# Patient Record
Sex: Female | Born: 1995 | Race: White | Hispanic: No | Marital: Married | Smoking: Never smoker
Health system: Southern US, Community
[De-identification: ages and names within clinical notes are randomized; demographics above are authoritative.]

## PROBLEM LIST (undated history)

## (undated) DIAGNOSIS — R112 Nausea with vomiting, unspecified: Secondary | ICD-10-CM

## (undated) DIAGNOSIS — F419 Anxiety disorder, unspecified: Secondary | ICD-10-CM

## (undated) DIAGNOSIS — R519 Headache, unspecified: Secondary | ICD-10-CM

## (undated) DIAGNOSIS — T7840XA Allergy, unspecified, initial encounter: Secondary | ICD-10-CM

## (undated) DIAGNOSIS — R51 Headache: Secondary | ICD-10-CM

## (undated) HISTORY — DX: Anxiety disorder, unspecified: F41.9

## (undated) HISTORY — DX: Headache, unspecified: R51.9

## (undated) HISTORY — DX: Headache: R51

## (undated) HISTORY — DX: Allergy, unspecified, initial encounter: T78.40XA

## (undated) HISTORY — PX: ANKLE ARTHODESIS W/ ARTHROSCOPY: SUR63

---
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
## 2014-01-15 ENCOUNTER — Emergency Department: Payer: Self-pay | Admitting: Emergency Medicine

## 2014-10-22 ENCOUNTER — Ambulatory Visit: Payer: Self-pay | Admitting: Physician Assistant

## 2015-02-15 ENCOUNTER — Ambulatory Visit: Payer: BC Managed Care – PPO | Admitting: Psychiatry

## 2015-02-22 ENCOUNTER — Ambulatory Visit (INDEPENDENT_AMBULATORY_CARE_PROVIDER_SITE_OTHER): Payer: BC Managed Care – PPO | Admitting: Psychiatry

## 2015-02-22 ENCOUNTER — Encounter: Payer: Self-pay | Admitting: Psychiatry

## 2015-02-22 VITALS — BP 124/84 | HR 91 | Ht 68.25 in | Wt 238.4 lb

## 2015-02-22 DIAGNOSIS — F411 Generalized anxiety disorder: Secondary | ICD-10-CM | POA: Diagnosis not present

## 2015-02-22 DIAGNOSIS — F909 Attention-deficit hyperactivity disorder, unspecified type: Secondary | ICD-10-CM

## 2015-02-22 DIAGNOSIS — F988 Other specified behavioral and emotional disorders with onset usually occurring in childhood and adolescence: Secondary | ICD-10-CM

## 2015-02-22 MED ORDER — FLUOXETINE HCL 10 MG PO CAPS: 10.0000 mg | ORAL_CAPSULE | ORAL | Status: DC

## 2015-02-22 MED ORDER — METHYLPHENIDATE HCL ER (OSM) 27 MG PO TBCR: 27.0000 mg | EXTENDED_RELEASE_TABLET | ORAL | Status: DC

## 2015-02-22 MED ORDER — METHYLPHENIDATE HCL ER 27 MG PO TB24: 27.0000 mg | ORAL_TABLET | Freq: Every day | ORAL | Status: DC

## 2015-02-22 NOTE — Progress Notes (Signed)
BH MD/PA/NP OP Progress Note  02/22/2015 1:32 PM Madison Mack  MRN:  [id number]  Subjective:   Patient is a 19 year old female who presented with her mother for follow-up appointment. She reported that she has been doing well and has been enjoying her summer. She went for be shipped for almost a week. She reported that she ran out of her Concerta 1 week ago as she has to scheduled her appointment. She  reported that she is unable to sleep well as her medications are not adjusted at this time. Her mother reported that she has been responding well to her current psycho topic medications. She did well in the school and got 2 B's and 3 A's. She is currently finishing her education in elementary school. She is very motivated and is planning to go back next months. she currently denied having any suicidal homicidal ideation or plans.     Chief Complaint:  Chief Complaint    Follow-up; Anxiety; ADHD     Visit Diagnosis:     ICD-9-CM ICD-10-CM   1. ADD (attention deficit disorder) 314.00 F90.9   2. Generalized anxiety disorder 300.02 F41.1     Past Medical History:  Past Medical History  Diagnosis Date  . ADHD (attention deficit hyperactivity disorder)     Past Surgical History  Procedure Laterality Date  . Ankle fracture surgery Right 04/08   Family History:  Family History  Problem Relation Age of Onset  . ADD / ADHD Father   . ADD / ADHD Maternal Aunt   . Alcohol abuse Maternal Aunt   . Drug abuse Maternal Aunt   . Anxiety disorder Maternal Aunt   . Alcohol abuse Maternal Uncle   . Drug abuse Maternal Uncle   . Anxiety disorder Paternal Grandfather   . Depression Paternal Grandfather   . Dementia Paternal Grandmother    Social History:  History   Social History  . Marital Status: Single    Spouse Name: N/A  . Number of Children: N/A  . Years of Education: N/A   Social History Main Topics  . Smoking status: Never Smoker   . Smokeless tobacco: Never Used  .  Alcohol Use: No  . Drug Use: No  . Sexual Activity: Not Currently   Other Topics Concern  . None   Social History Narrative  . None   Additional History:     Assessment:   Musculoskeletal: Strength & Muscle Tone: within normal limits Gait & Station: normal Patient leans: N/A  Psychiatric Specialty Exam: HPI  Review of Systems  Constitutional: Negative.   HENT: Negative.   Eyes: Negative.   Respiratory: Negative.   Cardiovascular: Negative.   Gastrointestinal: Negative.   Genitourinary: Negative.   Musculoskeletal: Negative.   Skin: Negative.   Neurological: Negative.   Endo/Heme/Allergies: Negative.   Psychiatric/Behavioral: Negative for suicidal ideas and substance abuse. The patient is nervous/anxious.     Blood pressure 124/84, pulse 91, height 5' 8.25" (1.734 m), weight 238 lb 6.4 oz (108.138 kg), last menstrual period 02/09/2015.Body mass index is 35.97 kg/(m^2).  General Appearance: Casual  Eye Contact:  Fair  Speech:  Clear and Coherent  Volume:  Normal  Mood:  Euthymic  Affect:  Congruent  Thought Process:  Coherent  Orientation:  Full (Time, Place, and Person)  Thought Content:  WDL  Suicidal Thoughts:  No  Homicidal Thoughts:  No  Memory:  Immediate;   Fair  Judgement:  Fair  Insight:  Fair  Psychomotor Activity:  Normal  Concentration:  Fair  Recall:  [employer]  Fund of Knowledge: Fair  Language: Fair  Akathisia:  No  Handed:  Ambidextrous  AIMS (if indicated):    Assets:  Communication Skills Desire for Improvement Physical Health Social Support  ADL's:  Intact  Cognition: WNL  Sleep:     Is the patient at risk to self?  No. Has the patient been a risk to self in the past 6 months?  No. Has the patient been a risk to self within the distant past?  No. Is the patient a risk to others?  No. Has the patient been a risk to others in the past 6 months?  No. Has the patient been a risk to others within the distant past?  No.  Current  Medications: No current outpatient prescriptions on file.   No current facility-administered medications for this visit.    Medical Decision Making:  Established Problem, Stable/Improving (1) and Review of Psycho-Social Stressors (1)  Treatment Plan Summary:Medication management  She was given 3 month supply of her prescriptions including Concerta and she will fill them on a monthly basis. She was given 90 day supply of Prozac. Patient will follow up in October or earlier depending on her symptoms.   More than 50% of the time spent in psychoeducation, counseling and coordination of care.    This note was generated in part or whole with voice recognition software. Voice regonition is usually quite accurate but there are transcription errors that can and very often do occur. I apologize for any typographical errors that were not detected and corrected.     Madison HaleUzma Arlanda Mack 02/22/2015, 1:32 PM

## 2015-02-28 ENCOUNTER — Telehealth: Payer: Self-pay

## 2015-02-28 NOTE — Telephone Encounter (Signed)
faxed request for prior authorization for [facility] on medication methyphenidate 27mg  was approved by insurance from 02-05-15 thur 02-26-16 aut # [id number]

## 2015-05-24 ENCOUNTER — Ambulatory Visit (INDEPENDENT_AMBULATORY_CARE_PROVIDER_SITE_OTHER): Payer: BC Managed Care – PPO | Admitting: Psychiatry

## 2015-05-24 ENCOUNTER — Encounter: Payer: Self-pay | Admitting: Psychiatry

## 2015-05-24 VITALS — BP 118/78 | HR 87 | Temp 97.4°F | Ht 68.5 in | Wt 238.0 lb

## 2015-05-24 DIAGNOSIS — F988 Other specified behavioral and emotional disorders with onset usually occurring in childhood and adolescence: Secondary | ICD-10-CM

## 2015-05-24 DIAGNOSIS — F909 Attention-deficit hyperactivity disorder, unspecified type: Secondary | ICD-10-CM

## 2015-05-24 MED ORDER — FLUOXETINE HCL 10 MG PO CAPS: 10.0000 mg | ORAL_CAPSULE | ORAL | Status: DC

## 2015-05-24 MED ORDER — METHYLPHENIDATE HCL ER (OSM) 27 MG PO TBCR: 27.0000 mg | EXTENDED_RELEASE_TABLET | ORAL | Status: DC
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
## 2015-07-19 ENCOUNTER — Ambulatory Visit (INDEPENDENT_AMBULATORY_CARE_PROVIDER_SITE_OTHER): Payer: BC Managed Care – PPO | Admitting: Psychiatry

## 2015-07-19 ENCOUNTER — Ambulatory Visit: Payer: BC Managed Care – PPO | Admitting: Psychiatry

## 2015-07-19 ENCOUNTER — Encounter: Payer: Self-pay | Admitting: Psychiatry

## 2015-07-19 VITALS — BP 108/80 | HR 103 | Temp 99.1°F | Ht 68.5 in | Wt 230.2 lb

## 2015-07-19 DIAGNOSIS — F419 Anxiety disorder, unspecified: Secondary | ICD-10-CM

## 2015-07-19 DIAGNOSIS — F909 Attention-deficit hyperactivity disorder, unspecified type: Secondary | ICD-10-CM

## 2015-07-19 DIAGNOSIS — F988 Other specified behavioral and emotional disorders with onset usually occurring in childhood and adolescence: Secondary | ICD-10-CM

## 2015-07-19 MED ORDER — FLUOXETINE HCL 10 MG PO CAPS: 10.0000 mg | ORAL_CAPSULE | ORAL | Status: DC

## 2015-07-19 MED ORDER — METHYLPHENIDATE HCL ER (OSM) 27 MG PO TBCR: 27.0000 mg | EXTENDED_RELEASE_TABLET | ORAL | Status: DC

## 2015-07-19 MED ORDER — METHYLPHENIDATE HCL ER (OSM) 27 MG PO TBCR
27.0000 mg | EXTENDED_RELEASE_TABLET | ORAL | Status: DC
Start: 2015-07-19 — End: 2015-09-28
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
## 2015-08-02 ENCOUNTER — Ambulatory Visit
Admission: EM | Admit: 2015-08-02 | Discharge: 2015-08-02 | Disposition: A | Discharge status: Home / Self Care | Payer: BC Managed Care – PPO | Attending: Family Medicine | Admitting: Family Medicine

## 2015-08-02 ENCOUNTER — Encounter: Payer: Self-pay | Admitting: Emergency Medicine

## 2015-08-02 ENCOUNTER — Emergency Department
Admission: EM | Admit: 2015-08-02 | Discharge: 2015-08-02 | Disposition: A | Discharge status: Home / Self Care | Payer: BC Managed Care – PPO | Attending: Emergency Medicine | Admitting: Emergency Medicine

## 2015-08-02 DIAGNOSIS — K529 Noninfective gastroenteritis and colitis, unspecified: Secondary | ICD-10-CM

## 2015-08-02 DIAGNOSIS — E860 Dehydration: Secondary | ICD-10-CM | POA: Diagnosis not present

## 2015-08-02 DIAGNOSIS — Z79899 Other long term (current) drug therapy: Secondary | ICD-10-CM | POA: Diagnosis not present

## 2015-08-02 DIAGNOSIS — Z880 Allergy status to penicillin: Secondary | ICD-10-CM | POA: Diagnosis not present

## 2015-08-02 DIAGNOSIS — Z3202 Encounter for pregnancy test, result negative: Secondary | ICD-10-CM | POA: Diagnosis not present

## 2015-08-02 DIAGNOSIS — R112 Nausea with vomiting, unspecified: Secondary | ICD-10-CM | POA: Diagnosis present

## 2015-08-02 DIAGNOSIS — R55 Syncope and collapse: Secondary | ICD-10-CM | POA: Diagnosis not present

## 2015-08-02 LAB — POCT PREGNANCY, URINE: PREG TEST UR: NEGATIVE

## 2015-08-02 LAB — URINALYSIS COMPLETE WITH MICROSCOPIC (ARMC ONLY)
BACTERIA UA: NONE SEEN
Bilirubin Urine: NEGATIVE
GLUCOSE, UA: NEGATIVE mg/dL
Hgb urine dipstick: NEGATIVE
Leukocytes, UA: NEGATIVE
Nitrite: NEGATIVE
PROTEIN: 100 mg/dL — AB
Specific Gravity, Urine: 1.03 (ref 1.005–1.030)
pH: 7 (ref 5.0–8.0)

## 2015-08-02 LAB — COMPREHENSIVE METABOLIC PANEL
ALT: 14 U/L (ref 14–54)
AST: 13 U/L — AB (ref 15–41)
Albumin: 4.3 g/dL (ref 3.5–5.0)
Alkaline Phosphatase: 91 U/L (ref 38–126)
Anion gap: 7 (ref 5–15)
BILIRUBIN TOTAL: 0.8 mg/dL (ref 0.3–1.2)
BUN: 16 mg/dL (ref 6–20)
CHLORIDE: 105 mmol/L (ref 101–111)
CO2: 21 mmol/L — ABNORMAL LOW (ref 22–32)
Calcium: 9.1 mg/dL (ref 8.9–10.3)
Creatinine, Ser: 0.74 mg/dL (ref 0.44–1.00)
GFR calc Af Amer: >60 mL/min (ref >60)
GLUCOSE: 105 mg/dL — AB (ref 65–99)
Potassium: 4 mmol/L (ref 3.5–5.1)
Sodium: 133 mmol/L — ABNORMAL LOW (ref 135–145)
Total Protein: 8.6 g/dL — ABNORMAL HIGH (ref 6.5–8.1)

## 2015-08-02 LAB — CBC
HEMATOCRIT: 40.4 % (ref 35.0–47.0)
HEMOGLOBIN: 12.7 g/dL (ref 12.0–16.0)
MCH: 23.8 pg — AB (ref 26.0–34.0)
MCHC: 31.5 g/dL — AB (ref 32.0–36.0)
MCV: 75.4 fL — ABNORMAL LOW (ref 80.0–100.0)
Platelets: 257 10*3/uL (ref 150–440)
RBC: 5.36 MIL/uL — ABNORMAL HIGH (ref 3.80–5.20)
RDW: 15.6 % — AB (ref 11.5–14.5)
WBC: 15.1 10*3/uL — ABNORMAL HIGH (ref 3.6–11.0)

## 2015-08-02 LAB — LIPASE, BLOOD: Lipase: 28 U/L (ref 11–51)

## 2015-08-02 MED ORDER — LOPERAMIDE HCL 2 MG PO TABS: 2.0000 mg | ORAL_TABLET | Freq: Four times a day (QID) | ORAL | Status: DC | PRN

## 2015-08-02 MED ORDER — SODIUM CHLORIDE 0.9 % IV BOLUS (SEPSIS)
1000.0000 mL | Freq: Once | INTRAVENOUS | Status: AC
  Administered 2015-08-02: 1000 mL via INTRAVENOUS

## 2015-08-02 MED ORDER — LOPERAMIDE HCL 2 MG PO CAPS
4.0000 mg | ORAL_CAPSULE | Freq: Once | ORAL | Status: AC
  Administered 2015-08-02: 4 mg via ORAL
  Filled 2015-08-02: qty 2

## 2015-08-02 MED ORDER — ONDANSETRON 4 MG PO TBDP: 4.0000 mg | ORAL_TABLET | Freq: Three times a day (TID) | ORAL | Status: DC | PRN

## 2015-08-02 NOTE — ED Notes (Signed)
Woke at 7am with diarrhea and vomiting. "Too many times to count" with vomiting and diarrhea. After drinking any fluids "goes right through". Also co intermittent sharp abdominal pain. Unable to assess orthostatics as felt syncopal. Color pale and skin cool and dry

## 2015-08-02 NOTE — Discharge Instructions (Signed)
Dehydration, Adult Dehydration means your body does not have as much fluid or water as it needs. It happens when you take in less fluid than you lose. Your kidneys, brain, and heart will not work properly without the right amount of fluids.  Dehydration can range from mild to severe. It should be treated right away to help prevent it from becoming severe. HOME CARE  Drink enough fluid to keep your pee (urine) clear or pale yellow.  Drink water or fluid slowly by taking small sips. You can also try sucking on ice cubes.  Have food or drinks that contain electrolytes. Examples include bananas and sports drinks.  Take over-the-counter and prescription medicines only as told by your doctor.  Prepare oral rehydration solution (ORS) according to the instructions that came with it. Take sips of ORS every 5 minutes until your pee returns to normal.  If you are throwing up (vomiting) or have watery poop (diarrhea), keep trying to drink water, ORS, or both.  If you have watery poop, avoid:  Drinks with caffeine.  Fruit juice.  Milk.  Carbonated soft drinks.  Do not take salt tablets. This can lead to having too much sodium in your body (hypernatremia). GET HELP IF:  You cannot eat or drink without throwing up.  You have had mild watery poop for longer than 24 hours.  You have a fever. GET HELP RIGHT AWAY IF:   You have very strong thirst.  You have very bad watery poop.  You have not peed in 6-8 hours, or you have peed only a small amount of very dark pee.  You have shriveled skin.  You are dizzy, confused, or both.   This information is not intended to replace advice given to you by your health care provider. Make sure you discuss any questions you have with your health care provider.   Document Released: 05/24/2009 Document Revised: 04/18/2015 Document Reviewed: 12/13/2014 Elsevier Interactive Patient Education 2016 [employer].  Near-Syncope Near-syncope (commonly  known as near fainting) is sudden weakness, dizziness, or feeling like you might pass out. This can happen when getting up or while standing for a long time. It is caused by a sudden decrease in blood flow to the brain, which can occur for various reasons. Most of the reasons are not serious.  HOME CARE Watch your condition for any changes.  Have someone stay with you until you feel stable.  If you feel like you are going to pass out:  Lie down right away.  Prop your feet up if you can.  Breathe deeply and steadily.  Move only when the feeling has gone away. Most of the time, this feeling lasts only a few minutes. You may feel tired for several hours.  Drink enough fluids to keep your pee (urine) clear or pale yellow.  If you are taking blood pressure or heart medicine, stand up slowly.  Follow up with your doctor as told. GET HELP RIGHT AWAY IF:   You have a severe headache.  You have unusual pain in the chest, belly (abdomen), or back.  You have bleeding from the mouth or butt (rectum), or you have black or tarry poop (stool).  You feel your heart beat differently than normal, or you have a very fast pulse.  You pass out, or you twitch and shake when you pass out.  You pass out when sitting or lying down.  You feel confused.  You have trouble walking.  You are weak.  You  have vision problems. MAKE SURE YOU:   Understand these instructions.  Will watch your condition.  Will get help right away if you are not doing well or get worse.   This information is not intended to replace advice given to you by your health care provider. Make sure you discuss any questions you have with your health care provider.   Document Released: 01/14/2008 Document Revised: 08/18/2014 Document Reviewed: 12/31/2012 Elsevier Interactive Patient Education [employer]2016 Elsevier Inc.

## 2015-08-02 NOTE — Discharge Instructions (Signed)
Syncope °Syncope means a person passes out (faints). The person usually wakes up in less than 5 minutes. It is important to seek medical care for syncope. °HOME CARE °· Have someone stay with you until you feel normal. °· Do not drive, use machines, or play sports until your doctor says it is okay. °· Keep all doctor visits as told. °· Lie down when you feel like you might pass out. Take deep breaths. Wait until you feel normal before standing up. °· Drink enough fluids to keep your pee (urine) clear or pale yellow. °· If you take blood pressure or heart medicine, get up slowly. Take several minutes to sit and then stand. °GET HELP RIGHT AWAY IF:  °· You have a severe headache. °· You have pain in the chest, belly (abdomen), or back. °· You are bleeding from the mouth or butt (rectum). °· You have black or tarry poop (stool). °· You have an irregular or very fast heartbeat. °· You have pain with breathing. °· You keep passing out, or you have shaking (seizures) when you pass out. °· You pass out when sitting or lying down. °· You feel confused. °· You have trouble walking. °· You have severe weakness. °· You have vision problems. °If you fainted, call for help (911 in [place] Do not drive yourself to the hospital. °  °This information is not intended to replace advice given to you by your health care provider. Make sure you discuss any questions you have with your health care provider. °  °Document Released: 01/14/2008 Document Revised: 12/12/2014 Document Reviewed: 09/26/2011 °Elsevier Interactive Patient Education ©2016 Elsevier Inc. ° °

## 2015-08-02 NOTE — ED Notes (Signed)
Pt sent over from [facility] with possible dehydration pt started having n/v/d this am.

## 2015-08-02 NOTE — ED Provider Notes (Signed)
CSN: [id number]     Arrival date & time 08/02/15  1532 History   First MD Initiated Contact with Patient 08/02/15 1630     Chief Complaint  Patient presents with  . Emesis  . Diarrhea   (Consider location/radiation/quality/duration/timing/severity/associated sxs/prior Treatment) HPI  Past Medical History  Diagnosis Date  . ADHD (attention deficit hyperactivity disorder)   . Depression    Past Surgical History  Procedure Laterality Date  . Ankle fracture surgery Right 04/08   Family History  Problem Relation Age of Onset  . Hyperlipidemia Father   . ADD / ADHD Maternal Aunt   . Alcohol abuse Maternal Aunt   . Drug abuse Maternal Aunt   . Anxiety disorder Maternal Aunt   . Alcohol abuse Maternal Uncle   . Drug abuse Maternal Uncle   . Anxiety disorder Paternal Grandfather   . Depression Paternal Grandfather   . Dementia Paternal Grandmother   . Hypertension Mother    Social History  Substance Use Topics  . Smoking status: Never Smoker   . Smokeless tobacco: Never Used  . Alcohol Use: No   OB History    No data available     Review of Systems  Allergies  Penicillins  Home Medications   Prior to Admission medications   Medication Sig Start Date End Date Taking? Authorizing Provider  FLUoxetine (PROZAC) 10 MG capsule Take 1 capsule (10 mg total) by mouth every morning. 07/19/15  Yes [name], MD  methylphenidate 27 MG PO CR tablet Take 1 tablet (27 mg total) by mouth every morning. 07/19/15  Yes [name], MD  methylphenidate 27 MG PO CR tablet Take 1 tablet (27 mg total) by mouth every morning. 07/19/15   [name], MD  ondansetron (ZOFRAN-ODT) 4 MG disintegrating tablet DIS ONE T PO Q 6 HOURS PRF NAUSEA 04/21/15   Historical Provider, MD   Meds Ordered and Administered this Visit  Medications - No data to display  BP 101/69 mmHg  Pulse 98  Temp(Src) 97.5 F (36.4 C) (Tympanic)  Resp 16  Ht 5' 7.5" (1.715 m)  Wt 228 lb (103.42 kg)  BMI 35.16 kg/m2   SpO2 100%  LMP 07/12/2015 (Exact Date) No data found.   Physical Exam  Constitutional: She is oriented to person, place, and time. She appears well-developed. She appears distressed.  HENT:  Head: Normocephalic and atraumatic.  Right Ear: External ear normal.  Left Ear: External ear normal.  Eyes: Conjunctivae are normal. Pupils are equal, round, and reactive to light.  Neck: Normal range of motion. Neck supple.  Cardiovascular: Normal rate, regular rhythm and normal heart sounds.   Pulmonary/Chest: Effort normal and breath sounds normal.  Abdominal: Soft. Bowel sounds are normal.  Musculoskeletal: Normal range of motion.  Neurological: She is alert and oriented to person, place, and time.  Skin: Skin is warm. She is diaphoretic.  Psychiatric: She has a normal mood and affect. Her behavior is normal.  Vitals reviewed.   ED Course  Procedures (including critical care time)  Labs Review Labs Reviewed - No data to display  Imaging Review No results found.   Visual Acuity Review  Right Eye Distance:   Left Eye Distance:   Bilateral Distance:    Right Eye Near:   Left Eye Near:    Bilateral Near:         MDM   1. Dehydration   2. Acute gastroenteritis    Patient with prominent orthostatic changes. Discussed mother that with abdominal  pain she's having a marked orthostatic changes as well will be limited we can do here. We'll give up to 2 L of fluid get lab work but if anything is found abnormal lab work is significant or after 2 L of fluid she's not improving we will then need to transfer [facility] for evaluation. Since it is a good chance that she will be transferred to [facility] ED later mother asked if she can just go on there now to be seen and evaluated.  Offered call EMS and patient requested EMS as well mother reroute her and told her that she will take her by car.    Hassan RowanEugene Myia Bergh, MD 08/02/15 [id number]
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
## 2015-08-02 NOTE — ED Provider Notes (Signed)
[facility] Emergency Department Provider Note  Time seen: 6:14 PM  I have reviewed the triage vital signs and the nursing notes.   HISTORY  Chief Complaint Nausea; Emesis; and Diarrhea    HPI Madison Mack is a 18 y.o. female with a past medical history of ADHD and depression who presents the emergency department with nausea, vomiting, diarrhea and syncopal episodes. According to the patient she awoke this morning with nausea and vomiting followed by profuse watery diarrhea. He continues to feel nauseated with vomiting at times, states constant diarrhea. Upon presentation to the emergency department the patient had 2 syncopal episodes and was brought back to a room immediately. Patient initially pale. Denies "pain" but states she has abdominal cramping. Feels nauseated. States this feels like when she had noro virus 2 years ago.     Past Medical History  Diagnosis Date  . ADHD (attention deficit hyperactivity disorder)   . Depression     There are no active problems to display for this patient.   Past Surgical History  Procedure Laterality Date  . Ankle fracture surgery Right 04/08    Current Outpatient Rx  Name  Route  Sig  Dispense  Refill  . FLUoxetine (PROZAC) 10 MG capsule   Oral   Take 1 capsule (10 mg total) by mouth every morning.   90 capsule   1   . methylphenidate 27 MG PO CR tablet   Oral   Take 1 tablet (27 mg total) by mouth every morning.   30 tablet   0     To be filled after 06/25/15   . omeprazole (PRILOSEC) 20 MG capsule   Oral   Take 20 mg by mouth daily.           Allergies Penicillins  Family History  Problem Relation Age of Onset  . Hyperlipidemia Father   . ADD / ADHD Maternal Aunt   . Alcohol abuse Maternal Aunt   . Drug abuse Maternal Aunt   . Anxiety disorder Maternal Aunt   . Alcohol abuse Maternal Uncle   . Drug abuse Maternal Uncle   . Anxiety disorder Paternal Grandfather   . Depression  Paternal Grandfather   . Dementia Paternal Grandmother   . Hypertension Mother     Social History Social History  Substance Use Topics  . Smoking status: Never Smoker   . Smokeless tobacco: Never Used  . Alcohol Use: No    Review of Systems Constitutional: Negative for fever Cardiovascular: Negative for chest pain. Respiratory: Negative for shortness of breath. Gastrointestinal: Positive for abdominal cramping, nausea, vomiting, diarrhea Genitourinary: Negative for dysuria. Neurological: Negative for headache 10-point ROS otherwise negative.  ____________________________________________   PHYSICAL EXAM:  VITAL SIGNS: ED Triage Vitals  Enc Vitals Group     BP 08/02/15 1705 113/60 mmHg     Pulse Rate 08/02/15 1705 121     Resp 08/02/15 1705 20     Temp 08/02/15 1705 98.1 F (36.7 C)     Temp Source 08/02/15 1705 Oral     SpO2 08/02/15 1705 100 %     Weight 08/02/15 1705 228 lb (103.42 kg)     Height 08/02/15 1705  (1.702 m)     Head Cir --      Peak Flow --      Pain Score 08/02/15 1706 5     Pain Loc --      Pain Edu? --      Excl.  in GC? --     Constitutional: Alert and oriented. Well appearing and in no distress. Eyes: Normal exam ENT   Head: Normocephalic and atraumatic.   Mouth/Throat: Mucous membranes are moist. Cardiovascular: Normal rate, regular rhythm. No murmur Respiratory: Normal respiratory effort without tachypnea nor retractions. Breath sounds are clear Gastrointestinal: Soft and nontender. No distention.  Musculoskeletal: Nontender with normal range of motion in all extremities.  Neurologic:  Normal speech and language. No gross focal neurologic deficits  Skin:  Skin is warm, dry and intact.  Psychiatric: Mood and affect are normal. Speech and behavior are normal.  ____________________________________________    EKG  EKG reviewed and interpreted by myself shows normal sinus rhythm at 94 bpm, narrow QRS, normal axis, normal  intervals, no ST changes. Overall normal EKG.  ____________________________________________   INITIAL IMPRESSION / ASSESSMENT AND PLAN / ED COURSE  Pertinent labs & imaging results that were available during my care of the patient were reviewed by me and considered in my medical decision making (see chart for details).  Patient presents with nausea, vomiting, diarrhea and syncopal episodes. Patient's history very suggestive of gastroenteritis such as norovirus. We'll check labs, IV hydrate, and treat nausea, treat diarrhea, and closer monitor in the emergency department. We will obtain an EKG. Nontender abdominal exam.  Labs show an elevated white blood cell count, otherwise largely within normal limits, again likely consistent with gastroenteritis. Patient is feeling much better status post nausea medication and fluids. We'll continue with IV hydration, treated with loperamide, and continue to monitor in the emergency department. Patient has just produced a urine sample for us to test.  Urine within normal limits, Prevacid as negative. Patient feels much better. We'll discharge with Zofran, loperamide as needed, and primary care follow-up. Patient agreeable to plan. ____________________________________________   FINAL CLINICAL IMPRESSION(S) / ED DIAGNOSES  Gastroenteritis Syncope  Minna AntisKevin Hannia Matchett, MD 08/02/15 2041

## 2015-08-07 NOTE — Progress Notes (Signed)
Refilled

## 2015-09-28 ENCOUNTER — Other Ambulatory Visit: Payer: Self-pay

## 2015-09-28 DIAGNOSIS — F988 Other specified behavioral and emotional disorders with onset usually occurring in childhood and adolescence: Secondary | ICD-10-CM

## 2015-09-28 NOTE — Telephone Encounter (Signed)
pt is almost out of her medication, pt will not have enought medication to last until her appt on  10-16-15.  pt is in college and will not be home until the week of march 6-10th.

## 2015-10-04 ENCOUNTER — Telehealth: Payer: Self-pay

## 2015-10-04 MED ORDER — FLUOXETINE HCL 10 MG PO CAPS: 10.0000 mg | ORAL_CAPSULE | ORAL | Status: DC

## 2015-10-04 MED ORDER — METHYLPHENIDATE HCL ER (OSM) 27 MG PO TBCR: 27.0000 mg | EXTENDED_RELEASE_TABLET | ORAL | Status: DC

## 2015-10-04 NOTE — Telephone Encounter (Signed)
rx for methylphenidate 27 mg was faxed and confirmed id # [id number] order # [id number]

## 2015-10-16 ENCOUNTER — Encounter: Payer: Self-pay | Admitting: Psychiatry

## 2015-10-16 ENCOUNTER — Ambulatory Visit (INDEPENDENT_AMBULATORY_CARE_PROVIDER_SITE_OTHER): Payer: BC Managed Care – PPO | Admitting: Psychiatry

## 2015-10-16 VITALS — BP 122/80 | HR 91 | Temp 98.3°F | Ht 67.0 in | Wt 224.8 lb

## 2015-10-16 DIAGNOSIS — F330 Major depressive disorder, recurrent, mild: Secondary | ICD-10-CM | POA: Diagnosis not present

## 2015-10-16 DIAGNOSIS — F988 Other specified behavioral and emotional disorders with onset usually occurring in childhood and adolescence: Secondary | ICD-10-CM

## 2015-10-16 DIAGNOSIS — F909 Attention-deficit hyperactivity disorder, unspecified type: Secondary | ICD-10-CM | POA: Diagnosis not present

## 2015-10-16 MED ORDER — METHYLPHENIDATE HCL ER (OSM) 27 MG PO TBCR: 27.0000 mg | EXTENDED_RELEASE_TABLET | ORAL | Status: DC

## 2015-10-16 MED ORDER — FLUOXETINE HCL 10 MG PO CAPS: 10.0000 mg | ORAL_CAPSULE | ORAL | Status: DC

## 2015-10-16 NOTE — Progress Notes (Signed)
BH MD/PA/NP OP Progress Note  10/16/2015 9:03 AM Madison Mack  MRN:  [id number]  Subjective:  Patient is a 20 year old female who presented for the follow-up. She reported that she would engage in January and had met the man a month before her engagement. She reported that he has finished high school and she is very excited. They're planning to get married next year after her graduation from her college. She reported that her mother was surprised as she thinks that she is going too fast. She reported that she is doing well in her school and has been getting all A's. 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 [place]. Patient appeared calm and collected during the interview. She reported that she has been compliant with her medications and is not experiencing any adverse effects.  She has been sleeping well. She denied having any mood symptoms anger anxiety or paranoia.     Chief Complaint:  Chief Complaint    Follow-up; Medication Refill     Visit Diagnosis:     ICD-9-CM ICD-10-CM   1. 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 ADHD (attention deficit hyperactivity disorder)   . Depression     Past Surgical History  Procedure Laterality Date  . Ankle fracture surgery Right 04/08   Family History:  Family History  Problem Relation Age of Onset  . Hyperlipidemia Father   . ADD / ADHD Maternal Aunt   . Alcohol abuse Maternal Aunt   . Drug abuse Maternal Aunt   . Anxiety disorder Maternal Aunt   . Alcohol abuse Maternal Uncle   . Drug abuse Maternal Uncle   . Anxiety disorder Paternal Grandfather   . Depression Paternal Grandfather   . Dementia Paternal Grandmother   . Hypertension Mother    Social History:  Social History   Social History  .  Marital Status: Single    Spouse Name: N/A  . Number of Children: N/A  . Years of Education: N/A   Social History Main Topics  . Smoking status: Never Smoker   . Smokeless tobacco: Never Used  . Alcohol Use: No  . Drug Use: No  . Sexual Activity: Not Currently   Other Topics Concern  . None   Social History Narrative   Additional History: Patient currently lives with her family. She is currently a Ship broker at [employer].  Assessment:     Psychiatric Specialty Exam: HPI   ROS   Blood pressure 122/80, pulse 91, temperature 98.3 F (36.8 C), temperature source Tympanic, height 5' 7"  (1.702 m), weight 224 lb 12.8 oz (101.969 kg), last menstrual period 10/02/2015, SpO2 97 %.Body mass index is 35.2 kg/(m^2).  General Appearance: Casual  Eye Contact:  Fair  Speech:  Clear and Coherent and Normal Rate  Volume:  Normal  Mood:  Euthymic  Affect:  Congruent  Thought Process:  Coherent and Goal Directed  Orientation:  Full (Time, Place, and Person)  Thought Content:  WDL  Suicidal Thoughts:  No  Homicidal Thoughts:  No  Memory:  Immediate;   Good Recent;   Good  Judgement:  Good  Insight:  Good  Psychomotor Activity:  Normal  Concentration:  Good  Recall:  Good  Fund of Knowledge: Good  Language: Good  Akathisia:  No  Handed:  Right  AIMS (if indicated):    Assets:  Communication Skills Desire for Improvement Physical Health Social Support Talents/Skills  ADL's:  Intact  Cognition: WNL  Sleep:       Current Medications: Current Outpatient Prescriptions  Medication Sig Dispense Refill  . FLUoxetine (PROZAC) 10 MG capsule Take 1 capsule (10 mg total) by mouth every morning. 90 capsule 0  . methylphenidate 27 MG PO CR tablet Take 1 tablet (27 mg total) by mouth every morning. 30 tablet 0   No current facility-administered medications for this visit.    Medical Decision Making:  Established Problem, Stable/Improving (1)  Treatment Plan Summary:Medication  management   Anxiety Patient will continue on Prozac 10 mg by mouth daily  ADD She will continue on Concerta 27 mg 1 pill in the morning. 2 month supply of the medication was given  Follow-up 2 months or earlier depending on her symptoms   More than 50% of the time spent in psychoeducation, counseling and coordination of care.  Time spent with the patient 25 minutes    This note was generated in part or whole with voice recognition software. Voice regonition is usually quite accurate but there are transcription errors that can and very often do occur. I apologize for any typographical errors that were not detected and corrected.     Rainey Pines, MD  10/16/2015, 9:03 AM

## 2016-01-09 ENCOUNTER — Encounter: Payer: Self-pay | Admitting: Psychiatry

## 2016-01-09 ENCOUNTER — Ambulatory Visit (INDEPENDENT_AMBULATORY_CARE_PROVIDER_SITE_OTHER): Payer: BC Managed Care – PPO | Admitting: Psychiatry

## 2016-01-09 VITALS — BP 110/82 | HR 96 | Temp 98.5°F | Ht 67.0 in | Wt 224.2 lb

## 2016-01-09 DIAGNOSIS — F988 Other specified behavioral and emotional disorders with onset usually occurring in childhood and adolescence: Secondary | ICD-10-CM

## 2016-01-09 DIAGNOSIS — F909 Attention-deficit hyperactivity disorder, unspecified type: Secondary | ICD-10-CM

## 2016-01-09 DIAGNOSIS — F330 Major depressive disorder, recurrent, mild: Secondary | ICD-10-CM | POA: Diagnosis not present

## 2016-01-09 MED ORDER — METHYLPHENIDATE HCL ER (OSM) 27 MG PO TBCR: 27.0000 mg | EXTENDED_RELEASE_TABLET | ORAL | Status: DC

## 2016-01-09 NOTE — Progress Notes (Signed)
BH MD/PA/NP OP Progress Note  01/09/2016 4:02 PM Madison Mack  MRN:  [id number]  Subjective:  Patient is a 20 year old female who presented for the follow-up. She reported that she has finished her school and now she is just going to start working full-time in [employer]. Patient reported that she has been doing well. She has been compliant with her medication. She was not able to sleep well last night. She reported that she will be a senior next year and is also going to prepare for her wedding. She will get married after finishing her school. She currently denied having any side effects from her medications. She appeared happy and calm during the interview. She denied having any perceptual disturbances.  She has been sleeping well. She denied having any mood symptoms anger anxiety or paranoia.     Chief Complaint:  Chief Complaint    Follow-up; Medication Refill     Visit Diagnosis:     ICD-9-CM ICD-10-CM   1. 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 ADHD (attention deficit hyperactivity disorder)   . Depression     Past Surgical History  Procedure Laterality Date  . Ankle fracture surgery Right 04/08   Family History:  Family History  Problem Relation Age of Onset  . Hyperlipidemia Father   . ADD / ADHD Maternal Aunt   . Alcohol abuse Maternal Aunt   . Drug abuse Maternal Aunt   . Anxiety disorder Maternal Aunt   . Alcohol abuse Maternal Uncle   . Drug abuse Maternal Uncle   . Anxiety disorder Paternal Grandfather   . Depression Paternal Grandfather   . Dementia Paternal Grandmother   . Hypertension Mother    Social History:  Social History   Social History  . Marital Status: Single    Spouse Name: N/A  . Number of Children: N/A  . Years of Education: N/A   Social History Main Topics  . Smoking status: Never Smoker   . Smokeless tobacco: Never Used  . Alcohol Use:  No  . Drug Use: No  . Sexual Activity: Not Currently   Other Topics Concern  . None   Social History Narrative   Additional History: Patient currently lives with her family. 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.  Assessment:     Psychiatric Specialty Exam: HPI  ROS  Blood pressure 110/82, pulse 96, temperature 98.5 F (36.9 C), temperature source Tympanic, height 5\' 7"  (1.702 m), weight 224 lb 3.2 oz (101.696 kg), last menstrual period 01/02/2016, SpO2 97 %.Body mass index is 35.11 kg/(m^2).  General Appearance: Casual  Eye Contact:  Fair  Speech:  Clear and Coherent and Normal Rate  Volume:  Normal  Mood:  Euthymic  Affect:  Congruent  Thought Process:  Coherent and Goal Directed  Orientation:  Full (Time, Place, and Person)  Thought Content:  WDL  Suicidal Thoughts:  No  Homicidal Thoughts:  No  Memory:  Immediate;   Good Recent;   Good  Judgement:  Good  Insight:  Good  Psychomotor Activity:  Normal  Concentration:  Good  Recall:  Good  Fund of Knowledge: Good  Language: Good  Akathisia:  No  Handed:  Right  AIMS (if indicated):    Assets:  Communication Skills Desire for Improvement Physical Health Social Support Talents/Skills  ADL's:  Intact  Cognition: WNL  Sleep:  Current Medications: Current Outpatient Prescriptions  Medication Sig Dispense Refill  . FLUoxetine (PROZAC) 10 MG capsule Take 1 capsule (10 mg total) by mouth every morning. 90 capsule 1  . methylphenidate 27 MG PO CR tablet Take 1 tablet (27 mg total) by mouth every morning. 30 tablet 0  . methylphenidate 27 MG PO CR tablet Take 1 tablet (27 mg total) by mouth every morning. 30 tablet 0   No current facility-administered medications for this visit.    Medical Decision Making:  Established Problem, Stable/Improving (1)  Treatment Plan Summary:Medication management   Anxiety Patient will continue on Prozac 10 mg by mouth daily  ADD She will continue on Concerta 27  mg 1 pill in the morning. 2 month supply of the medication was given  Follow-up 3 months or earlier depending on her symptoms   More than 50% of the time spent in psychoeducation, counseling and coordination of care.  Time spent with the patient 25 minutes    This note was generated in part or whole with voice recognition software. Voice regonition is usually quite accurate but there are transcription errors that can and very often do occur. I apologize for any typographical errors that were not detected and corrected.     [name], MD  01/09/2016, 4:02 PM

## 2016-03-10 ENCOUNTER — Encounter: Payer: Self-pay | Admitting: Psychiatry

## 2016-03-10 ENCOUNTER — Ambulatory Visit (INDEPENDENT_AMBULATORY_CARE_PROVIDER_SITE_OTHER): Payer: BC Managed Care – PPO | Admitting: Psychiatry

## 2016-03-10 VITALS — BP 127/81 | HR 84 | Temp 99.1°F | Ht 67.0 in | Wt 228.4 lb

## 2016-03-10 DIAGNOSIS — F330 Major depressive disorder, recurrent, mild: Secondary | ICD-10-CM

## 2016-03-10 MED ORDER — FLUOXETINE HCL 10 MG PO CAPS: 10.0000 mg | ORAL_CAPSULE | Freq: Every day | ORAL | 1 refills | Status: DC

## 2016-03-10 NOTE — Progress Notes (Signed)
BH MD/PA/NP OP Progress Note  03/10/2016 10:04 AM Madison Mack  MRN:  [id number]  Subjective:  Patient is a 20 year old female who currently [redacted] weeks pregnant presented for follow-up. She reported that she found out 2 weeks ago that she is pregnant and her parents were upset. However her mother is fine now with her pregnancy. Stopped taking the medications 2 weeks ago and she wants to discuss about her medications. She reported that she feels tired in the morning. She is doing well in her pregnancy and will have her first prenatal appointment in a week. She reported that she is excited about her pregnancy and is looking forward to have her first baby in April and will get married in May. She is also in her senior year of school and will finish can graduate in May. She has her plans for the next year. She appeared calm and alert during the interview. She reported that she has gained 3 pounds so far. She denied having any suicidal ideations or plans. She reported that she is feeling well and is sleeping good at night. We discussed about the medications. She wants to restart the Prozac at this time.   She  denied having any paranoia or anxiety or depressive symptoms.     Chief Complaint:  Chief Complaint    Medication Problem; Other     Visit Diagnosis:     ICD-9-CM ICD-10-CM   1. 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 ADHD (attention deficit hyperactivity disorder)   . Depression     Past Surgical History:  Procedure Laterality Date  . ANKLE FRACTURE SURGERY Right 04/08   Family History:  Family History  Problem Relation Age of Onset  . Hyperlipidemia Father   . Hypertension Mother   . ADD / ADHD Maternal Aunt   . Alcohol abuse Maternal Aunt   . Drug abuse Maternal Aunt   . Anxiety disorder Maternal Aunt   . Alcohol abuse Maternal Uncle   . Drug abuse Maternal Uncle   . Anxiety  disorder Paternal Grandfather   . Depression Paternal Grandfather   . Dementia Paternal Grandmother    Social History:  Social History   Social History  . Marital status: Single    Spouse name: N/A  . Number of children: N/A  . Years of education: N/A   Social History Main Topics  . Smoking status: Never Smoker  . Smokeless tobacco: Never Used  . Alcohol use No  . Drug use: No  . Sexual activity: Yes   Other Topics Concern  . None   Social History Narrative  . None   Additional History: Patient currently lives with her family. She is currently a Consulting civil engineer at [employer].  Assessment:     Psychiatric Specialty Exam: HPI  ROS  Blood pressure 127/81, pulse 84, temperature 99.1 F (37.3 C), temperature source Oral, height 5\' 7"  (1.702 m), weight 228 lb 6.4 oz (103.6 kg), last menstrual period 02/01/2016.Body mass index is 35.77 kg/m.  General Appearance: Casual  Eye Contact:  Fair  Speech:  Clear and Coherent and Normal Rate  Volume:  Normal  Mood:  Euthymic  Affect:  Congruent  Thought Process:  Coherent and Goal Directed  Orientation:  Full (Time, Place, and Person)  Thought Content:  WDL  Suicidal Thoughts:  No  Homicidal Thoughts:  No  Memory:  Immediate;  Good Recent;   Good  Judgement:  Good  Insight:  Good  Psychomotor Activity:  Normal  Concentration:  Good  Recall:  Good  Fund of Knowledge: Good  Language: Good  Akathisia:  No  Handed:  Right  AIMS (if indicated):    Assets:  Communication Skills Desire for Improvement Physical Health Social Support Talents/Skills  ADL's:  Intact  Cognition: WNL  Sleep:       Current Medications: Current Outpatient Prescriptions  Medication Sig Dispense Refill  . FLUoxetine (PROZAC) 10 MG capsule Take 1 capsule (10 mg total) by mouth daily. 90 capsule 1   No current facility-administered medications for this visit.     Medical Decision Making:  Established Problem, Stable/Improving (1)  Treatment  Plan Summary:Medication management   Anxiety Patient will continue on Prozac 10 mg by mouth daily  I will discontinue Concerta.  She will follow-up in 2 months or earlier depending on her symptoms     More than 50% of the time spent in psychoeducation, counseling and coordination of care.  Time spent with the patient 25 minutes    This note was generated in part or whole with voice recognition software. Voice regonition is usually quite accurate but there are transcription errors that can and very often do occur. I apologize for any typographical errors that were not detected and corrected.     [name], MD  03/10/2016, 10:04 AM

## 2016-04-21 LAB — HM HIV SCREENING LAB: HM HIV Screening: NEGATIVE

## 2017-06-29 ENCOUNTER — Encounter: Payer: Self-pay | Admitting: Psychiatry

## 2017-06-29 ENCOUNTER — Other Ambulatory Visit: Payer: Self-pay

## 2017-06-29 ENCOUNTER — Ambulatory Visit (INDEPENDENT_AMBULATORY_CARE_PROVIDER_SITE_OTHER): Payer: BC Managed Care – PPO | Admitting: Psychiatry

## 2017-06-29 VITALS — BP 125/83 | HR 80 | Temp 98.4°F | Wt 266.6 lb

## 2017-06-29 DIAGNOSIS — F330 Major depressive disorder, recurrent, mild: Secondary | ICD-10-CM

## 2017-06-29 DIAGNOSIS — F900 Attention-deficit hyperactivity disorder, predominantly inattentive type: Secondary | ICD-10-CM | POA: Diagnosis not present

## 2017-06-29 MED ORDER — FLUOXETINE HCL 10 MG PO CAPS: 10.0000 mg | ORAL_CAPSULE | Freq: Every day | ORAL | 2 refills | Status: DC

## 2017-06-29 MED ORDER — METHYLPHENIDATE HCL ER (OSM) 27 MG PO TBCR: 27.0000 mg | EXTENDED_RELEASE_TABLET | ORAL | 0 refills | Status: DC

## 2017-06-29 NOTE — Progress Notes (Signed)
Psychiatric Initial Adult Assessment   Patient Identification: Madison Mack MRN:  [id number] Date of Evaluation:  06/29/2017 Referral Source: Previous Pt Chief Complaint:   Chief Complaint    Follow-up     Visit Diagnosis:    ICD-10-CM   1. MDD (major depressive disorder), recurrent episode, mild ([place]) F33.0   2. Attention deficit hyperactivity disorder (ADHD), predominantly inattentive type F90.0     History of Present Illness:    Patient is a 21 year old married female who presented for initial assessment. She was previously seen in July 2017. She reported that she got married in March 03 and had a baby on March 15. She reported that she is currently working full-time as a third Land and her mother is helping her with her 39 month old son. She reported that she is stressed out due to her job and coming back from work and trying to take care of the baby. Her husband is also very supportive. Patient reported that she was taking the Prozac in the past which was very helpful. She met her husband on line. She reported that she feels that she needs to restart taking her medications again as she is unable to focus at work. Patient currently denied having any suicidal ideations or plans.  She reported that she is currently living with her parents as well as with her husband and her son. Her husband works in [employer]rop[employer] and they are trying to move into a trailer home with their son. Patient stated that she sleeps well at night. Her son does not wake up at night. She stated that she is planning to have more kids but not at this time.  Patient was receptive during the interview. She appeared calm and alert. She denied using any drugs or alcohol. She denied having any suicidal homicidal ideations or plans.    Associated Signs/Symptoms: Depression Symptoms:  fatigue, difficulty concentrating, panic attacks, loss of energy/fatigue, (Hypo) Manic Symptoms:  Irritable  Mood, Labiality of Mood, Anxiety Symptoms:  Excessive Worry, anxiety, rash due to stress  Psychotic Symptoms:  none PTSD Symptoms: Negative NA  Past Psychiatric History: h/o Depression and ADHD  Previous Psychotropic Medications:  Prozac Concerta  Substance Abuse History in the last 12 months:  No.  Consequences of Substance Abuse: Negative NA  Past Medical History:  Past Medical History:  Diagnosis Date  . ADHD (attention deficit hyperactivity disorder)   . Depression     Past Surgical History:  Procedure Laterality Date  . ANKLE FRACTURE SURGERY Right 04/08    Family Psychiatric History:  H/o depression and anxiety  Family History:  Family History  Problem Relation Age of Onset  . Hyperlipidemia Father   . Hypertension Mother   . ADD / ADHD Maternal Aunt   . Alcohol abuse Maternal Aunt   . Drug abuse Maternal Aunt   . Anxiety disorder Maternal Aunt   . Alcohol abuse Maternal Uncle   . Drug abuse Maternal Uncle   . Anxiety disorder Paternal Grandfather   . Depression Paternal Grandfather   . Dementia Paternal Grandmother     Social History:   Social History   Socioeconomic History  . Marital status: Married    Spouse name: thomas  . Number of children: 1  . Years of education: None  . Highest education level: Bachelor's degree (e.g., BA, AB, BS)  Social Needs  . Financial resource strain: Not hard at all  . Food insecurity - worry: Never true  . Food  insecurity - inability: Never true  . Transportation needs - medical: No  . Transportation needs - non-medical: No  Occupational History  . Occupation: [employer]    Comment: full time  Tobacco Use  . Smoking status: Never Smoker  . Smokeless tobacco: Never Used  Substance and Sexual Activity  . Alcohol use: No    Alcohol/week: 0.0 oz  . Drug use: No  . Sexual activity: Yes  Other Topics Concern  . None  Social History Narrative  . None    Additional Social History:  Married. Has a  98 month old son.   Allergies:   Allergies  Allergen Reactions  . Penicillins Rash    Has patient had a PCN reaction causing immediate rash, facial/tongue/throat swelling, SOB or lightheadedness with hypotension: SWELLING FEET AND HANDS, HIVES Has patient had a PCN reaction causing severe rash involving mucus membranes or skin necrosis: NO Has patient had a PCN reaction that required hospitalization: NO Has patient had a PCN reaction occurring within the last 10 years: NO If all of the above answers are "NO", then may proceed with Cephalosporin use.     Metabolic Disorder Labs: No results found for: HGBA1C, MPG No results found for: PROLACTIN No results found for: CHOL, TRIG, HDL, CHOLHDL, VLDL, LDLCALC   Current Medications: Current Outpatient Medications  Medication Sig Dispense Refill  . FLUoxetine (PROZAC) 10 MG capsule Take 1 capsule (10 mg total) daily by mouth. 30 capsule 2  . methylphenidate (CONCERTA) 27 MG PO CR tablet Take 1 tablet (27 mg total) every morning by mouth. 30 tablet 0   No current facility-administered medications for this visit.     Neurologic: Headache: No Seizure: No Paresthesias:No  Musculoskeletal: Strength & Muscle Tone: within normal limits Gait & Station: normal Patient leans: N/A  Psychiatric Specialty Exam: Review of Systems  Neurological: Positive for headaches.  Psychiatric/Behavioral: Positive for depression.    Blood pressure 125/83, pulse 80, temperature 98.4 F (36.9 C), temperature source Oral, weight 266 lb 9.6 oz (120.9 kg), unknown if currently breastfeeding.Body mass index is 41.76 kg/m.  General Appearance: Casual  Eye Contact:  Fair  Speech:  Clear and Coherent  Volume:  Normal  Mood:  Euthymic  Affect:  Congruent  Thought Process:  Coherent and Goal Directed  Orientation:  Full (Time, Place, and Person)  Thought Content:  WDL  Suicidal Thoughts:  No  Homicidal Thoughts:  No  Memory:  Immediate;   Fair Recent;    Fair Remote;   Fair  Judgement:  Fair  Insight:  Fair  Psychomotor Activity:  Normal  Concentration:  Concentration: Fair and Attention Span: Fair  Recall:  [employer] of Knowledge:Fair  Language: Fair  Akathisia:  No  Handed:  Right  AIMS (if indicated):    Assets:  Communication Skills Desire for Improvement Physical Health  ADL's:  Intact  Cognition: WNL  Sleep:      Treatment Plan Summary: Medication management    I will restart her on Prozac 10 mg by mouth daily. I will start her on Concerta 27 mg by mouth daily. Discussed with her about the medications and she agreed with the plan.   More than 50% of the time spent in psychoeducation, counseling and coordination of care.    This note was generated in part or whole with voice recognition software. Voice regonition is usually quite accurate but there are transcription errors that can and very often do occur. I apologize for any typographical  errors that were not detected and corrected.      Rainey Pines, MD 11/19/20183:41 PM

## 2017-07-20 ENCOUNTER — Ambulatory Visit: Payer: BC Managed Care – PPO | Admitting: Psychiatry

## 2017-08-10 ENCOUNTER — Other Ambulatory Visit: Payer: Self-pay

## 2017-08-10 ENCOUNTER — Encounter: Payer: Self-pay | Admitting: Psychiatry

## 2017-08-10 ENCOUNTER — Ambulatory Visit (INDEPENDENT_AMBULATORY_CARE_PROVIDER_SITE_OTHER): Payer: BC Managed Care – PPO | Admitting: Psychiatry

## 2017-08-10 VITALS — BP 118/81 | HR 85 | Temp 98.4°F | Ht 68.0 in | Wt 264.8 lb

## 2017-08-10 DIAGNOSIS — F411 Generalized anxiety disorder: Secondary | ICD-10-CM

## 2017-08-10 DIAGNOSIS — F900 Attention-deficit hyperactivity disorder, predominantly inattentive type: Secondary | ICD-10-CM

## 2017-08-10 DIAGNOSIS — F330 Major depressive disorder, recurrent, mild: Secondary | ICD-10-CM

## 2017-08-10 MED ORDER — METHYLPHENIDATE HCL ER (OSM) 36 MG PO TBCR: 36.0000 mg | EXTENDED_RELEASE_TABLET | ORAL | 0 refills | Status: DC

## 2017-08-10 MED ORDER — FLUOXETINE HCL 20 MG PO CAPS: 20.0000 mg | ORAL_CAPSULE | Freq: Every day | ORAL | 2 refills | Status: DC

## 2017-08-10 NOTE — Progress Notes (Signed)
BH MD OP Progress Note  08/10/2017 4:38 PM Madison Mack  MRN:  [id number]  Chief Complaint: ' I am anxious and needs help to focus.'  Chief Complaint    Follow-up; Medication Refill     HPI: Madison Mack is a 21 year old Caucasian female, married, who lives in [place], has a history of ADHD, depression, anxiety, presented to the clinic today for a follow-up visit.  Madison Mack today reports that she feels her Concerta at this dose is not really helpful.  She feels like  the effect has been wearing off towards the end of the day.  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  She wonders whether increasing her dose will be helpful.  She denies any side effects to the Concerta.  She denies any extreme anxiety issues, restlessness, sleep issues or decreased appetite at this time.  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  She worries about everything to the extreme and that has been she has been all her life.  She reports her Prozac has been helpful but she is okay with increasing the dose.  She not breast-feeding at this time.  She also reports her depressive symptoms have been better on the Prozac at this dose.  She denies any sleep issues.  She denies any suicidality or homicidality.  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  Her husband continues to work at [employer].  She reports she does not get a lot of time to spend with her husband because their schedules are different.  She worries about that.  She however enjoys her time with her 4751-month-old boy ' Madison Mack.'  Parents continues to be supportive.   Visit Diagnosis:    ICD-10-CM   1. Attention deficit hyperactivity disorder (ADHD), predominantly inattentive type F90.0 methylphenidate (CONCERTA) 36 MG PO CR tablet  2. MDD (major depressive disorder), recurrent episode, mild ([facility]) F33.0  FLUoxetine (PROZAC) 20 MG capsule  3. GAD (generalized anxiety disorder) F41.1     Past Psychiatric History: Denies history of suicide attempts.  She denies any history of inpatient mental health admission.  She used to follow up with Dr. Garnetta BuddyFaheem here in clinic.  Past Medical History:  Past Medical History:  Diagnosis Date  . ADHD (attention deficit hyperactivity disorder)   . Depression     Past Surgical History:  Procedure Laterality Date  . ANKLE FRACTURE SURGERY Right 04/08    Family Psychiatric History: Paternal grandfather-depression, maternal aunt-ADHD, alcohol abuse, drug abuse, anxiety disorder, maternal uncle-alcohol abuse, maternal uncle-drug abuse.  Family History:  Family History  Problem Relation Age of Onset  . Hyperlipidemia Father   . Hypertension Mother   . ADD / ADHD Maternal Aunt   . Alcohol abuse Maternal Aunt   . Drug abuse Maternal Aunt   . Anxiety disorder Maternal Aunt   . Alcohol abuse Maternal Uncle   . Drug abuse Maternal Uncle   . Anxiety disorder Paternal Grandfather   . Depression Paternal Grandfather   . Dementia Paternal Grandmother     Social History: She lives in [place] with her husband and her 3051-month-old baby.  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  She has good relationship with her parents.  Has a  bachelor's degree from [employer]. Social History   Socioeconomic History  . Marital  status: Married    Spouse name: thomas  . Number of children: 1  . Years of education: None  . Highest education level: Bachelor's degree (e.g., BA, AB, BS)  Social Needs  . Financial resource strain: Not hard at all  . Food insecurity - worry: Never true  . Food insecurity - inability: Never true  . Transportation needs - medical: No  . Transportation needs - non-medical: No  Occupational History  . Occupation: [employer]    Comment: full time  Tobacco Use  . Smoking status: Never Smoker  . Smokeless tobacco: Never Used  Substance and  Sexual Activity  . Alcohol use: No    Alcohol/week: 0.0 oz  . Drug use: No  . Sexual activity: Yes  Other Topics Concern  . None  Social History Narrative  . None    Allergies:  Allergies  Allergen Reactions  . Penicillins Rash    Has patient had a PCN reaction causing immediate rash, facial/tongue/throat swelling, SOB or lightheadedness with hypotension: SWELLING FEET AND HANDS, HIVES Has patient had a PCN reaction causing severe rash involving mucus membranes or skin necrosis: NO Has patient had a PCN reaction that required hospitalization: NO Has patient had a PCN reaction occurring within the last 10 years: NO If all of the above answers are "NO", then may proceed with Cephalosporin use.     Metabolic Disorder Labs: No results found for: HGBA1C, MPG No results found for: PROLACTIN No results found for: CHOL, TRIG, HDL, CHOLHDL, VLDL, LDLCALC No results found for: TSH  Therapeutic Level Labs: No results found for: LITHIUM No results found for: VALPROATE No components found for:  CBMZ  Current Medications: Current Outpatient Medications  Medication Sig Dispense Refill  . FLUoxetine (PROZAC) 20 MG capsule Take 1 capsule (20 mg total) by mouth daily. 30 capsule 2  . methylphenidate (CONCERTA) 36 MG PO CR tablet Take 1 tablet (36 mg total) by mouth every morning. 30 tablet 0   No current facility-administered medications for this visit.      Musculoskeletal: Strength & Muscle Tone: within normal limits Gait & Station: normal Patient leans: N/A  Psychiatric Specialty Exam: Review of Systems  Psychiatric/Behavioral: Positive for depression. The patient is nervous/anxious.   All other systems reviewed and are negative.   Blood pressure 118/81, pulse 85, temperature 98.4 F (36.9 C), temperature source Oral, height 5\' 8"  (1.727 m), weight 264 lb 12.8 oz (120.1 kg), not currently breastfeeding.Body mass index is 40.26 kg/m.  General Appearance: Casual  Eye Contact:   Fair  Speech:  Normal Rate  Volume:  Normal  Mood:  Anxious  Affect:  Congruent  Thought Process:  Goal Directed and Descriptions of Associations: Intact  Orientation:  Full (Time, Place, and Person)  Thought Content: Logical   Suicidal Thoughts:  No  Homicidal Thoughts:  No  Memory:  Immediate;   Fair Recent;   Fair Remote;   Fair  Judgement:  Fair  Insight:  Fair  Psychomotor Activity:  Normal  Concentration:  Concentration: Fair and Attention Span: Fair  Recall:  [employer] of Knowledge: Fair  Language: Fair  Akathisia:  No  Handed:  Right  AIMS (if indicated): NA  Assets:  Communication Skills Desire for Improvement Financial Resources/Insurance Housing Intimacy Physical Health Social Support Talents/Skills Transportation Vocational/Educational  ADL's:  Intact  Cognition: WNL  Sleep:  Fair   Screenings:   Assessment and Plan: Anique is a 21 year old Caucasian female who has a history of  depression, anxiety, ADD who presented to the clinic for a follow-up visit.  Madison Mack continues to have some attention and concentration issues as well as anxiety problems.  Madison Mack has good social support system, denies substance abuse issues, denies suicidality or homicidality.  Madison Mack also is compliant on her current medications.  She remains a good candidate for outpatient treatment.  Plan For mood symptoms Increase Prozac to 20 mg p.o. Daily PHQ 9 = 3 GAD 7 = 6  For ADHD Increase Concerta to 36 mg p.o. Daily  She will get her TSH from her PMD.   Provided supportive psychotherapy for 10 MINUTES  Reviewed medication/medical records Per Dr. Garnetta BuddyFaheem.  More than 50 % of the time was spent for psychoeducation and supportive psychotherapy and care coordination.  This note was generated in part or whole with voice recognition software. Voice recognition is usually quite accurate but there are transcription errors that can and very often do occur. I apologize for any  typographical errors that were not detected and corrected.        Jomarie LongsSaramma Casy Brunetto, MD 08/10/2017, 4:38 PM

## 2017-09-09 ENCOUNTER — Ambulatory Visit (INDEPENDENT_AMBULATORY_CARE_PROVIDER_SITE_OTHER): Payer: BC Managed Care – PPO | Admitting: Psychiatry

## 2017-09-09 ENCOUNTER — Encounter: Payer: Self-pay | Admitting: Psychiatry

## 2017-09-09 VITALS — BP 165/105 | HR 90 | Ht 68.0 in | Wt 260.0 lb

## 2017-09-09 DIAGNOSIS — F330 Major depressive disorder, recurrent, mild: Secondary | ICD-10-CM | POA: Diagnosis not present

## 2017-09-09 DIAGNOSIS — R030 Elevated blood-pressure reading, without diagnosis of hypertension: Secondary | ICD-10-CM

## 2017-09-09 DIAGNOSIS — F411 Generalized anxiety disorder: Secondary | ICD-10-CM

## 2017-09-09 DIAGNOSIS — F900 Attention-deficit hyperactivity disorder, predominantly inattentive type: Secondary | ICD-10-CM | POA: Diagnosis not present

## 2017-09-09 MED ORDER — FLUOXETINE HCL 20 MG PO CAPS: 20.0000 mg | ORAL_CAPSULE | Freq: Every day | ORAL | 2 refills | Status: DC

## 2017-09-09 MED ORDER — METHYLPHENIDATE HCL ER (OSM) 36 MG PO TBCR: 36.0000 mg | EXTENDED_RELEASE_TABLET | ORAL | 0 refills | Status: DC

## 2017-09-09 NOTE — Progress Notes (Signed)
BH MD/PA/NP OP Progress Note  09/09/2017 4:50 PM Madison Mack  MRN:  [id number]  Chief Complaint: ' I am better.'  Chief Complaint    Follow-up     HPI: Madison Mack is a 22 year old Caucasian female, married, lives in [place], has a history of ADHD, depression, anxiety, presented to the clinic today for a follow-up visit.  [name] today reports that the Concerta has been working well.  She reports that she is able to focus better and the increased dose has actually helped her.  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  She however reports that her current medications are helping her to focus better and keep up with her job.  She denies any side effects to the Concerta.  She denies any sleep issues, appetite changes or extreme restlessness.  She however reports some situational anxiety symptoms on and off.  She reports she is dealing with a lot of stress even outside of work.  She reports they are currently house hunting and that also makes her stressful.  She reports the Prozac is working well.  She denies any side effects to the same.  Her depressive symptoms are currently under control on the Prozac.  She denies any suicidality perceptual disturbances or homicidality.  [name]'s blood pressure today was elevated.  Initially it was noted to be 142/102 and on recheck it went up to 165/105.  Discussed this with [name].  Patient reports that she has a history of increased blood pressure usually in the doctor's office or when she is anxious.  She reports however that she will monitor her blood pressure closely.  Discussed with her to follow-up with PMD.  She agrees with plan.   Visit Diagnosis:    ICD-10-CM   1. Attention deficit hyperactivity disorder (ADHD), predominantly inattentive type F90.0 methylphenidate (CONCERTA) 36 MG PO CR tablet    DISCONTINUED: methylphenidate  (CONCERTA) 36 MG PO CR tablet  2. MDD (major depressive disorder), recurrent episode, mild (HCC) F33.0 FLUoxetine (PROZAC) 20 MG capsule  3. GAD (generalized anxiety disorder) F41.1   4. Elevated BP without diagnosis of hypertension R03.0     Past Psychiatric History: Denies history of suicide attempts.  She denies any history of inpatient mental health admissions.  She used to follow up with Dr. Garnetta Buddy here in clinic in the past  Past Medical History:  Past Medical History:  Diagnosis Date  . ADHD (attention deficit hyperactivity disorder)   . Depression     Past Surgical History:  Procedure Laterality Date  . ANKLE FRACTURE SURGERY Right 04/08    Family Psychiatric History: Grandfather-depression, maternal aunt-ADHD, alcohol abuse, drug abuse, anxiety disorder, maternal uncle-alcohol abuse, maternal uncle-drug abuse.  Family History:  Family History  Problem Relation Age of Onset  . Hyperlipidemia Father   . Hypertension Mother   . ADD / ADHD Maternal Aunt   . Alcohol abuse Maternal Aunt   . Drug abuse Maternal Aunt   . Anxiety disorder Maternal Aunt   . Alcohol abuse Maternal Uncle   . Drug abuse Maternal Uncle   . Anxiety disorder Paternal Grandfather   . Depression Paternal Grandfather   . Dementia Paternal Grandmother   Substance abuse history: Denies   Social History: She lives in [place] with her husband and her 86-month-old baby boy - ' Madison Mack".  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  She has good relationship with her parents.  She has a bachelor's degree from [employer]. Social History   Socioeconomic History  . Marital status: Married    Spouse name: Madison Mack  . Number of children: 1  . Years of education: None  . Highest education level: Bachelor's degree (e.g., BA, AB, BS)  Social Needs  . Financial resource strain: Not hard at all  . Food insecurity - worry: Never true  . Food insecurity - inability: Never true  . Transportation needs - medical: No   . Transportation needs - non-medical: No  Occupational History  . Occupation: [employer]    Comment: full time  Tobacco Use  . Smoking status: Never Smoker  . Smokeless tobacco: Never Used  Substance and Sexual Activity  . Alcohol use: No    Alcohol/week: 0.0 oz  . Drug use: No  . Sexual activity: Yes  Other Topics Concern  . None  Social History Narrative  . None    Allergies:  Allergies  Allergen Reactions  . Penicillins Rash    Has patient had a PCN reaction causing immediate rash, facial/tongue/throat swelling, SOB or lightheadedness with hypotension: SWELLING FEET AND HANDS, HIVES Has patient had a PCN reaction causing severe rash involving mucus membranes or skin necrosis: NO Has patient had a PCN reaction that required hospitalization: NO Has patient had a PCN reaction occurring within the last 10 years: NO If all of the above answers are "NO", then may proceed with Cephalosporin use.     Metabolic Disorder Labs: No results found for: HGBA1C, MPG No results found for: PROLACTIN No results found for: CHOL, TRIG, HDL, CHOLHDL, VLDL, LDLCALC No results found for: TSH  Therapeutic Level Labs: No results found for: LITHIUM No results found for: VALPROATE No components found for:  CBMZ  Current Medications: Current Outpatient Medications  Medication Sig Dispense Refill  . FLUoxetine (PROZAC) 20 MG capsule Take 1 capsule (20 mg total) by mouth daily. 30 capsule 2  . methylphenidate (CONCERTA) 36 MG PO CR tablet Take 1 tablet (36 mg total) by mouth every morning. 30 tablet 0   No current facility-administered medications for this visit.      Musculoskeletal: Strength & Muscle Tone: within normal limits Gait & Station: normal Patient leans: N/A  Psychiatric Specialty Exam: Review of Systems  Psychiatric/Behavioral: The patient is nervous/anxious.   All other systems reviewed and are negative.   Blood pressure (!) 165/105, pulse 90, height 5\' 8"  (1.727  m), weight 260 lb (117.9 kg).Body mass index is 39.53 kg/m.  General Appearance: Casual  Eye Contact:  Fair  Speech:  Clear and Coherent  Volume:  Normal  Mood:  Anxious  Affect:  Congruent  Thought Process:  Goal Directed and Descriptions of Associations: Intact  Orientation:  Full (Time, Place, and Person)  Thought Content: Logical   Suicidal Thoughts:  No  Homicidal Thoughts:  No  Memory:  Immediate;   Fair Recent;   Fair Remote;   Fair  Judgement:  Fair  Insight:  Good  Psychomotor Activity:  Normal  Concentration:  Concentration: Fair and Attention Span: Fair  Recall:  [employer]  [employer] of Knowledge: Fair  Language: Fair  Akathisia:  No  Handed:  Right  AIMS (if indicated): NA  Assets:  Communication Skills Desire for Improvement Physical Health Social Support  ADL's:  Intact  Cognition: WNL  Sleep:  Fair   Screenings:   Assessment and Plan: [name] is a  22 year old Caucasian female who has a history of depression, anxiety, ADD, presented to the clinic today for a follow-up visit.  Brianah is currently doing well on her current medication regimen.  She has good social support system, denies substance abuse issues, denies suicidality or homicidality.  She is compliant on her current medications.  She remains a good candidate for outpatient treatment at this time.  Plan For mood symptoms Continue Prozac 20 mg p.o. daily  ADHD Continue Concerta 36 mg p.o. daily Provided 2 scripts with date specified Reviewed [place] controlled substance database.  Elevated blood pressure Her blood pressure was checked twice during this visit and it continued to be elevated.  Patient reports a history of having elevated blood pressure when she is anxious or in the doctor's clinic.  Discussed with her the relationship between stimulants and her cardiac health.  Discussed with her that if her blood pressure continues to be elevated then she may have to consider a non-stimulant medications.  However  will ask her to monitor her blood pressure closely at home and keep a log.  She will also follow-up with primary medical doctor and get an EKG done.  Follow-up in clinic in 8 weeks or sooner if needed.  More than 50 % of the time was spent for psychoeducation and supportive psychotherapy and care coordination.  This note was generated in part or whole with voice recognition software. Voice recognition is usually quite accurate but there are transcription errors that can and very often do occur. I apologize for any typographical errors that were not detected and corrected.        Jomarie Longs, MD 09/09/2017, 4:50 PM

## 2017-11-05 ENCOUNTER — Ambulatory Visit (INDEPENDENT_AMBULATORY_CARE_PROVIDER_SITE_OTHER): Payer: BC Managed Care – PPO | Admitting: Psychiatry

## 2017-11-05 ENCOUNTER — Encounter: Payer: Self-pay | Admitting: Psychiatry

## 2017-11-05 ENCOUNTER — Other Ambulatory Visit: Payer: Self-pay

## 2017-11-05 VITALS — BP 131/83 | HR 91 | Temp 97.9°F | Wt 259.6 lb

## 2017-11-05 DIAGNOSIS — F900 Attention-deficit hyperactivity disorder, predominantly inattentive type: Secondary | ICD-10-CM

## 2017-11-05 DIAGNOSIS — F330 Major depressive disorder, recurrent, mild: Secondary | ICD-10-CM | POA: Diagnosis not present

## 2017-11-05 DIAGNOSIS — F411 Generalized anxiety disorder: Secondary | ICD-10-CM

## 2017-11-05 MED ORDER — METHYLPHENIDATE HCL ER (OSM) 36 MG PO TBCR: 36.0000 mg | EXTENDED_RELEASE_TABLET | ORAL | 0 refills | Status: DC

## 2017-11-05 MED ORDER — FLUOXETINE HCL 20 MG PO CAPS: 20.0000 mg | ORAL_CAPSULE | Freq: Every day | ORAL | 1 refills | Status: DC

## 2017-11-05 NOTE — Progress Notes (Signed)
BH MD OP Progress Note  11/05/2017 10:05 PM Madison Mack  MRN:  [id number]  Chief Complaint: ' I am good.' Chief Complaint    Follow-up; Medication Refill     WUJ:WJXBJY is a 22 year old Caucasian female, married, lives in [place], has a history of ADHD, depression, anxiety, presented to the clinic today for a follow-up visit.  Madison Mack today reports Concerta as doing it working well.  She reports she does not have any side effects like restlessness, sleep issues or suppressed appetite.  She reports she is able to focus and concentrate.  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  She is looking forward to a [employer] trip that is going to happen tomorrow.  Reports all the third graders are going there for a field trip tomorrow.  She reports her son just turned 26-year-old.  She reports she had a good birthday party for him.  She reports things have settled down since they were able to move to a new place.  She reports she is not too stressed out like she used to before.  She continues to take Prozac and is doing well on the same.  She denies any substance abuse problems.  She denies any suicidality or perceptual disturbances.  Blood pressure today seems to be within normal limits.  Last few visits she had elevated blood pressure.  She reports that may also have been because she was overwhelmed and stressed out. Visit Diagnosis:    ICD-10-CM   1. Attention deficit hyperactivity disorder (ADHD), predominantly inattentive type F90.0 methylphenidate (CONCERTA) 36 MG PO CR tablet    DISCONTINUED: methylphenidate (CONCERTA) 36 MG PO CR tablet    DISCONTINUED: methylphenidate (CONCERTA) 36 MG PO CR tablet  2. MDD (major depressive disorder), recurrent episode, mild (HCC) F33.0 FLUoxetine (PROZAC) 20 MG capsule  3. GAD (generalized anxiety disorder) F41.1     Past  Psychiatric History: Denies history of suicide attempts.  She denies any history of inpatient mental health admissions.  She used to follow up with Dr. Garnetta Buddy here in clinic in the past.  Past Medical History:  Past Medical History:  Diagnosis Date  . ADHD (attention deficit hyperactivity disorder)   . Depression     Past Surgical History:  Procedure Laterality Date  . ANKLE FRACTURE SURGERY Right 04/08    Family Psychiatric History: Father-depression, maternal aunt-ADHD, alcohol abuse, drug abuse, anxiety disorder, maternal uncle-alcohol abuse, maternal uncle-drug abuse.  Family History:  Family History  Problem Relation Age of Onset  . Hyperlipidemia Father   . Hypertension Mother   . ADD / ADHD Maternal Aunt   . Alcohol abuse Maternal Aunt   . Drug abuse Maternal Aunt   . Anxiety disorder Maternal Aunt   . Alcohol abuse Maternal Uncle   . Drug abuse Maternal Uncle   . Anxiety disorder Paternal Grandfather   . Depression Paternal Grandfather   . Dementia Paternal Grandmother    Substance abuse history: Denies  Social History: Lives in [place] with her husband and her 32-month-old baby boy 'Madison Mack.".  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  She has good relationship with her parents.  She has a bachelor's degree from [employer]. Social History   Socioeconomic History  . Marital status: Married    Spouse name: thomas  . Number of children: 1  . Years of education:  Not on file  . Highest education level: Bachelor's degree (e.g., BA, AB, BS)  Occupational History  . Occupation: [employer]    Comment: full time  Social Needs  . Financial resource strain: Not hard at all  . Food insecurity:    Worry: Never true    Inability: Never true  . Transportation needs:    Medical: No    Non-medical: No  Tobacco Use  . Smoking status: Never Smoker  . Smokeless tobacco: Never Used  Substance and Sexual Activity  . Alcohol use: No    Alcohol/week: 0.0 oz  . Drug use:  No  . Sexual activity: Yes  Lifestyle  . Physical activity:    Days per week: 0 days    Minutes per session: 0 min  . Stress: To some extent  Relationships  . Social connections:    Talks on phone: More than three times a week    Gets together: Never    Attends religious service: Never    Active member of club or organization: No    Attends meetings of clubs or organizations: Never    Relationship status: Married  Other Topics Concern  . Not on file  Social History Narrative  . Not on file    Allergies:  Allergies  Allergen Reactions  . Penicillins Rash    Has patient had a PCN reaction causing immediate rash, facial/tongue/throat swelling, SOB or lightheadedness with hypotension: SWELLING FEET AND HANDS, HIVES Has patient had a PCN reaction causing severe rash involving mucus membranes or skin necrosis: NO Has patient had a PCN reaction that required hospitalization: NO Has patient had a PCN reaction occurring within the last 10 years: NO If all of the above answers are "NO", then may proceed with Cephalosporin use.   Marland Kitchen. Doxycycline Hives    Metabolic Disorder Labs: No results found for: HGBA1C, MPG No results found for: PROLACTIN No results found for: CHOL, TRIG, HDL, CHOLHDL, VLDL, LDLCALC No results found for: TSH  Therapeutic Level Labs: No results found for: LITHIUM No results found for: VALPROATE No components found for:  CBMZ  Current Medications: Current Outpatient Medications  Medication Sig Dispense Refill  . FLUoxetine (PROZAC) 20 MG capsule Take 1 capsule (20 mg total) by mouth daily. 90 capsule 1  . methylphenidate (CONCERTA) 36 MG PO CR tablet Take 1 tablet (36 mg total) by mouth every morning. 30 tablet 0   No current facility-administered medications for this visit.      Musculoskeletal: Strength & Muscle Tone: within normal limits Gait & Station: normal Patient leans: N/A  Psychiatric Specialty Exam: Review of Systems   Psychiatric/Behavioral: Negative for depression. The patient is not nervous/anxious.   All other systems reviewed and are negative.   Blood pressure 131/83, pulse 91, temperature 97.9 F (36.6 C), temperature source Oral, weight 259 lb 9.6 oz (117.8 kg).Body mass index is 39.47 kg/m.  General Appearance: Casual  Eye Contact:  Fair  Speech:  Clear and Coherent  Volume:  Normal  Mood:  Anxious and Dysphoric  Affect:  Appropriate  Thought Process:  Goal Directed and Descriptions of Associations: Intact  Orientation:  Full (Time, Place, and Person)  Thought Content: Logical   Suicidal Thoughts:  No  Homicidal Thoughts:  No  Memory:  Immediate;   Fair Recent;   Fair Remote;   Fair  Judgement:  Fair  Insight:  Fair  Psychomotor Activity:  Normal  Concentration:  Concentration: Fair and Attention Span: Fair  Recall:  Fair  [employer] of Knowledge: Fair  Language: Fair  Akathisia:  No  Handed:  Right  AIMS (if indicated): NA  Assets:  Communication Skills Desire for Improvement Physical Health Social Support Talents/Skills Transportation  ADL's:  Intact  Cognition: WNL  Sleep:  Fair   Screenings:   Assessment and Plan: Madison Mack is a 22 year old Caucasian female who has a history of depression, anxiety, ADD, presented to the clinic today for a follow-up visit.  Patient today reports her medications as helpful.  She denies any side effects.  She continues to be a good candidate for outpatient treatment at this time.  Plan Depression Continue Prozac 20 mg p.o. Daily  For Anxiety sx: Continue Prozac as prescribed.  ADHD Continue Concerta 36 mg p.o. daily Provided 3 scripts with date specified. Reviewed [place] controlled substance database  For elevated blood pressure( last two visits) Her blood pressure seems to be within normal limits today.  Follow up in clinic in 3 months or sooner if needed.  More than 50 % of the time was spent for psychoeducation and supportive  psychotherapy and care coordination.  This note was generated in part or whole with voice recognition software. Voice recognition is usually quite accurate but there are transcription errors that can and very often do occur. I apologize for any typographical errors that were not detected and corrected.       Jomarie Longs, MD 11/05/2017, 10:05 PM

## 2018-01-28 ENCOUNTER — Ambulatory Visit: Payer: BC Managed Care – PPO | Admitting: Psychiatry

## 2018-01-28 ENCOUNTER — Encounter: Payer: Self-pay | Admitting: Psychiatry

## 2018-01-28 VITALS — BP 115/81 | HR 94 | Ht 68.0 in | Wt 269.0 lb

## 2018-01-28 DIAGNOSIS — F330 Major depressive disorder, recurrent, mild: Secondary | ICD-10-CM

## 2018-01-28 DIAGNOSIS — F900 Attention-deficit hyperactivity disorder, predominantly inattentive type: Secondary | ICD-10-CM

## 2018-01-28 MED ORDER — METHYLPHENIDATE HCL ER (OSM) 36 MG PO TBCR: 36.0000 mg | EXTENDED_RELEASE_TABLET | Freq: Every day | ORAL | 0 refills | Status: DC

## 2018-01-28 MED ORDER — METHYLPHENIDATE HCL ER 36 MG PO TB24: 36.0000 mg | ORAL_TABLET | Freq: Every day | ORAL | 0 refills | Status: DC

## 2018-01-28 MED ORDER — METHYLPHENIDATE HCL ER (OSM) 36 MG PO TBCR: 36.0000 mg | EXTENDED_RELEASE_TABLET | ORAL | 0 refills | Status: DC

## 2018-01-28 MED ORDER — FLUOXETINE HCL 20 MG PO CAPS: 20.0000 mg | ORAL_CAPSULE | Freq: Every day | ORAL | 2 refills | Status: DC

## 2018-01-28 NOTE — Progress Notes (Signed)
BH MD OP Progress Note  01/28/2018 3:57 PM Madison Mack  MRN:  [id number]  Chief Complaint: ' I am here for follow up.' Chief Complaint    Follow-up     HPI: Madison Mack is a 22 year old Caucasian female, married, lives in [place], has a history of ADHD, depression, anxiety, presented to the clinic today for a follow-up visit.  Patient today reports she is currently doing well on the Prozac.  She denies any mood symptoms.  Patient reports sleep is okay.  Patient reports she had to put down her dog of 10 years.  She reports she grew up with that dog.  She reports it was difficult.  She however reports it is good to know that her dog is not suffering anymore.  She reports she is coping with it day by day.  Patient reports Concerta as working well.  She denies any side effects.  She reports she just got her last prescription refilled 2 days ago.  Patient denies any suicidality.  Patient denies any perceptual disturbances.  Patient denies abusing any drugs or alcohol. Visit Diagnosis:    ICD-10-CM   1. Attention deficit hyperactivity disorder (ADHD), predominantly inattentive type F90.0 methylphenidate (CONCERTA) 36 MG PO CR tablet  2. MDD (major depressive disorder), recurrent episode, mild (HCC) F33.0 FLUoxetine (PROZAC) 20 MG capsule    Past Psychiatric History: Denies history of suicide attempts.  She denies any history of inpatient mental health admissions.  I have reviewed past psychiatric history from my progress note on 11/05/2017.  Past Medical History:  Past Medical History:  Diagnosis Date  . ADHD (attention deficit hyperactivity disorder)   . Depression     Past Surgical History:  Procedure Laterality Date  . ANKLE FRACTURE SURGERY Right 04/08    Family Psychiatric History: Reviewed family psychiatric history from my progress note on 11/05/2017 .  Family History:  Family History  Problem Relation Age of Onset  . Hyperlipidemia Father   . Hypertension Mother   .  ADD / ADHD Maternal Aunt   . Alcohol abuse Maternal Aunt   . Drug abuse Maternal Aunt   . Anxiety disorder Maternal Aunt   . Alcohol abuse Maternal Uncle   . Drug abuse Maternal Uncle   . Anxiety disorder Paternal Grandfather   . Depression Paternal Grandfather   . Dementia Paternal Grandmother    Substance abuse history: Denies  Social History: She lives in [place] with her husband and her 11-year-old baby boy 'Madison Mack". 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  I have reviewed social history from my progress note on 11/05/2017 Social History   Socioeconomic History  . Marital status: Married    Spouse name: thomas  . Number of children: 1  . Years of education: Not on file  . Highest education level: Bachelor's degree (e.g., BA, AB, BS)  Occupational History  . Occupation: [employer]    Comment: full time  Social Needs  . Financial resource strain: Not hard at all  . Food insecurity:    Worry: Never true    Inability: Never true  . Transportation needs:    Medical: No    Non-medical: No  Tobacco Use  . Smoking status: Never Smoker  . Smokeless tobacco: Never Used  Substance and Sexual Activity  . Alcohol use: No    Alcohol/week: 0.0 oz  . Drug use: No  . Sexual activity: Yes  Lifestyle  . Physical activity:    Days per week: 0  days    Minutes per session: 0 min  . Stress: To some extent  Relationships  . Social connections:    Talks on phone: More than three times a week    Gets together: Never    Attends religious service: Never    Active member of club or organization: No    Attends meetings of clubs or organizations: Never    Relationship status: Married  Other Topics Concern  . Not on file  Social History Narrative  . Not on file    Allergies:  Allergies  Allergen Reactions  . Penicillins Rash    Has patient had a PCN reaction causing immediate rash, facial/tongue/throat swelling, SOB or lightheadedness with hypotension: SWELLING FEET AND HANDS,  HIVES Has patient had a PCN reaction causing severe rash involving mucus membranes or skin necrosis: NO Has patient had a PCN reaction that required hospitalization: NO Has patient had a PCN reaction occurring within the last 10 years: NO If all of the above answers are "NO", then may proceed with Cephalosporin use.   Marland Kitchen. Doxycycline Hives    Metabolic Disorder Labs: No results found for: HGBA1C, MPG No results found for: PROLACTIN No results found for: CHOL, TRIG, HDL, CHOLHDL, VLDL, LDLCALC No results found for: TSH  Therapeutic Level Labs: No results found for: LITHIUM No results found for: VALPROATE No components found for:  CBMZ  Current Medications: Current Outpatient Medications  Medication Sig Dispense Refill  . FLUoxetine (PROZAC) 20 MG capsule Take 1 capsule (20 mg total) by mouth daily. 30 capsule 2  . [START ON 02/25/2018] methylphenidate (CONCERTA) 36 MG PO CR tablet Take 1 tablet (36 mg total) by mouth every morning. 30 tablet 0  . [START ON 04/24/2018] methylphenidate (CONCERTA) 36 MG PO CR tablet Take 1 tablet (36 mg total) by mouth daily. 30 tablet 0  . [START ON 03/26/2018] methylphenidate 36 MG PO CR tablet Take 1 tablet (36 mg total) by mouth daily. 30 tablet 0   No current facility-administered medications for this visit.      Musculoskeletal: Strength & Muscle Tone: within normal limits Gait & Station: normal Patient leans: N/A  Psychiatric Specialty Exam: Review of Systems  Psychiatric/Behavioral: The patient is nervous/anxious (improved).   All other systems reviewed and are negative.   Blood pressure 115/81, pulse 94, height 5\' 8"  (1.727 m), weight 269 lb (122 kg), SpO2 96 %.Body mass index is 40.9 kg/m.  General Appearance: Casual  Eye Contact:  Fair  Speech:  Clear and Coherent  Volume:  Normal  Mood:  Euthymic  Affect:  Congruent  Thought Process:  Goal Directed and Descriptions of Associations: Intact  Orientation:  Full (Time, Place, and  Person)  Thought Content: Logical   Suicidal Thoughts:  No  Homicidal Thoughts:  No  Memory:  Immediate;   Fair Recent;   Fair Remote;   Fair  Judgement:  Fair  Insight:  Fair  Psychomotor Activity:  Normal  Concentration:  Concentration: Fair and Attention Span: Fair  Recall:  [employer]Fair  Fund of Knowledge: Fair  Language: Fair  Akathisia:  No  Handed:  Right  AIMS (if indicated): na  Assets:  Communication Skills Desire for Improvement Social Support  ADL's:  Intact  Cognition: WNL  Sleep:  Fair   Screenings:   Assessment and Plan: [name] is a 22 year old Caucasian female who has a history of depression, anxiety, ADD, presented to the clinic today for a follow-up visit.  Patient continues to do well on  her current medication regimen.  Will continue plan as noted below.  Plan Depression Continue Prozac 20 mg p.o. daily.  ADHD Continue Concerta 36 mg p.o. daily. Provided 3 prescription with date specified.  The last one to be filled on or after 04/24/2018 Reviewed [place] controlled substance database.  Follow-up in clinic in 3 months.  More than 50 % of the time was spent for psychoeducation and supportive psychotherapy and care coordination.  This note was generated in part or whole with voice recognition software. Voice recognition is usually quite accurate but there are transcription errors that can and very often do occur. I apologize for any typographical errors that were not detected and corrected.       Jomarie Longs, MD 01/29/2018, 1:27 PM

## 2018-01-29 ENCOUNTER — Encounter: Payer: Self-pay | Admitting: Psychiatry

## 2018-04-29 ENCOUNTER — Ambulatory Visit: Payer: BC Managed Care – PPO | Admitting: Psychiatry

## 2018-05-04 ENCOUNTER — Encounter: Payer: Self-pay | Admitting: Advanced Practice Midwife

## 2018-05-04 ENCOUNTER — Ambulatory Visit (INDEPENDENT_AMBULATORY_CARE_PROVIDER_SITE_OTHER): Payer: BC Managed Care – PPO | Admitting: Advanced Practice Midwife

## 2018-05-04 VITALS — BP 128/70 | HR 103 | Ht 68.0 in | Wt 270.0 lb

## 2018-05-04 DIAGNOSIS — N912 Amenorrhea, unspecified: Secondary | ICD-10-CM

## 2018-05-04 DIAGNOSIS — Z3202 Encounter for pregnancy test, result negative: Secondary | ICD-10-CM

## 2018-05-04 LAB — POCT URINE PREGNANCY: Preg Test, Ur: NEGATIVE

## 2018-05-04 NOTE — Progress Notes (Addendum)
Patient ID: Madison Mack, female   DOB: [birth year], 22 y.o.   MRN: [id number]  Reason for Visit: Amenorrhea (Has Mirena, normal monthly cycles, missed this month, one positive home test)     Subjective:     HPI:  Madison Cannershley Taylor Beets is a 22 y.o. female being seen today for positive home pregnancy test following a missed period. She has had Mirena IUD since May of 2018 and has had regular monthly periods since having the IUD. She missed a period this month and took a pregnancy test. Per her report the test had a faint positive result. She has noticed some symptoms of mood swings, bloating, nausea since her missed period and said it reminded her of when she was pregnant with her son. She admits to having had a PAP smear with result of abnormal cells prior to her first pregnancy and has not had a follow up PAP smear.    Past Medical History:  Diagnosis Date  . ADHD (attention deficit hyperactivity disorder)   . Depression    Family History  Problem Relation Age of Onset  . Hyperlipidemia Father   . Hypertension Mother   . ADD / ADHD Maternal Aunt   . Alcohol abuse Maternal Aunt   . Drug abuse Maternal Aunt   . Anxiety disorder Maternal Aunt   . Alcohol abuse Maternal Uncle   . Drug abuse Maternal Uncle   . Anxiety disorder Paternal Grandfather   . Depression Paternal Grandfather   . Dementia Paternal Grandmother    Past Surgical History:  Procedure Laterality Date  . ANKLE FRACTURE SURGERY Right 04/08    Short Social History:  Social History   Tobacco Use  . Smoking status: Never Smoker  . Smokeless tobacco: Never Used  Substance Use Topics  . Alcohol use: No    Alcohol/week: 0.0 standard drinks    Allergies  Allergen Reactions  . Penicillins Rash    Has patient had a PCN reaction causing immediate rash, facial/tongue/throat swelling, SOB or lightheadedness with hypotension: SWELLING FEET AND HANDS, HIVES Has patient had a PCN reaction causing severe rash  involving mucus membranes or skin necrosis: NO Has patient had a PCN reaction that required hospitalization: NO Has patient had a PCN reaction occurring within the last 10 years: NO If all of the above answers are "NO", then may proceed with Cephalosporin use.   Marland Kitchen. Doxycycline Hives    Current Outpatient Medications  Medication Sig Dispense Refill  . FLUoxetine (PROZAC) 20 MG capsule Take 1 capsule (20 mg total) by mouth daily. 30 capsule 2  . methylphenidate (CONCERTA) 36 MG PO CR tablet Take 1 tablet (36 mg total) by mouth every morning. 30 tablet 0   No current facility-administered medications for this visit.     Review of Systems  Constitutional:       Fatigue, change of appetite, hot flashes  HENT: HENT negative.  Eyes: Eyes negative.  Respiratory: Respiratory negative.  Cardiovascular: Cardiovascular negative.  GI:       Nausea, vomiting, diarrhea GU:       Frequent urination Musculoskeletal: Musculoskeletal negative.  Skin: Skin negative.  Neurological:       Dizziness  Hematologic: Hematologic/lymphatic negative.  Psychiatric: Psychiatric negative.   Breast: tenderness     Objective:  Objective   Vitals:   05/04/18 1342  BP: 128/70  Pulse: (!) 103  Weight: 270 lb (122.5 kg)  Height: 5\' 8"  (1.727 m)   Body mass index is 41.05 kg/m.  Vital Signs: BP 128/70 (BP Location: Left Arm, Patient Position: Sitting, Cuff Size: Large)   Pulse (!) 103   Ht 5\' 8"  (1.727 m)   Wt 270 lb (122.5 kg)   LMP 03/13/2018   BMI 41.05 kg/m  Constitutional: Well nourished, well developed female in no acute distress.  HEENT: normal Skin: Warm and dry.  Cardiovascular: Regular rate and rhythm.   Respiratory: Clear to auscultation bilateral. Normal respiratory effort Psych: Alert and Oriented x3. No memory deficits. Normal mood and affect.    Data: POCT: urine pregnancy test negative today     Assessment/Plan:  22 yo G1P1 female with amenorrhea, negative pregnancy  test  1. Beta Hcg level drawn today- will repeat as needed 2. Schedule annual exam with PAP smear screening    Tresea Mall CNM

## 2018-05-05 LAB — BETA HCG QUANT (REF LAB): hCG Quant: <1 m[IU]/mL

## 2018-09-08 ENCOUNTER — Other Ambulatory Visit (HOSPITAL_COMMUNITY)
Admission: RE | Admit: 2018-09-08 | Discharge: 2018-09-08 | Disposition: A | Discharge status: Home / Self Care | Payer: BC Managed Care – PPO | Source: Ambulatory Visit | Attending: Family Medicine | Admitting: Family Medicine

## 2018-09-08 ENCOUNTER — Encounter: Payer: Self-pay | Admitting: Family Medicine

## 2018-09-08 ENCOUNTER — Ambulatory Visit: Payer: Self-pay | Admitting: Family Medicine

## 2018-09-08 VITALS — BP 124/73 | HR 79 | Temp 98.5°F | Resp 16 | Ht 68.0 in | Wt 283.0 lb

## 2018-09-08 DIAGNOSIS — R1115 Cyclical vomiting syndrome unrelated to migraine: Secondary | ICD-10-CM

## 2018-09-08 DIAGNOSIS — R112 Nausea with vomiting, unspecified: Secondary | ICD-10-CM

## 2018-09-08 DIAGNOSIS — Z118 Encounter for screening for other infectious and parasitic diseases: Secondary | ICD-10-CM | POA: Insufficient documentation

## 2018-09-08 DIAGNOSIS — F988 Other specified behavioral and emotional disorders with onset usually occurring in childhood and adolescence: Secondary | ICD-10-CM | POA: Insufficient documentation

## 2018-09-08 DIAGNOSIS — F3341 Major depressive disorder, recurrent, in partial remission: Secondary | ICD-10-CM | POA: Insufficient documentation

## 2018-09-08 DIAGNOSIS — F330 Major depressive disorder, recurrent, mild: Secondary | ICD-10-CM | POA: Insufficient documentation

## 2018-09-08 LAB — POCT URINE PREGNANCY: PREG TEST UR: NEGATIVE

## 2018-09-08 MED ORDER — SUCRALFATE 1 G PO TABS: 1.0000 g | ORAL_TABLET | Freq: Three times a day (TID) | ORAL | 2 refills | Status: DC

## 2018-09-08 MED ORDER — ONDANSETRON 4 MG PO TBDP: 4.0000 mg | ORAL_TABLET | Freq: Three times a day (TID) | ORAL | 2 refills | Status: DC | PRN

## 2018-09-08 MED ORDER — OMEPRAZOLE 40 MG PO CPDR: 40.0000 mg | DELAYED_RELEASE_CAPSULE | Freq: Every day | ORAL | 3 refills | Status: DC

## 2018-09-08 NOTE — Assessment & Plan Note (Signed)
Currently stable On SSRI Followed by Psych [facility]

## 2018-09-08 NOTE — Patient Instructions (Addendum)
Thank you for coming to the office today.  [facility] [address] 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 Prefer to take this med about 30 min before breakfast or 1st meal of day for 4 weeks, don't stop taking unless we discuss first.  Take other prescribed medicine Carafate (Sucralfate) as needed up to 4 times daily (3 meals and bedtime)  to coat stomach lining to ease symptoms, if it helps reduce symptoms then it is more likely to be due to acid and/or ulcer.  Zofran as needed  DIET RECOMMENDATIONS - Avoid spicy, greasy, fried foods, also things like caffeine, dark chocolate, peppermint can worsen - Avoid large meals and late night snacks, also do not go more than 4-5 hours without a snack or meal (not eating will worsen reflux symptoms due to stomach acid) - You may also elevate the head of your bed at night to sleep at very slight incline to help reduce symptoms  If the problem improves but keeps coming back, we can discuss higher dose or longer course at next visit.  If symptoms are worsening or persistent despite treatment or develop any different severe esophagus or abdominal pain, unable to swallow solids or liquids, nausea, vomiting especially blood in vomit, fever/chills, or unintentional weight loss / no appetite, please follow-up sooner in office or seek more immediate medical attention at hospital Emergency Department.  Regarding other medicines:  - STOP taking Ibuprofen, Advil, Motrin, Goody's / BC powder - DO NOT take without discussing with your doctor. These medicines can put you at high risk for future bleeding.  If need pain medicine, may take Tylenol Extra Strength (Acetaminophen) 500mg  tabs - take 1 to 2 tabs per dose (max 1000mg ) every 6-8 hours for pain (take regularly, don't skip a dose for next 7 days), max  24 hour daily dose is 6 tablets or 3000mg . In the future you can repeat the same everyday Tylenol course for 1-2 weeks at a time.   Please schedule a Follow-up Appointment to: Return in about 3 months (around 12/08/2018) for Follow-up after GI, Nausea Vomiting.  If you have any other questions or concerns, please feel free to call the office or send a message through MyChart. You may also schedule an earlier appointment if necessary.  Additionally, you may be receiving a survey about your experience at our office within a few days to 1 week by e-mail or mail. We value your feedback.  Saralyn Pilar, DO [facility], [place]

## 2018-09-08 NOTE — Progress Notes (Signed)
Subjective:    Patient ID: Madison Mack, female    DOB: 1996-03-30, 23 y.o.   MRN: [id number]  Madison Mack is a 23 y.o. female presenting on 09/08/2018 for Establish Care (onset week every morning around 4:30 am throwing up LMP 03/2018 has IUD, nausea had same episode in 04/2018 )  Establish care with new PCP. Previously has not had PCP. Previously followed by mental health and then GYN.  HPI   Chronic Nausea, Vomiting Reports symptoms have a long history of similar symptoms GI, back in 2014 she had recurrent nausea vomiting and worse with abdominal pain with eating. She had GI work-up abdominal US and MRI she believes. Ultimately treated. Unsure which med. This took place in [place] while at [employer].  She describes chronic symptoms of nausea. In past she has typical history of episodes of worse nausea vomiting, previously would have 4-5 days of nausea vomiting symptoms and then few weeks symptom free with 100% improve no nausea or vomiting. - Recent course seems to be gradually worse - Last flare up onset over a week ago, with nausea vomiting every morning for about 6-7 days, and then yesterday she felt better and asymptomatic, then today she got nausea vomiting again. Describes vomiting as smaller amount, usually mucus and stomach acid, occasionally food from before, may have some specks of blood, some recent episode with "bright orange". She has some soreness in chest wall after vomiting and burning in throat after. - She usually keeps regular diet plan, this does seem to worsen her symptoms at times, can have limited intake in morning, may skip breakfast, may have small lunch. No major dietary changes, foods are similar for long time. - She has missed her work at times, and overall function Tried Nexium 20mg  OTC limited relief - Similar to prior nausea in pregnancy, she has an IUD, and not concerned about pregnancy but did home urine pregnancy test and checked  here as well - Also history of car sickness, and significant nausea vomiting while pregnant in past - Denies any dizziness vertigo spinning, abdominal pain, fevers chills, dark stool blood in stool, diarrhea, constipation    Major Depression, chronic recurrent / Generalized Anxiety / ADD Prior history symptom onset in High School and worse in college with ADD. - Currently on meds for >6 years, without significant change. Taking Fluoxetine 20mg  and Concerta 36 daily, [facility] Dr Elna BreslowEappen q 3 months usually    [facility] 09/08/2018  Decreased Interest 0  Down, Depressed, Hopeless 1  PHQ - 2 Score 1  Altered sleeping 0  Tired, decreased energy 1  Change in appetite 0  Feeling bad or failure about yourself  0  Trouble concentrating 1  Moving slowly or fidgety/restless 0  Suicidal thoughts 0  PHQ-9 Score 3  Difficult doing work/chores Not difficult at all    Past Medical History:  Diagnosis Date  . Allergy   . Anxiety   . HA (headache)    Past Surgical History:  Procedure Laterality Date  . ANKLE FRACTURE SURGERY Right 04/08   Social History   Socioeconomic History  . Marital status: Married    Spouse name: thomas  . Number of children: 1  . Years of education: Not on file  . Highest education level: Bachelor's degree (e.g., BA, AB, BS)  Occupational History  . 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 Physicist, medicalinancial resource  strain: Not hard at all  . Food insecurity:    Worry: Never true    Inability: Never true  . Transportation needs:    Medical: No    Non-medical: No  Tobacco Use  . Smoking status: Never Smoker  . Smokeless tobacco: Never Used  Substance and Sexual Activity  . Alcohol use: No    Alcohol/week: 0.0 standard drinks  . Drug use: No  . Sexual activity: Yes    Birth control/protection: None  Lifestyle  . Physical activity:    Days per week: 0 days    Minutes per  session: 0 min  . Stress: To some extent  Relationships  . Social connections:    Talks on phone: More than three times a week    Gets together: Never    Attends religious service: Never    Active member of club or organization: No    Attends meetings of clubs or organizations: Never    Relationship status: Married  . Intimate partner violence:    Fear of current or ex partner: No    Emotionally abused: No    Physically abused: No    Forced sexual activity: No  Other Topics Concern  . Not on file  Social History Narrative  . Not on file   Family History  Problem Relation Age of Onset  . Hyperlipidemia Father   . Hypertension Mother   . ADD / ADHD Maternal Aunt   . Alcohol abuse Maternal Aunt   . Drug abuse Maternal Aunt   . Anxiety disorder Maternal Aunt   . Alcohol abuse Maternal Uncle   . Drug abuse Maternal Uncle   . Anxiety disorder Paternal Grandfather   . Depression Paternal Grandfather   . Dementia Paternal Grandmother    Current Outpatient Medications on File Prior to Visit  Medication Sig  . FLUoxetine (PROZAC) 20 MG capsule Take 1 capsule (20 mg total) by mouth daily.  . methylphenidate (CONCERTA) 36 MG PO CR tablet Take 1 tablet (36 mg total) by mouth every morning.   No current facility-administered medications on file prior to visit.     Review of Systems Per HPI unless specifically indicated above      Objective:    BP 124/73   Pulse 79   Temp 98.5 F (36.9 C) (Oral)   Resp 16   Ht 5\' 8"  (1.727 m)   Wt 283 lb (128.4 kg)   BMI 43.03 kg/m   Wt Readings from Last 3 Encounters:  09/08/18 283 lb (128.4 kg)  05/04/18 270 lb (122.5 kg)  08/02/15 228 lb (103.4 kg)    Physical Exam Vitals signs and nursing note reviewed.  Constitutional:      General: She is not in acute distress.    Appearance: She is well-developed. She is not diaphoretic.     Comments: Well-appearing, comfortable, cooperative, obese  HENT:     Head: Normocephalic and  atraumatic.  Eyes:     General:        Right eye: No discharge.        Left eye: No discharge.     Conjunctiva/sclera: Conjunctivae normal.  Neck:     Musculoskeletal: Normal range of motion and neck supple.     Thyroid: No thyromegaly.  Cardiovascular:     Rate and Rhythm: Normal rate and regular rhythm.     Heart sounds: Normal heart sounds. No murmur.  Pulmonary:     Effort: Pulmonary effort is normal. No respiratory distress.  Breath sounds: Normal breath sounds. No wheezing or rales.  Abdominal:     General: Bowel sounds are normal. There is no distension.     Palpations: Abdomen is soft. There is no mass.     Tenderness: There is abdominal tenderness (mild scattered areas of tenderness some epigastric, LLQ). There is no guarding or rebound.     Hernia: No hernia is present.  Musculoskeletal: Normal range of motion.  Lymphadenopathy:     Cervical: No cervical adenopathy.  Skin:    General: Skin is warm and dry.     Findings: No erythema or rash.  Neurological:     Mental Status: She is alert and oriented to person, place, and time.  Psychiatric:        Behavior: Behavior normal.     Comments: Well groomed, good eye contact, normal speech and thoughts    Results for orders placed or performed in visit on 09/08/18  HM HIV SCREENING LAB  Result Value Ref Range   HM HIV Screening Negative - Validated   POCT urine pregnancy  Result Value Ref Range   Preg Test, Ur Negative Negative      Assessment & Plan:   Problem List Items Addressed This Visit    ADD (attention deficit disorder)   Cyclic vomiting syndrome - Primary    Worsening nausea vomiting cyclical episodes, complex history for years now seems worse, without other clear etiology based on history - Has IUD, negative Upregnancy test - No other GI red flags - Not exactly related to meals or diet - Not consistent with infection etiology - Not linked with medications based on timing, less likely side  effect  Plan Referral to [facility]  starting treatment for GERD/PUD and anti-emetic - with PPI omeprazole 40mg  daily, and Sucralfate, Zofran as symptomatic relief, and recommending 2nd opinion for cyclical vomiting diagnostic work-up and other specialized treatment      Relevant Medications   omeprazole (PRILOSEC) 40 MG capsule   sucralfate (CARAFATE) 1 g tablet   ondansetron (ZOFRAN-ODT) 4 MG disintegrating tablet   Other Relevant Orders   Ambulatory referral to Gastroenterology   MDD (major depressive disorder), recurrent episode, mild (HCC)    Currently stable On SSRI Followed by [facility]       Other Visit Diagnoses    Nausea and vomiting, intractability of vomiting not specified, unspecified vomiting type       Relevant Orders   POCT urine pregnancy (Completed)   Ambulatory referral to Gastroenterology   Screening for chlamydial disease    Routine screening test, based on risk factors and standard screening guidelines for age range.    Relevant Orders   GC/Chlamydia probe amp ([facility])not at [facility]      Meds ordered this encounter  Medications  . omeprazole (PRILOSEC) 40 MG capsule    Sig: Take 1 capsule (40 mg total) by mouth daily before breakfast.    Dispense:  30 capsule    Refill:  3  . sucralfate (CARAFATE) 1 g tablet    Sig: Take 1 tablet (1 g total) by mouth 4 (four) times daily -  with meals and at bedtime. Only as needed    Dispense:  30 tablet    Refill:  2  . ondansetron (ZOFRAN-ODT) 4 MG disintegrating tablet    Sig: Take 1-2 tablets (4-8 mg total) by mouth every 8 (eight) hours as needed for nausea or vomiting.    Dispense:  30 tablet    Refill:  2   Orders Placed This Encounter  Procedures  . HM HIV SCREENING LAB    This external order was created through the Results Console.  . Ambulatory referral to Gastroenterology    Referral Priority:   Routine    Referral Type:   Consultation    Referral Reason:   Specialty Services Required     Number of Visits Requested:   1  . POCT urine pregnancy    Follow up plan: Return in about 3 months (around 12/08/2018) for Follow-up after GI, Nausea Vomiting.  Saralyn Pilar, DO [facility] 09/08/2018, 3:25 PM

## 2018-09-08 NOTE — Assessment & Plan Note (Addendum)
Worsening nausea vomiting cyclical episodes, complex history for years now seems worse, without other clear etiology based on history - Has IUD, negative Upregnancy test - No other GI red flags - Not exactly related to meals or diet - Not consistent with infection etiology - Not linked with medications based on timing, less likely side effect  Plan Referral to [facility]  starting treatment for GERD/PUD and anti-emetic - with PPI omeprazole 40mg  daily, and Sucralfate, Zofran as symptomatic relief, and recommending 2nd opinion for cyclical vomiting diagnostic work-up and other specialized treatment

## 2018-09-13 LAB — GC/CHLAMYDIA PROBE AMP (~~LOC~~) NOT AT ARMC
CHLAMYDIA, DNA PROBE: NEGATIVE
Neisseria Gonorrhea: NEGATIVE

## 2018-09-20 ENCOUNTER — Encounter: Payer: Self-pay | Admitting: *Deleted

## 2018-09-21 ENCOUNTER — Ambulatory Visit (INDEPENDENT_AMBULATORY_CARE_PROVIDER_SITE_OTHER): Payer: BC Managed Care – PPO | Admitting: Advanced Practice Midwife

## 2018-09-21 ENCOUNTER — Encounter: Payer: Self-pay | Admitting: Advanced Practice Midwife

## 2018-09-21 VITALS — BP 128/88 | Ht 69.0 in

## 2018-09-21 DIAGNOSIS — Z30432 Encounter for removal of intrauterine contraceptive device: Secondary | ICD-10-CM | POA: Diagnosis not present

## 2018-09-21 NOTE — Progress Notes (Signed)
    GYNECOLOGY OFFICE PROCEDURE NOTE  Madison Mack is a 23 y.o. G1P1001 here for IUD removal. The patient currently has Mirena IUD placed 2 years ago.  No GYN concerns.  Last pap smear was 2 years ago and was normal. The patient plans conception in the near future.   IUD Removal  Patient identified, informed consent performed.  Time out was performed. Speculum placed in the vagina. The strings of the IUD were grasped and pulled using ring forceps. The IUD was successfully removed in its entirety.  Patient tolerated procedure well.   BP 128/88   Ht 5\' 9"  (1.753 m)   BMI 41.79 kg/m     Tresea Mall, CNM  [facility], [facility]  IUD insertion CPT [phone],  [facility] [id number] Mirena [id number] Liletta J7297 Paraguard J7300 Rutha Bouchard [id number] IUD remval 00349 Modifer 25, plus Modifer 79 is done during a global billing visit

## 2018-09-21 NOTE — Progress Notes (Deleted)
  History of Present Illness:  Madison Mack is a 23 y.o. that had a {IUD:23561} IUD placed approximately {NUMBERS:20191} {days/wks/mos/yrs:310907} ago. Since that time, she states that ***.  {Common ambulatory SmartLinks:19316}  Patient Active Problem List   Diagnosis Date Noted  . MDD (major depressive disorder), recurrent episode, mild (HCC) 09/08/2018  . Cyclic vomiting syndrome 09/08/2018  . ADD (attention deficit disorder) 09/08/2018   Medications:  Current Outpatient Medications on File Prior to Visit  Medication Sig Dispense Refill  . FLUoxetine (PROZAC) 20 MG capsule Take 1 capsule (20 mg total) by mouth daily. 30 capsule 2  . methylphenidate (CONCERTA) 36 MG PO CR tablet Take 1 tablet (36 mg total) by mouth every morning. 30 tablet 0  . omeprazole (PRILOSEC) 40 MG capsule Take 1 capsule (40 mg total) by mouth daily before breakfast. 30 capsule 3  . ondansetron (ZOFRAN-ODT) 4 MG disintegrating tablet Take 1-2 tablets (4-8 mg total) by mouth every 8 (eight) hours as needed for nausea or vomiting. 30 tablet 2  . sucralfate (CARAFATE) 1 g tablet Take 1 tablet (1 g total) by mouth 4 (four) times daily -  with meals and at bedtime. Only as needed 30 tablet 2   No current facility-administered medications on file prior to visit.    Allergies: is allergic to penicillins; amoxicillin; and doxycycline.  Physical Exam:  There were no vitals taken for this visit. There is no height or weight on file to calculate BMI. Constitutional: Well nourished, well developed female in no acute distress.  Abdomen: diffusely non tender to palpation, non distended, and no masses, hernias Neuro: Grossly intact Psych:  Normal mood and affect.    Pelvic exam:  Two IUD strings {DESC; PRESENT/ABSENT:17923::"present"} seen coming from the cervical os. EGBUS, vaginal vault and cervix: within normal limits  IUD Removal Strings of IUD identified and grasped.  IUD removed without problem.  Pt tolerated  this well.  IUD noted to be intact.  Assessment: IUD Removal  Plan: IUD removed and plan for contraception is {PLAN CONTRACEPTION:313102}. She was amenable to this plan.  Annamarie Major, M.D. 09/21/2018 12:10 PM

## 2018-09-28 ENCOUNTER — Encounter: Payer: Self-pay | Admitting: Family Medicine

## 2018-09-28 ENCOUNTER — Ambulatory Visit: Payer: BC Managed Care – PPO | Admitting: Family Medicine

## 2018-09-28 ENCOUNTER — Other Ambulatory Visit: Payer: Self-pay

## 2018-09-28 VITALS — BP 111/64 | HR 81 | Temp 98.6°F | Resp 16 | Ht 69.0 in | Wt 282.6 lb

## 2018-09-28 DIAGNOSIS — J0100 Acute maxillary sinusitis, unspecified: Secondary | ICD-10-CM | POA: Diagnosis not present

## 2018-09-28 DIAGNOSIS — J029 Acute pharyngitis, unspecified: Secondary | ICD-10-CM | POA: Diagnosis not present

## 2018-09-28 LAB — POCT RAPID STREP A (OFFICE): Rapid Strep A Screen: NEGATIVE

## 2018-09-28 MED ORDER — AZITHROMYCIN 250 MG PO TABS: ORAL_TABLET | ORAL | 0 refills | Status: DC

## 2018-09-28 MED ORDER — IPRATROPIUM BROMIDE 0.06 % NA SOLN: 2.0000 | Freq: Four times a day (QID) | NASAL | 0 refills | Status: DC

## 2018-09-28 MED ORDER — BENZONATATE 100 MG PO CAPS: 100.0000 mg | ORAL_CAPSULE | Freq: Three times a day (TID) | ORAL | 0 refills | Status: DC | PRN

## 2018-09-28 NOTE — Progress Notes (Signed)
Subjective:    Patient ID: Madison Mack, female    DOB: 1996-07-09, 23 y.o.   MRN: [id number]  Druann Stolzman is a 23 y.o. female presenting on 09/28/2018 for Cough (onset week sinus pressure more on right side arouhd eye but sore throat from past 2 days which is getting worst from yesterday and cough and greenish mucus, chest hurts while coughing denies chills or fever)  Patient presents for a same day appointment.  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 No known sick contact with flu or strep. But there have been several cases of flu recently. - Admits occasional productive cough - Denies any fevers, chills, body aches, nausea vomiting abdominal pain  Health Maintenance: Did not get Flu vaccine this season.  Depression screen [facility] 2/9 09/28/2018 09/08/2018  Decreased Interest 0 0  Down, Depressed, Hopeless 0 1  PHQ - 2 Score 0 1  Altered sleeping 0 0  Tired, decreased energy 0 1  Change in appetite 0 0  Feeling bad or failure about yourself  0 0  Trouble concentrating 0 1  Moving slowly or fidgety/restless 0 0  Suicidal thoughts 0 0  PHQ-9 Score 0 3  Difficult doing work/chores Not difficult at all Not difficult at all    Social History   Tobacco Use  . Smoking status: Never Smoker  . Smokeless tobacco: Never Used  Substance Use Topics  . Alcohol use: No    Alcohol/week: 0.0 standard drinks  . Drug use: No    Review of Systems Per HPI unless specifically indicated above     Objective:    BP 111/64   Pulse 81   Temp 98.6 F (37 C) (Oral)   Resp 16   Ht 5\' 9"   (1.753 m)   Wt 282 lb 9.6 oz (128.2 kg)   SpO2 98%   BMI 41.73 kg/m   Wt Readings from Last 3 Encounters:  09/28/18 282 lb 9.6 oz (128.2 kg)  09/08/18 283 lb (128.4 kg)  05/04/18 270 lb (122.5 kg)    Physical Exam Vitals signs and nursing note reviewed.  Constitutional:      General: She is not in acute distress.    Appearance: She is well-developed. She is not diaphoretic.     Comments: Mildly tired and ill appearing, comfortable, cooperative  HENT:     Head: Normocephalic and atraumatic.     Comments: R>L maxillary sinuses tender. Nares turbinate edema and congestion without purulence. Bilateral TMs clear without erythema, L is more full with mild effusion > R. No bulging. Oropharynx with significant thick post nasal drainage and erythema, without exudates, edema or asymmetry. Eyes:     General:        Right eye: No discharge.        Left eye: No discharge.     Conjunctiva/sclera: Conjunctivae normal.  Neck:     Musculoskeletal: Normal range of motion and neck supple.     Thyroid: No thyromegaly.  Cardiovascular:     Rate and Rhythm: Normal rate and regular rhythm.     Heart sounds: Normal heart sounds. No  murmur.  Pulmonary:     Effort: Pulmonary effort is normal. No respiratory distress.     Breath sounds: Normal breath sounds. No wheezing or rales.     Comments: Good air movement. Musculoskeletal: Normal range of motion.  Lymphadenopathy:     Cervical: No cervical adenopathy.  Skin:    General: Skin is warm and dry.     Findings: No erythema or rash.  Neurological:     Mental Status: She is alert and oriented to person, place, and time.  Psychiatric:        Behavior: Behavior normal.     Comments: Well groomed, good eye contact, normal speech and thoughts    Results for orders placed or performed in visit on 09/28/18  POCT rapid strep A  Result Value Ref Range   Rapid Strep A Screen Negative Negative      Assessment & Plan:   Problem List Items Addressed  This Visit    None    Visit Diagnoses    Acute non-recurrent maxillary sinusitis    -  Primary   Relevant Medications   azithromycin (ZITHROMAX Z-PAK) 250 MG tablet   ipratropium (ATROVENT) 0.06 % nasal spray   benzonatate (TESSALON) 100 MG capsule   Sore throat       Relevant Orders   POCT rapid strep A (Completed)      Consistent with acute maxillary R>L sinusitis, likely initially viral URI etiology now some worsening possible secondary bacterial infection, duration < 7 days as of now. - Not consistent with influenza. Rapid strep negative  Plan: 1. Reassurance, likely self-limited - no indication for antibiotics at this time - Agree to offer back up antibiotic printed Z-pak 5 day course only if not improve within 48 hour or if worsening signs of sinusitis 2. Start Atrovent nasal spray decongestant 2 sprays in each nostril up to 4 times daily for 7 days 3. Start Tessalon Perls take 1 capsule up to 3 times a day as needed for cough 4. Continue mucinex, sudafed, may try anti histamine, nasal saline  Return criteria reviewed   Meds ordered this encounter  Medications  . azithromycin (ZITHROMAX Z-PAK) 250 MG tablet    Sig: Take 2 tabs (500mg  total) on Day 1. Take 1 tab (250mg ) daily for next 4 days.    Dispense:  6 tablet    Refill:  0  . ipratropium (ATROVENT) 0.06 % nasal spray    Sig: Place 2 sprays into both nostrils 4 (four) times daily. For up to 5-7 days then stop.    Dispense:  15 mL    Refill:  0  . benzonatate (TESSALON) 100 MG capsule    Sig: Take 1 capsule (100 mg total) by mouth 3 (three) times daily as needed for cough.    Dispense:  30 capsule    Refill:  0    Follow up plan: Return in about 1 week (around 10/05/2018), or if symptoms worsen or fail to improve, for sinusitis.   Saralyn Pilar, DO [facility] 09/28/2018, 12:00 PM

## 2018-09-28 NOTE — Patient Instructions (Addendum)
Thank you for coming to the office today.  1. It sounds like you have a Sinusitis (Bacterial Infection) - this most likely started as an Upper Respiratory Virus that has settled into an infection. Allergies can also cause this.  Start Atrovent nasal spray decongestant 2 sprays in each nostril up to 4 times daily for 7 days  Start Tessalon Perls take 1 capsule up to 3 times a day as needed for cough  If not improved within 48 hours or worse with sinus pain and fever pressure or worse cough Then can pick up and start Azithromycin Z pak (antibiotic) 2 tabs day 1, then 1 tab x 4 days, complete entire course even if improved  May continue over the counter cold medicine as you are, I would not use any decongestant or mucinex longer than 7 days.  Optional - May try Loratadine (Claritin) 10mg  daily and Flonase 2 sprays in each nostril daily for next 4-6 weeks, then you may stop and use seasonally or as needed - Recommend to consider using Nasal Saline spray multiple times a day to help flush out congestion and clear sinuses - Improve hydration by drinking plenty of clear fluids (water, gatorade) to reduce secretions and thin congestion - Congestion draining down throat can cause irritation. May try warm herbal tea with honey, cough drops - Can take Tylenol or Ibuprofen as needed for fevers  If you develop persistent fever >101F for at least 3 consecutive days, headaches with sinus pain or pressure or persistent earache, please schedule a follow-up evaluation within next few days to week.   Please schedule a Follow-up Appointment to: Return in about 1 week (around 10/05/2018), or if symptoms worsen or fail to improve, for sinusitis.  If you have any other questions or concerns, please feel free to call the office or send a message through MyChart. You may also schedule an earlier appointment if necessary.  Additionally, you may be receiving a survey about your experience at our office within a few  days to 1 week by e-mail or mail. We value your feedback.  Saralyn Pilar, DO [facility], [place]

## 2018-11-29 ENCOUNTER — Telehealth: Payer: Self-pay

## 2018-11-29 NOTE — Telephone Encounter (Signed)
Patient is schedule 12/02/18 with J[facility]

## 2018-11-29 NOTE — Telephone Encounter (Signed)
Pt had IUD removed in Feb.  No cycle since.  Wants to be sure everything is okay c her for a healthy pregnancy.  Is there anything to take to bring cycle on?  [phone]  Pt has taken ovulations tests and pregnancy tests.  Ovulation tests are positive and pregnancy tests are negative.  Adv to be seen.  Msg sent to front desk for scheduling.

## 2018-12-02 ENCOUNTER — Other Ambulatory Visit: Payer: Self-pay

## 2018-12-02 ENCOUNTER — Encounter: Payer: Self-pay | Admitting: Advanced Practice Midwife

## 2018-12-02 ENCOUNTER — Ambulatory Visit (INDEPENDENT_AMBULATORY_CARE_PROVIDER_SITE_OTHER): Payer: BC Managed Care – PPO | Admitting: Advanced Practice Midwife

## 2018-12-02 VITALS — BP 122/74 | Ht 67.0 in | Wt 275.0 lb

## 2018-12-02 DIAGNOSIS — N911 Secondary amenorrhea: Secondary | ICD-10-CM | POA: Diagnosis not present

## 2018-12-02 MED ORDER — MEDROXYPROGESTERONE ACETATE 10 MG PO TABS: 10.0000 mg | ORAL_TABLET | Freq: Every day | ORAL | 0 refills | Status: DC

## 2018-12-02 NOTE — Patient Instructions (Signed)
Stress Stress is a normal reaction to life events. Stress is what you feel when life demands more than you are used to, or more than you think you can handle. Some stress can be useful, such as studying for a test or meeting a deadline at work. Stress that occurs too often or for too long can cause problems. It can affect your emotional health and interfere with relationships and normal daily activities. Too much stress can weaken your body's defense system (immune system) and increase your risk for physical illness. If you already have a medical problem, stress can make it worse. What are the causes? All sorts of life events can cause stress. An event that causes stress for one person may not be stressful for another person. Major life events, whether positive or negative, commonly cause stress. Examples include:  Losing a job or starting a new job.  Losing a loved one.  Moving to a new town or home.  Getting married or divorced.  Having a baby.  Injury or illness. Less obvious life events can also cause stress, especially if they occur day after day or in combination with each other. Examples include:  Working long hours.  Driving in traffic.  Caring for children.  Being in debt.  Being in a difficult relationship. What are the signs or symptoms? Stress can cause emotional symptoms, including:  Anxiety. This is feeling worried, afraid, on edge, overwhelmed, or out of control.  Anger, including irritation or impatience.  Depression. This is feeling sad, down, helpless, or guilty.  Trouble focusing, remembering, or making decisions. Stress can cause physical symptoms, including:  Aches and pains. These may affect your head, neck, back, stomach, or other areas of your body.  Tight muscles or a clenched jaw.  Low energy.  Trouble sleeping. Stress can cause unhealthy behaviors, including:  Eating to feel better (overeating) or skipping meals.  Working too much or  putting off tasks.  Smoking, drinking alcohol, or using drugs to feel better. How is this diagnosed? Stress is diagnosed through an assessment by your health care provider. He or she may diagnose this condition based on:  Your symptoms and any stressful life events.  Your medical history.  Tests to rule out other causes of your symptoms. Depending on your condition, your health care provider may refer you to a specialist for further evaluation. How is this treated?  Stress management techniques are the recommended treatment for stress. Medicine is not typically recommended for the treatment of stress. Techniques to reduce your reaction to stressful life events include:  Stress identification. Monitor yourself for symptoms of stress and identify what causes stress for you. These skills may help you to avoid or prepare for stressful events.  Time management. Set your priorities, keep a calendar of events, and learn to say "no." Taking these actions can help you avoid making too many commitments. Techniques for coping with stress include:  Rethinking the problem. Try to think realistically about stressful events rather than ignoring them or overreacting. Try to find the positives in a stressful situation rather than focusing on the negatives.  Exercise. Physical exercise can release both physical and emotional tension. The key is to find a form of exercise that you enjoy and do it regularly.  Relaxation techniques. These relax the body and mind. The key is to find one or more that you enjoy and use the technique(s) regularly. Examples include: ? Meditation, deep breathing, or progressive relaxation techniques. ? Yoga or tai chi. ?  Biofeedback, mindfulness techniques, or journaling. ? Listening to music, being out in nature, or participating in other hobbies.  Practicing a healthy lifestyle. Eat a balanced diet, drink plenty of water, limit or avoid caffeine, and get plenty of sleep.   Having a strong support network. Spend time with family, friends, or other people you enjoy being around. Express your feelings and talk things over with someone you trust. Counseling or talk therapy with a mental health professional may be helpful if you are having trouble managing stress on your own. Follow these instructions at home: Lifestyle   Avoid drugs.  Do not use any products that contain nicotine or tobacco, such as cigarettes and e-cigarettes. If you need help quitting, ask your health care provider.  Limit alcohol intake to no more than 1 drink a day for nonpregnant women and 2 drinks a day for men. One drink equals 12 oz of beer, 5 oz of wine, or 1 oz of hard liquor.  Do not use alcohol or drugs to relax.  Eat a balanced diet that includes fresh fruits and vegetables, whole grains, lean meats, fish, eggs, and beans, and low-fat dairy. Avoid processed foods and foods high in added fat, sugar, and salt.  Exercise at least 30 minutes on 5 or more days each week.  Get 7-8 hours of sleep each night. General instructions   Practice stress management techniques as discussed with your health care provider.  Drink enough fluid to keep your urine clear or pale yellow.  Take over-the-counter and prescription medicines only as told by your health care provider.  Keep all follow-up visits as told by your health care provider. This is important. Contact a health care provider if:  Your symptoms get worse.  You have new symptoms.  You feel overwhelmed by your problems and can no longer manage them on your own. Get help right away if:  You have thoughts of hurting yourself or others. If you ever feel like you may hurt yourself or others, or have thoughts about taking your own life, get help right away. You can go to your nearest emergency department or call:  Your local emergency services (911 in the [place]  A suicide crisis helpline, such as the [place] at 573-340-1584. This is open 24 hours a day. Summary  Stress is a normal reaction to life events. It can cause problems if it happens too often or for too long.  Practicing stress management techniques is the best way to treat stress.  Counseling or talk therapy with a mental health professional may be helpful if you are having trouble managing stress on your own. This information is not intended to replace advice given to you by your health care provider. Make sure you discuss any questions you have with your health care provider. Document Released: 01/21/2001 Document Revised: 09/17/2016 Document Reviewed: 09/17/2016 Elsevier Interactive Patient Education  2019 [place] refers to food and lifestyle choices that are based on the traditions of countries located on the [employer]. This way of eating has been shown to help prevent certain conditions and improve outcomes for people who have chronic diseases, like kidney disease and heart disease. What are tips for following this plan? Lifestyle  Cook and eat meals together with your family, when possible.  Drink enough fluid to keep your urine clear or pale yellow.  Be physically active every day. This includes: ? Aerobic exercise like running or swimming. ? Leisure  activities like gardening, walking, or housework.  Get 7-8 hours of sleep each night.  If recommended by your health care provider, drink red wine in moderation. This means 1 glass a day for nonpregnant women and 2 glasses a day for men. A glass of wine equals 5 oz (150 mL). Reading food labels   Check the serving size of packaged foods. For foods such as rice and pasta, the serving size refers to the amount of cooked product, not dry.  Check the total fat in packaged foods. Avoid foods that have saturated fat or trans fats.  Check the ingredients list for added sugars, such as corn syrup. Shopping  At the  grocery store, buy most of your food from the areas near the walls of the store. This includes: ? Fresh fruits and vegetables (produce). ? Grains, beans, nuts, and seeds. Some of these may be available in unpackaged forms or large amounts (in bulk). ? Fresh seafood. ? Poultry and eggs. ? Low-fat dairy products.  Buy whole ingredients instead of prepackaged foods.  Buy fresh fruits and vegetables in-season from local farmers markets.  Buy frozen fruits and vegetables in resealable bags.  If you do not have access to quality fresh seafood, buy precooked frozen shrimp or canned fish, such as tuna, salmon, or sardines.  Buy small amounts of raw or cooked vegetables, salads, or olives from the deli or salad bar at your store.  Stock your pantry so you always have certain foods on hand, such as olive oil, canned tuna, canned tomatoes, rice, pasta, and beans. Cooking  Cook foods with extra-virgin olive oil instead of using butter or other vegetable oils.  Have meat as a side dish, and have vegetables or grains as your main dish. This means having meat in small portions or adding small amounts of meat to foods like pasta or stew.  Use beans or vegetables instead of meat in common dishes like chili or lasagna.  Experiment with different cooking methods. Try roasting or broiling vegetables instead of steaming or sauteing them.  Add frozen vegetables to soups, stews, pasta, or rice.  Add nuts or seeds for added healthy fat at each meal. You can add these to yogurt, salads, or vegetable dishes.  Marinate fish or vegetables using olive oil, lemon juice, garlic, and fresh herbs. Meal planning   Plan to eat 1 vegetarian meal one day each week. Try to work up to 2 vegetarian meals, if possible.  Eat seafood 2 or more times a week.  Have healthy snacks readily available, such as: ? Vegetable sticks with hummus. ? [place] yogurt. ? Fruit and nut trail mix.  Eat balanced meals throughout the  week. This includes: ? Fruit: 2-3 servings a day ? Vegetables: 4-5 servings a day ? Low-fat dairy: 2 servings a day ? Fish, poultry, or lean meat: 1 serving a day ? Beans and legumes: 2 or more servings a week ? Nuts and seeds: 1-2 servings a day ? Whole grains: 6-8 servings a day ? Extra-virgin olive oil: 3-4 servings a day  Limit red meat and sweets to only a few servings a month What are my food choices?  Mediterranean diet ? Recommended ? Grains: Whole-grain pasta. Brown rice. Bulgar wheat. Polenta. Couscous. Whole-wheat bread. [place]. ? Vegetables: Artichokes. Beets. Broccoli. Cabbage. Carrots. Eggplant. Green beans. Chard. Kale. Spinach. Onions. Leeks. Peas. Squash. Tomatoes. Peppers. Radishes. ? Fruits: Apples. Apricots. Avocado. Berries. Bananas. Cherries. Dates. Figs. Grapes. Lemons. Melon. Oranges. Peaches. Plums. Pomegranate. ?  Meats and other protein foods: Beans. Almonds. Sunflower seeds. Pine nuts. Peanuts. [place]. Salmon. Scallops. Shrimp. [place]. Tilapia. Clams. Oysters. Eggs. ? Dairy: Low-fat milk. Cheese. Greek yogurt. ? Beverages: Water. Red wine. Herbal tea. ? Fats and oils: Extra virgin olive oil. Avocado oil. Grape seed oil. ? Sweets and desserts: [place] yogurt with honey. Baked apples. Poached pears. Trail mix. ? Seasoning and other foods: Basil. Cilantro. Coriander. Cumin. Mint. Parsley. Sage. Rosemary. Tarragon. Garlic. Oregano. Thyme. Pepper. Balsalmic vinegar. Tahini. Hummus. Tomato sauce. Olives. Mushrooms. ? Limit these ? Grains: Prepackaged pasta or rice dishes. Prepackaged cereal with added sugar. ? Vegetables: Deep fried potatoes (french fries). ? Fruits: Fruit canned in syrup. ? Meats and other protein foods: Beef. Pork. Lamb. Poultry with skin. Hot dogs. [place] Salines. ? Dairy: Ice cream. Sour cream. Whole milk. ? Beverages: Juice. Sugar-sweetened soft drinks. Beer. Liquor and spirits. ? Fats and oils: Butter. Canola oil. Vegetable oil. Beef fat (tallow).  Lard. ? Sweets and desserts: Cookies. Cakes. Pies. Candy. ? Seasoning and other foods: Mayonnaise. Premade sauces and marinades. ? The items listed may not be a complete list. Talk with your dietitian about what dietary choices are right for you. Summary  The Mediterranean diet includes both food and lifestyle choices.  Eat a variety of fresh fruits and vegetables, beans, nuts, seeds, and whole grains.  Limit the amount of red meat and sweets that you eat.  Talk with your health care provider about whether it is safe for you to drink red wine in moderation. This means 1 glass a day for nonpregnant women and 2 glasses a day for men. A glass of wine equals 5 oz (150 mL). This information is not intended to replace advice given to you by your health care provider. Make sure you discuss any questions you have with your health care provider. Document Released: 03/20/2016 Document Revised: 04/22/2016 Document Reviewed: 03/20/2016 Elsevier Interactive Patient Education  2019 [employer].

## 2018-12-02 NOTE — Progress Notes (Signed)
Subjective:    Madison Mack is a 23 y.o. female who presents for evaluation of amenorrhea since her IUD was removed 2 months ago. Her last menstrual period was in 2017 prior to her first pregnancy. At that time she had regular periods every 28-30 days lasting for 4-5 days of varying flow. She delivered a full term baby at [redacted]w[redacted]d after a couple episodes of preterm contractions.  Following her pregnancy she had a Mirena IUD placed in May of 2018. She had 2 episodes of irregular light bleeding during the almost 2 years she had the IUD. She had the IUD removed in February of this year in anticipation of a pregnancy.  Prior to having the Mirena the patient had never used hormonal birth control.  She has been using at home ovulation test kits and admits a positive test every 2 weeks for the past month although she has not had a period. She took a pregnancy test 2 days ago that was negative. She and her husband have been actively trying to conceive since the IUD was removed.   The patient does admit anxiety related to the amenorrhea and to current life situations including changes related to Covid 19 and having a toddler to care for. She also has nausea and vomiting which she attributes to her allergies/post nasal drip. She takes benadryl to control her sinus drainage every other day. She uses dramamine to help control her nausea.   Discussion of nature of return to fertility following LARC- it may take several months. Also discussed role of healthy lifestyle including stress reduction for optimal hormone function.    No LMP recorded. (Menstrual status: amenorrhea).  Review of Systems Review of Systems  Constitutional: Positive for malaise/fatigue.       Change in appetite  HENT: Positive for congestion.   Eyes: Negative.   Respiratory: Negative.   Cardiovascular: Negative.   Gastrointestinal: Positive for nausea and vomiting.  Genitourinary: Negative.   Musculoskeletal: Negative.   Skin:  Negative.   Endo/Heme/Allergies: Positive for environmental allergies.       Hot flashes  Psychiatric/Behavioral:       Anxiety   Breast: Tenderness   Objective:   Vital Signs: BP 122/74   Ht 5\' 7"  (1.702 m)   Wt 275 lb (124.7 kg)   BMI 43.07 kg/m  Constitutional: well developed, obese female in no acute distress.  HEENT: normal Skin: Warm and dry.  Cardiovascular: Regular rate and rhythm.   Respiratory:  Normal respiratory effort Neuro: DTRs 2+, Cranial nerves grossly intact Psych: Alert and Oriented x3. No memory deficits. Normal mood and affect.    Assessment:   23 yo G1 P56 female with amenorrhea secondary to discontinuation of IUD  Greater than 50% of the visit was spent in consultation with the patient. Plan:   Discussed the evaluation and treatment of amenorrhea with the patient. Follow up with labs as needed Education materials Weight goals: healthy diet with decreased sugar/carbohydrates Exercise goals: increase physical activity Stress reduction techniques Started Provera challenge   Parke Poisson, CNM [facility] 12/02/2018, 12:05 PM

## 2019-02-07 ENCOUNTER — Ambulatory Visit: Payer: BC Managed Care – PPO | Admitting: Gastroenterology

## 2019-02-15 ENCOUNTER — Encounter: Payer: Self-pay | Admitting: Gastroenterology

## 2019-02-15 ENCOUNTER — Ambulatory Visit (INDEPENDENT_AMBULATORY_CARE_PROVIDER_SITE_OTHER): Payer: BC Managed Care – PPO | Admitting: Gastroenterology

## 2019-02-15 ENCOUNTER — Other Ambulatory Visit: Payer: Self-pay

## 2019-02-15 DIAGNOSIS — R112 Nausea with vomiting, unspecified: Secondary | ICD-10-CM | POA: Diagnosis not present

## 2019-02-15 DIAGNOSIS — R1013 Epigastric pain: Secondary | ICD-10-CM

## 2019-02-15 MED ORDER — PANTOPRAZOLE SODIUM 20 MG PO TBEC: 20.0000 mg | DELAYED_RELEASE_TABLET | Freq: Two times a day (BID) | ORAL | 0 refills | Status: DC

## 2019-02-15 NOTE — Progress Notes (Signed)
Madison BouillonVarnita Lonney Mack [address]  [address]  [place], [place] [address]  Main: [phone]  Fax: [phone]   Gastroenterology Consultation  Referring Provider:     Saralyn PilarKaramalegos, Alexander * Primary Care Physician:  Smitty CordsKaramalegos, Alexander J, DO Reason for Consultation:     Nausea vomiting        HPI:   Virtual Visit via Video Note  I connected with patient on 02/15/19 at  9:15 AM EDT by video ([email or web address]) and verified that I am speaking with the correct person using two identifiers.   I discussed the limitations, risks, security and privacy concerns of performing an evaluation and management service by video and the availability of in person appointments. I also discussed with the patient that there may be a patient responsible charge related to this service. The patient expressed understanding and agreed to proceed.  Location of the patient: Home Location of provider: Home Participating persons: Patient and provider only (Nursing staff checked in patient via phone but were not physically involved in the video interaction - see their notes)   History of Present Illness: Chief Complaint  Patient presents with  . New Patient (Initial Visit)    referred for N/V    Madison Mack is a 23 y.o. y/o female referred for consultation & management  by Dr. Althea CharonKaramalegos, Netta NeatAlexander J, DO.  Patient reports 5731-month history of epigastric abdominal pain and associated nausea and vomiting.  Symptoms occur with or without eating.  No dysphagia.  Occasional heartburn.  Emesis occurs about twice a week without blood.  Patient states prior to 6 months, she would only have these episodes occasionally about once every 2 months.  Now it is occurring more frequently.  No altered bowel habits.  Reports an isolated episode of bright red blood in stool about 1 to 2 weeks ago when she had diarrhea.  This was a one-time episode and has since resolved.  Reports regular bowel movements daily.  1-2 formed  bowel movements daily.  No melena.  Advil listed on her medication list, patient states she only takes it occasionally.  Last use was at least 2 to 3 weeks ago.  Also uses CBD products for abdominal pain.  Next No prior EGD or colonoscopy.  No family history of GI malignancy.  [place] to her symptoms.  Was given Protonix and was told she may have ulcers.  She was discharged from the ER with no GI work-up otherwise.  Patient is also actively trying to conceive but states home pregnancy test last week was negative.  Past Medical History:  Diagnosis Date  . Allergy   . Anxiety   . HA (headache)     Past Surgical History:  Procedure Laterality Date  . ANKLE FRACTURE SURGERY Right 04/08    Prior to Admission medications   Medication Sig Start Date End Date Taking? Authorizing Provider  acetaminophen (TYLENOL) 500 MG tablet Take 500 mg by mouth every 6 (six) hours as needed.   Yes [provider]  dimenhyDRINATE (DRAMAMINE) 50 MG tablet Take 50 mg by mouth every 8 (eight) hours as needed.   Yes [provider]  diphenhydrAMINE (BENADRYL) 25 MG tablet Take 25 mg by mouth every 6 (six) hours as needed.   Yes [provider]  ibuprofen (ADVIL) 400 MG tablet Take 400 mg by mouth every 6 (six) hours as needed.   Yes [provider]  pantoprazole (PROTONIX) 20 MG tablet Take 1 tablet (20 mg total) by mouth 2 (two) times daily. 02/15/19 03/17/19  Virgel Manifold, MD    Family History  Problem Relation Age of Onset  . Hyperlipidemia Father   . Hypertension Mother   . ADD / ADHD Maternal Aunt   . Alcohol abuse Maternal Aunt   . Drug abuse Maternal Aunt   . Anxiety disorder Maternal Aunt   . Alcohol abuse Maternal Uncle   . Drug abuse Maternal Uncle   . Anxiety disorder Paternal Grandfather   . Depression Paternal Grandfather   . Dementia Paternal Grandmother      Social History    Tobacco Use  . Smoking status: Never Smoker  . Smokeless tobacco: Never Used  Substance Use Topics  . Alcohol use: No    Alcohol/week: 0.0 standard drinks  . Drug use: No    Allergies as of 02/15/2019 - Review Complete 02/15/2019  Allergen Reaction Noted  . Penicillins Rash 02/22/2015  . Amoxicillin  09/08/2018  . Doxycycline Hives 08/25/2017    Review of Systems:    All systems reviewed and negative except where noted in HPI.   Observations/Objective:  Labs: CBC    Component Value Date/Time   WBC 15.1 (H) 08/02/2015 1707   RBC 5.36 (H) 08/02/2015 1707   HGB 12.7 08/02/2015 1707   HGB 13.7 10/31/2012 1309   HCT 40.4 08/02/2015 1707   HCT 41.6 10/31/2012 1309   PLT 257 08/02/2015 1707   PLT 188 10/31/2012 1309   MCV 75.4 (L) 08/02/2015 1707   MCV 79 (L) 10/31/2012 1309   MCH 23.8 (L) 08/02/2015 1707   MCHC 31.5 (L) 08/02/2015 1707   RDW 15.6 (H) 08/02/2015 1707   RDW 15.7 (H) 10/31/2012 1309   LYMPHSABS 0.3 (L) 10/31/2012 1309   MONOABS 0.4 10/31/2012 1309   EOSABS 0.0 10/31/2012 1309   BASOSABS 0.1 10/31/2012 1309   CMP     Component Value Date/Time   NA 133 (L) 08/02/2015 1707   NA 141 10/31/2012 1309   K 4.0 08/02/2015 1707   K 3.5 10/31/2012 1309   CL 105 08/02/2015 1707   CL 100 10/31/2012 1309   CO2 21 (L) 08/02/2015 1707   CO2 26 (H) 10/31/2012 1309   GLUCOSE 105 (H) 08/02/2015 1707   GLUCOSE 102 (H) 10/31/2012 1309   BUN 16 08/02/2015 1707   BUN 10 10/31/2012 1309   CREATININE 0.74 08/02/2015 1707   CREATININE 0.85 10/31/2012 1309   CALCIUM 9.1 08/02/2015 1707   CALCIUM 9.3 10/31/2012 1309   PROT 8.6 (H) 08/02/2015 1707   PROT 8.4 10/31/2012 1309   ALBUMIN 4.3 08/02/2015 1707   ALBUMIN 3.8 10/31/2012 1309   AST 13 (L) 08/02/2015 1707   AST 15 10/31/2012 1309   ALT 14 08/02/2015 1707   ALT 20 10/31/2012 1309   ALKPHOS 91 08/02/2015 1707   ALKPHOS 100 10/31/2012 1309   BILITOT 0.8 08/02/2015 1707   BILITOT 0.5 10/31/2012 1309    GFRNONAA >60 08/02/2015 1707   GFRAA >60 08/02/2015 1707    Imaging Studies: No results found.  Assessment and Plan:   Madison Mack is a 23 y.o. y/o female has been referred for nausea and vomiting  Assessment and Plan: Patient reports nausea vomiting and abdominal pain  We will start with H. pylori serology  We will also obtain labs to evaluate liver enzymes and for anemia  Patient states she had an ultrasound while she was at  the ER 2 weeks ago and she was told everything was normal.  I do not have this report.  I have also informed her about cyclical vomiting syndrome associated with CBD or marijuana products and advised her to decrease or abstain from use.  She verbalized understanding.  We discussed the options of proceeding with upper endoscopy if above work-up is negative.  However, patient is actively trying to conceive and since she could become pregnant between today and scheduling her procedure for another date, it would be best to see if conservative management and above work-up leads to results prior to scheduling her procedure.  She is agreeable with this plan.  We will increase her Protonix to twice daily to see if it helps with her symptoms.  (Risks of PPI use were discussed with patient including bone loss, C. Diff diarrhea, pneumonia, infections, CKD, electrolyte abnormalities.  If clinically possible based on symptoms, goal would be to maintain patient on the lowest dose possible, or discontinue the medication with institution of acid reflux lifestyle modifications over time. Pt. Verbalizes understanding and chooses to continue the medication.)  Protonix only to be used for 30 days on a short-term basis   Follow Up Instructions: Follow-up in 2 to 3 months  I discussed the assessment and treatment plan with the patient. The patient was provided an opportunity to ask questions and all were answered. The patient agreed with the plan and demonstrated an  understanding of the instructions.   The patient was advised to call back or seek an in-person evaluation if the symptoms worsen or if the condition fails to improve as anticipated.  I provided 30 minutes of face-to-face time via video software during this encounter.  Additional time was spent in reviewing patient's chart, placing orders etc.   Pasty SpillersVarnita B Darika Ildefonso, MD  Speech recognition software was used to dictate the above note.

## 2019-02-16 LAB — HEPATIC FUNCTION PANEL
ALT: 22 IU/L (ref 0–32)
AST: 14 IU/L (ref 0–40)
Albumin: 3.9 g/dL (ref 3.9–5.0)
Alkaline Phosphatase: 78 IU/L (ref 39–117)
Bilirubin Total: 0.4 mg/dL (ref 0.0–1.2)
Bilirubin, Direct: 0.11 mg/dL (ref 0.00–0.40)
Total Protein: 6.7 g/dL (ref 6.0–8.5)

## 2019-02-16 LAB — CBC
Hematocrit: 42 % (ref 34.0–46.6)
Hemoglobin: 13.6 g/dL (ref 11.1–15.9)
MCH: 28.9 pg (ref 26.6–33.0)
MCHC: 32.4 g/dL (ref 31.5–35.7)
MCV: 89 fL (ref 79–97)
Platelets: 208 10*3/uL (ref 150–450)
RBC: 4.7 x10E6/uL (ref 3.77–5.28)
RDW: 13 % (ref 11.7–15.4)
WBC: 8 10*3/uL (ref 3.4–10.8)

## 2019-02-16 LAB — PREGNANCY, URINE: Preg Test, Ur: NEGATIVE

## 2019-02-17 LAB — H PYLORI, IGM, IGG, IGA AB
H pylori, IgM Abs: <9 units (ref 0.0–8.9)
H. pylori, IgA Abs: <9 units (ref 0.0–8.9)
H. pylori, IgG AbS: 0.3 Index Value (ref 0.00–0.79)

## 2019-02-21 ENCOUNTER — Telehealth: Payer: Self-pay | Admitting: Gastroenterology

## 2019-02-21 NOTE — Telephone Encounter (Signed)
Patient called in & would like her lab results.

## 2019-02-21 NOTE — Telephone Encounter (Signed)
Pt is calling for Lab results [phone]

## 2019-02-23 NOTE — Telephone Encounter (Signed)
Pt has been notified of results and verbalized understanding  

## 2019-03-17 ENCOUNTER — Other Ambulatory Visit: Payer: Self-pay

## 2019-03-17 ENCOUNTER — Encounter: Payer: Self-pay | Admitting: Family Medicine

## 2019-03-17 ENCOUNTER — Ambulatory Visit (INDEPENDENT_AMBULATORY_CARE_PROVIDER_SITE_OTHER): Payer: BC Managed Care – PPO | Admitting: Family Medicine

## 2019-03-17 VITALS — BP 133/72 | HR 77 | Temp 98.9°F | Resp 16 | Ht 67.0 in | Wt 263.0 lb

## 2019-03-17 DIAGNOSIS — N3001 Acute cystitis with hematuria: Secondary | ICD-10-CM | POA: Diagnosis not present

## 2019-03-17 LAB — POCT URINALYSIS DIPSTICK
Bilirubin, UA: NEGATIVE
Glucose, UA: NEGATIVE
Ketones, UA: NEGATIVE
Nitrite, UA: NEGATIVE
Protein, UA: POSITIVE — AB
Spec Grav, UA: 1.01 (ref 1.010–1.025)
Urobilinogen, UA: 0.2 E.U./dL
pH, UA: 8 (ref 5.0–8.0)

## 2019-03-17 MED ORDER — NITROFURANTOIN MONOHYD MACRO 100 MG PO CAPS: 100.0000 mg | ORAL_CAPSULE | Freq: Two times a day (BID) | ORAL | 0 refills | Status: DC

## 2019-03-17 NOTE — Patient Instructions (Addendum)
Thank you for coming to the office today.  1. You have a Urinary Tract Infection - this is very common, your symptoms are reassuring and you should get better within 1 week on the antibiotics - Start Macrobid 100mg  2 times daily for next 5 days, complete entire course, even if feeling better - We sent urine for a culture, we will call you within next few days if we need to change antibiotics - Please drink plenty of fluids, improve hydration over next 1 week  If symptoms worsening, developing nausea / vomiting, worsening back pain, fevers / chills / sweats, then please return for re-evaluation sooner.  To prevent bladder and kidney infections...  Drink enough water to keep your pee clear to pale yellow  If you think you are getting another bladder infection, start drinking cranberry juice and come see [place] so we can check the urine.   Please schedule a Follow-up Appointment to: Return in about 1 week (around 03/24/2019), or if symptoms worsen or fail to improve, for UTI.  If you have any other questions or concerns, please feel free to call the office or send a message through [place]. You may also schedule an earlier appointment if necessary.  Additionally, you may be receiving a survey about your experience at our office within a few days to 1 week by e-mail or mail. We value your feedback.  Nobie Putnam, DO [place]

## 2019-03-17 NOTE — Progress Notes (Signed)
Subjective:    Patient ID: Madison Cannershley Taylor Morton, female    DOB: 1996-04-03, 23 y.o.   MRN: [id number]  Madison Mack is a 23 y.o. female presenting on 03/17/2019 for Urinary Tract Infection (onset 2 days frequency, burning )   HPI   UTI Reported symptoms 2 days ago with increased urinary frequency and dysuria, some reduced urine output. She admits some improved burning but still has urinary symptoms. She has not had prior UTI. Trying to improve hydration to flush things out. She had some recent low back pain but admits this is typical for her menstrual cycle. Now on menstrual cycle with some bleeding unable to tell if hematuria. - Denies any fevers, chills, flank pain, nausea vomiting  Follow-up GI Reviewed 02/2019, [facility] Dr Maximino Greenlandahiliani, did further testing including H Pylori negative. Increased PPI Protonix to BID, doing well now with resolved symptoms.   [facility] 03/17/2019 09/28/2018 09/08/2018  Decreased Interest 0 0 0  Down, Depressed, Hopeless 0 0 1  PHQ - 2 Score 0 0 1  Altered sleeping - 0 0  Tired, decreased energy - 0 1  Change in appetite - 0 0  Feeling bad or failure about yourself  - 0 0  Trouble concentrating - 0 1  Moving slowly or fidgety/restless - 0 0  Suicidal thoughts - 0 0  PHQ-9 Score - 0 3  Difficult doing work/chores - Not difficult at all Not difficult at all    Social History   Tobacco Use  . Smoking status: Never Smoker  . Smokeless tobacco: Never Used  Substance Use Topics  . Alcohol use: No    Alcohol/week: 0.0 standard drinks  . Drug use: No    Review of Systems Per HPI unless specifically indicated above     Objective:    BP 133/72   Pulse 77   Temp 98.9 F (37.2 C) (Oral)   Resp 16   Ht 5\' 7"  (1.702 m)   Wt 263 lb (119.3 kg)   BMI 41.19 kg/m   Wt Readings from Last 3 Encounters:  03/17/19 263 lb (119.3 kg)  12/02/18 275 lb (124.7 kg)  09/28/18 282 lb 9.6 oz (128.2 kg)    Physical Exam Vitals signs and nursing  note reviewed.  Constitutional:      General: She is not in acute distress.    Appearance: She is well-developed. She is not diaphoretic.     Comments: Well-appearing, comfortable, cooperative  HENT:     Head: Normocephalic and atraumatic.  Eyes:     General:        Right eye: No discharge.        Left eye: No discharge.     Conjunctiva/sclera: Conjunctivae normal.  Cardiovascular:     Rate and Rhythm: Normal rate.  Pulmonary:     Effort: Pulmonary effort is normal.  Skin:    General: Skin is warm and dry.     Findings: No erythema or rash.  Neurological:     Mental Status: She is alert and oriented to person, place, and time.  Psychiatric:        Behavior: Behavior normal.     Comments: Well groomed, good eye contact, normal speech and thoughts    Results for orders placed or performed in visit on 03/17/19  POCT urinalysis dipstick  Result Value Ref Range   Color, UA yellow-orange    Clarity, UA cloudy    Glucose, UA Negative Negative   Bilirubin, UA  neg    Ketones, UA neg    Spec Grav, UA 1.010 1.010 - 1.025   Blood, UA large    pH, UA 8.0 5.0 - 8.0   Protein, UA Positive (A) Negative   Urobilinogen, UA 0.2 0.2 or 1.0 E.U./dL   Nitrite, UA negative    Leukocytes, UA Trace (A) Negative   Appearance     Odor none       Assessment & Plan:   Problem List Items Addressed This Visit    None    Visit Diagnoses    Acute cystitis with hematuria    -  Primary   Relevant Medications   nitrofurantoin, macrocrystal-monohydrate, (MACROBID) 100 MG capsule   Other Relevant Orders   POCT urinalysis dipstick (Completed)   Urine Culture      Clinically consistent with UTI and confirmed on UA. No recent UTIs or abx courses. No prior UTI No known risk factors for recurrent UTI. No concern for pyelo today (no systemic symptoms, neg fever, back pain, n/v).  Plan: 1. Urinalysis dipstick, lg blood likely from menstrual, other possible positive results 2. Ordered Urine  culture 3. Macrobid 100mg  BID x 5 days 4. Improve PO hydration - Caution OTC AZO if trial PRN, use < 3 days 5. RTC if no improvement 1-2 weeks, red flags given to return sooner   Meds ordered this encounter  Medications  . nitrofurantoin, macrocrystal-monohydrate, (MACROBID) 100 MG capsule    Sig: Take 1 capsule (100 mg total) by mouth 2 (two) times daily. For 5 days    Dispense:  10 capsule    Refill:  0     Follow up plan: Return in about 1 week (around 03/24/2019), or if symptoms worsen or fail to improve, for UTI.   Nobie Putnam, [facility] 03/17/2019, 8:19 AM

## 2019-03-19 LAB — URINE CULTURE
MICRO NUMBER:: 744536
SPECIMEN QUALITY:: ADEQUATE

## 2019-04-11 ENCOUNTER — Emergency Department: Payer: BC Managed Care – PPO

## 2019-04-11 ENCOUNTER — Encounter: Payer: Self-pay | Admitting: Emergency Medicine

## 2019-04-11 ENCOUNTER — Emergency Department
Admission: EM | Admit: 2019-04-11 | Discharge: 2019-04-11 | Disposition: A | Discharge status: Home / Self Care | Payer: BC Managed Care – PPO | Attending: Emergency Medicine | Admitting: Emergency Medicine

## 2019-04-11 ENCOUNTER — Other Ambulatory Visit: Payer: Self-pay

## 2019-04-11 DIAGNOSIS — M545 Low back pain, unspecified: Secondary | ICD-10-CM

## 2019-04-11 DIAGNOSIS — M62830 Muscle spasm of back: Secondary | ICD-10-CM | POA: Diagnosis not present

## 2019-04-11 LAB — URINALYSIS, COMPLETE (UACMP) WITH MICROSCOPIC
Bacteria, UA: NONE SEEN
Bilirubin Urine: NEGATIVE
Glucose, UA: NEGATIVE mg/dL
Hgb urine dipstick: NEGATIVE
Ketones, ur: NEGATIVE mg/dL
Nitrite: NEGATIVE
Protein, ur: NEGATIVE mg/dL
Specific Gravity, Urine: 1.012 (ref 1.005–1.030)
pH: 6 (ref 5.0–8.0)

## 2019-04-11 LAB — POCT PREGNANCY, URINE: Preg Test, Ur: NEGATIVE

## 2019-04-11 MED ORDER — METHOCARBAMOL 500 MG PO TABS
1000.0000 mg | ORAL_TABLET | Freq: Once | ORAL | Status: AC
  Administered 2019-04-11: 1000 mg via ORAL
  Filled 2019-04-11: qty 2

## 2019-04-11 MED ORDER — KETOROLAC TROMETHAMINE 30 MG/ML IJ SOLN
30.0000 mg | Freq: Once | INTRAMUSCULAR | Status: AC
  Administered 2019-04-11: 30 mg via INTRAMUSCULAR
  Filled 2019-04-11: qty 1

## 2019-04-11 MED ORDER — HYDROCODONE-ACETAMINOPHEN 5-325 MG PO TABS: 1.0000 | ORAL_TABLET | Freq: Four times a day (QID) | ORAL | 0 refills | Status: DC | PRN

## 2019-04-11 MED ORDER — METHOCARBAMOL 500 MG PO TABS: ORAL_TABLET | ORAL | 0 refills | Status: DC

## 2019-04-11 MED ORDER — NABUMETONE 500 MG PO TABS: 500.0000 mg | ORAL_TABLET | Freq: Two times a day (BID) | ORAL | 0 refills | Status: DC

## 2019-04-11 NOTE — ED Triage Notes (Signed)
Patient reports intermittent lower back pain x1 month. States pain is worse when twisting or when sitting or standing in the same position for extended amounts of time.

## 2019-04-11 NOTE — Discharge Instructions (Signed)
Follow-up with your primary care provider if any continued problems.  Begin taking medication only as directed.  The muscle relaxant could cause drowsiness and increase your risk for injury as well as the pain medication Norco.  Do not take these 2 medications if you plan on driving or being away from home.  The Relafen can be taken while driving or working and is for inflammation and pain.  Use ice or heat to your lower back as needed for discomfort.

## 2019-04-11 NOTE — ED Notes (Signed)
See triage note  Presents with lower back pain  States she has had some spasm like pain for about 1 month    States the pain has been tolerable   But last pm and this am the pain is worse  Denies any injury or urinary sxs'

## 2019-04-11 NOTE — ED Provider Notes (Signed)
[facility] Emergency Department Provider Note  ____________________________________________   First MD Initiated Contact with Patient 04/11/19 [phone]     (approximate)  I have reviewed the triage vital signs and the nursing notes.   HISTORY  Chief Complaint Back Pain   HPI Madison Mack is a 24 y.o. female presents to the ED with complaint of low back pain intermittently for 1 month.  Patient denies any history of injury but states that she has had problems with her back off and on for 2 years since her epidural.  Patient states that pain is increased with movement or standing for long periods of time.  She has taken some over-the-counter medication without any relief.  She denies any urinary symptoms or history of kidney stones.  There is been no saddle anesthesias or incontinence of bowel or bladder.  Patient continues to ambulate without any assistance.  She rates her pain as a 10/10.       Past Medical History:  Diagnosis Date  . Allergy   . Anxiety   . HA (headache)     Patient Active Problem List   Diagnosis Date Noted  . MDD (major depressive disorder), recurrent episode, mild (HCC) 09/08/2018  . Cyclic vomiting syndrome 09/08/2018  . ADD (attention deficit disorder) 09/08/2018    Past Surgical History:  Procedure Laterality Date  . ANKLE FRACTURE SURGERY Right 04/08    Prior to Admission medications   Medication Sig Start Date End Date Taking? Authorizing Provider  acetaminophen (TYLENOL) 500 MG tablet Take 500 mg by mouth every 6 (six) hours as needed.    [provider]  dimenhyDRINATE (DRAMAMINE) 50 MG tablet Take 50 mg by mouth every 8 (eight) hours as needed.    [provider]  diphenhydrAMINE (BENADRYL) 25 MG tablet Take 25 mg by mouth every 6 (six) hours as needed.    [provider]  HYDROcodone-acetaminophen (NORCO/VICODIN) 5-325 MG tablet Take 1 tablet by mouth every 6 (six) hours as needed for  moderate pain. 04/11/19   Tommi Rumps, PA-C  ibuprofen (ADVIL) 400 MG tablet Take 400 mg by mouth every 6 (six) hours as needed.    [provider]  methocarbamol (ROBAXIN) 500 MG tablet 1 or 2 tablets every 6 hours as needed for muscle spasms. 04/11/19   Tommi Rumps, PA-C  nabumetone (RELAFEN) 500 MG tablet Take 1 tablet (500 mg total) by mouth 2 (two) times daily. 04/11/19   Tommi Rumps, PA-C  nitrofurantoin, macrocrystal-monohydrate, (MACROBID) 100 MG capsule Take 1 capsule (100 mg total) by mouth 2 (two) times daily. For 5 days 03/17/19   Smitty Cords, DO  pantoprazole (PROTONIX) 20 MG tablet Take 1 tablet (20 mg total) by mouth 2 (two) times daily. 02/15/19 03/17/19  Pasty Spillers, MD    Allergies Penicillins, Amoxicillin, and Doxycycline  Family History  Problem Relation Age of Onset  . Hyperlipidemia Father   . Hypertension Mother   . ADD / ADHD Maternal Aunt   . Alcohol abuse Maternal Aunt   . Drug abuse Maternal Aunt   . Anxiety disorder Maternal Aunt   . Alcohol abuse Maternal Uncle   . Drug abuse Maternal Uncle   . Anxiety disorder Paternal Grandfather   . Depression Paternal Grandfather   . Dementia Paternal Grandmother     Social History Social History   Tobacco Use  . Smoking status: Never Smoker  . Smokeless tobacco: Never Used  Substance Use Topics  .  Alcohol use: No    Alcohol/week: 0.0 standard drinks  . Drug use: No    Review of Systems Constitutional: No fever/chills Cardiovascular: Denies chest pain. Respiratory: Denies shortness of breath. Gastrointestinal: No abdominal pain.  No nausea, no vomiting.  No diarrhea.  No constipation. Genitourinary: Negative for dysuria.  Negative for hematuria. Musculoskeletal: Positive for low back pain. Skin: Negative for rash. Neurological: Negative for headaches, focal weakness or numbness. ___________________________________________   PHYSICAL EXAM:  VITAL SIGNS: ED  Triage Vitals [04/11/19 0804]  Enc Vitals Group     BP 132/70     Pulse Rate 73     Resp 18     Temp 99.3 F (37.4 C)     Temp Source Oral     SpO2 100 %     Weight 255 lb (115.7 kg)     Height 5\' 9"  (1.753 m)     Head Circumference      Peak Flow      Pain Score 10     Pain Loc      Pain Edu?      Excl. in GC?    Constitutional: Alert and oriented. Well appearing and in no acute distress. Eyes: Conjunctivae are normal.  Head: Atraumatic. Neck: No stridor.   Cardiovascular: Normal rate, regular rhythm. Grossly normal heart sounds.  Good peripheral circulation. Respiratory: Normal respiratory effort.  No retractions. Lungs CTAB. Gastrointestinal: Soft and nontender. No distention.  No CVA tenderness. Musculoskeletal: On examination of the thoracic and lumbar spine there is no gross deformity however range of motion is restricted secondary to discomfort.  There is moderate tenderness on palpation of the paravertebral muscles bilaterally lumbar spine.  Straight leg raises were negative.  Muscle strength bilaterally. Neurologic:  Normal speech and language. No gross focal neurologic deficits are appreciated.  Reflexes were 2+ bilaterally.  No gait instability. Skin:  Skin is warm, dry and intact. No rash noted. Psychiatric: Mood and affect are normal. Speech and behavior are normal.  ____________________________________________   LABS (all labs ordered are listed, but only abnormal results are displayed)  Labs Reviewed  URINALYSIS, COMPLETE (UACMP) WITH MICROSCOPIC - Abnormal; Notable for the following components:      Result Value   Color, Urine YELLOW (*)    APPearance CLEAR (*)    Leukocytes,Ua TRACE (*)    All other components within normal limits  POC URINE PREG, ED  POCT PREGNANCY, URINE    RADIOLOGY   Official radiology report(s): Dg Lumbar Spine 2-3 Views  Result Date: 04/11/2019 CLINICAL DATA:  Low back pain and spasms for 1 month which acutely worsened last  night and this morning. No known injury. EXAM: LUMBAR SPINE - 2-3 VIEW COMPARISON:  None. FINDINGS: There is no evidence of lumbar spine fracture. Scattered small Schmorl's nodes are noted. Alignment is normal. Intervertebral disc spaces are maintained. IMPRESSION: No acute or focal abnormality.  Scattered small Schmorl's nodes. Electronically Signed   By: Drusilla Kannerhomas  Dalessio M.D.   On: 04/11/2019 10:42    ____________________________________________   PROCEDURES  Procedure(s) performed (including Critical Care):  Procedures   ____________________________________________   INITIAL IMPRESSION / ASSESSMENT AND PLAN / ED COURSE  As part of my medical decision making, I reviewed the following data within the electronic MEDICAL RECORD NUMBER Notes from prior ED visits and [place] Controlled Substance Database   23 year old female presents to the ED with complaint of low back pain for 1 month or minimally but has had pain off and on  for the last 2 years after having an epidural.  Urinalysis was negative and lumbar spine was negative for acute bony injury but did show scattered Schmorl nodes.  While in the ED patient was given Toradol 30 mg IM and methocarbamol 1000 mg p.o.  Prior to discharge patient states that the muscle spasms are lessened.  She was discharged with a prescription for Norco as needed for pain, methocarbamol 1 or 2 tablets every 6 hours and Relafen twice daily as needed for pain and inflammation.  She is aware that she cannot take the first 2 medications and drive or operate machinery.  She is to follow-up with her PCP if any continued problems.  ____________________________________________   FINAL CLINICAL IMPRESSION(S) / ED DIAGNOSES  Final diagnoses:  Acute bilateral low back pain without sciatica  Muscle spasm of back     ED Discharge Orders         Ordered    methocarbamol (ROBAXIN) 500 MG tablet     04/11/19 1120    nabumetone (RELAFEN) 500 MG tablet  2 times daily      04/11/19 1120    HYDROcodone-acetaminophen (NORCO/VICODIN) 5-325 MG tablet  Every 6 hours PRN     04/11/19 1120           Note:  This document was prepared using Dragon voice recognition software and may include unintentional dictation errors.    Johnn Hai, PA-C 04/11/19 1322    Duffy Bruce, MD 04/11/19 [phone]

## 2019-04-19 ENCOUNTER — Ambulatory Visit (INDEPENDENT_AMBULATORY_CARE_PROVIDER_SITE_OTHER): Payer: BC Managed Care – PPO | Admitting: Family Medicine

## 2019-04-19 ENCOUNTER — Other Ambulatory Visit: Payer: Self-pay

## 2019-04-19 ENCOUNTER — Encounter: Payer: Self-pay | Admitting: Family Medicine

## 2019-04-19 VITALS — Temp 97.6°F

## 2019-04-19 DIAGNOSIS — M5441 Lumbago with sciatica, right side: Secondary | ICD-10-CM | POA: Diagnosis not present

## 2019-04-19 MED ORDER — BACLOFEN 10 MG PO TABS: 5.0000 mg | ORAL_TABLET | Freq: Three times a day (TID) | ORAL | 0 refills | Status: DC | PRN

## 2019-04-19 MED ORDER — PREDNISONE 10 MG PO TABS: ORAL_TABLET | ORAL | 0 refills | Status: DC

## 2019-04-19 NOTE — Progress Notes (Signed)
Virtual Visit via Telephone The purpose of this virtual visit is to provide medical care while limiting exposure to the novel coronavirus (COVID19) for both patient and office staff.  Consent was obtained for phone visit:  Yes.   Answered questions that patient had about telehealth interaction:  Yes.   I discussed the limitations, risks, security and privacy concerns of performing an evaluation and management service by telephone. I also discussed with the patient that there may be a patient responsible charge related to this service. The patient expressed understanding and agreed to proceed.  Patient Location: Home [facility]  ---------------------------------------------------------------------- Chief Complaint  Patient presents with  . Back Pain    ER visit follow-up  . Hip Pain    S: Reviewed CMA documentation. I have called patient and gathered additional HPI as follows:  ED FOLLOW-UP VISIT  Hospital/Location: [facility] Date of ED Visit: 04/11/19  Reason for Presenting to ED: Back Pain, Bilateral Low Back  FOLLOW-UP - ED provider note and record have been reviewed - Patient presents today about 8 days after recent ED visit. Brief summary of recent course, patient had symptoms of back pain episodic over past 1 month, now worsening, presented to ED on 8/31, testing in ED with Lumbar X-ray see results no significant finding, treated with robaxin, NSAID rx, hydrocodone rx PRN discharged.  - Today reports overall has still had significant back pain. Seems temporarily improved on meds but not resolved. Worse over weekend if move could catch, and would send a sharp pain episodicOvernight had worsening episodic pain, shooting sharp pains  Admits some urinary frequency, without burning. Previous UTI 1 month ago, treated and resolved. No prior nephrolithiasis or colicky pain Denies blood in urine  Patient is currently working as Runner, broadcasting/film/videoteacher, she  cannot due remote right now Denies any high risk travel to areas of current concern for COVID19. Denies any known or suspected exposure to person with or possibly with COVID19.  Denies any fevers, chills, sweats, body ache, cough, shortness of breath, sinus pain or pressure, headache, abdominal pain, diarrhea  Past Medical History:  Diagnosis Date  . Allergy   . Anxiety   . HA (headache)    Social History   Tobacco Use  . Smoking status: Never Smoker  . Smokeless tobacco: Never Used  Substance Use Topics  . Alcohol use: No    Alcohol/week: 0.0 standard drinks  . Drug use: No    Current Outpatient Medications:  .  nabumetone (RELAFEN) 500 MG tablet, Take 1 tablet (500 mg total) by mouth 2 (two) times daily., Disp: 30 tablet, Rfl: 0 .  acetaminophen (TYLENOL) 500 MG tablet, Take 500 mg by mouth every 6 (six) hours as needed., Disp: , Rfl:  .  baclofen (LIORESAL) 10 MG tablet, Take 0.5-1 tablets (5-10 mg total) by mouth 3 (three) times daily as needed for muscle spasms., Disp: 30 each, Rfl: 0 .  dimenhyDRINATE (DRAMAMINE) 50 MG tablet, Take 50 mg by mouth every 8 (eight) hours as needed., Disp: , Rfl:  .  diphenhydrAMINE (BENADRYL) 25 MG tablet, Take 25 mg by mouth every 6 (six) hours as needed., Disp: , Rfl:  .  ibuprofen (ADVIL) 400 MG tablet, Take 400 mg by mouth every 6 (six) hours as needed., Disp: , Rfl:  .  pantoprazole (PROTONIX) 20 MG tablet, Take 1 tablet (20 mg total) by mouth 2 (two) times daily., Disp: 60 tablet, Rfl: 0 .  predniSONE (DELTASONE) 10 MG tablet, Take 6  tabs with breakfast Day 1, 5 tabs Day 2, 4 tabs Day 3, 3 tabs Day 4, 2 tabs Day 5, 1 tab Day 6., Disp: 21 tablet, Rfl: 0  Depression screen [facility] 2/9 04/19/2019 03/17/2019 09/28/2018  Decreased Interest 0 0 0  Down, Depressed, Hopeless 1 0 0  PHQ - 2 Score 1 0 0  Altered sleeping 0 - 0  Tired, decreased energy 3 - 0  Change in appetite 0 - 0  Feeling bad or failure about yourself  0 - 0  Trouble concentrating 1  - 0  Moving slowly or fidgety/restless 0 - 0  Suicidal thoughts 0 - 0  PHQ-9 Score 5 - 0  Difficult doing work/chores Not difficult at all - Not difficult at all    GAD 7 : Generalized Anxiety Score 09/28/2018 09/08/2018  Nervous, Anxious, on Edge 1 2  Control/stop worrying 0 1  Worry too much - different things 1 1  Trouble relaxing 0 1  Restless 0 0  Easily annoyed or irritable 1 2  Afraid - awful might happen 0 0  Total GAD 7 Score 3 7  Anxiety Difficulty Not difficult at all Not difficult at all    -------------------------------------------------------------------------- O: No physical exam performed due to remote telephone encounter.  Lab results reviewed.   I have personally reviewed the radiology report from 04/11/19.  DG Lumbar Spine 2-3 ViewsPerformed 04/11/2019 Final result  Study Result CLINICAL DATA: Low back pain and spasms for 1 month which acutely worsened last night and this morning. No known injury.  EXAM: LUMBAR SPINE - 2-3 VIEW  COMPARISON: None.  FINDINGS: There is no evidence of lumbar spine fracture. Scattered small Schmorl's nodes are noted. Alignment is normal. Intervertebral disc spaces are maintained.  IMPRESSION: No acute or focal abnormality. Scattered small Schmorl's nodes.   Electronically Signed By: Inge Rise M.D. On: 04/11/2019 10:42    Recent Results (from the past 2160 hour(s))  Hepatic function panel     Status: None   Collection Time: 02/15/19 12:51 PM  Result Value Ref Range   Total Protein 6.7 6.0 - 8.5 g/dL   Albumin 3.9 3.9 - 5.0 g/dL   Bilirubin Total 0.4 0.0 - 1.2 mg/dL   Bilirubin, Direct 0.11 0.00 - 0.40 mg/dL   Alkaline Phosphatase 78 39 - 117 IU/L   AST 14 0 - 40 IU/L   ALT 22 0 - 32 IU/L  Pregnancy, urine     Status: None   Collection Time: 02/15/19 12:52 PM  Result Value Ref Range   Preg Test, Ur Negative Negative  CBC     Status: None   Collection Time: 02/15/19 12:52 PM  Result Value Ref Range    WBC 8.0 3.4 - 10.8 x10E3/uL   RBC 4.70 3.77 - 5.28 x10E6/uL   Hemoglobin 13.6 11.1 - 15.9 g/dL   Hematocrit 42.0 34.0 - 46.6 %   MCV 89 79 - 97 fL   MCH 28.9 26.6 - 33.0 pg   MCHC 32.4 31.5 - 35.7 g/dL   RDW 13.0 11.7 - 15.4 %   Platelets 208 150 - 450 x10E3/uL  H Pylori, IGM, IGG, IGA AB     Status: None   Collection Time: 02/15/19 12:53 PM  Result Value Ref Range   H. pylori, IgG AbS 0.30 0.00 - 0.79 Index Value    Comment:  Negative           <0.80                              Equivocal    0.80 - 0.89                              Positive           >0.89    H. pylori, IgA Abs <9.0 0.0 - 8.9 units    Comment:                                 Negative          <9.0                                 Equivocal   9.0 - 11.0                                 Positive         >11.0    H pylori, IgM Abs <9.0 0.0 - 8.9 units    Comment:                                 Negative          <9.0                                 Equivocal   9.0 - 11.0                                 Positive         >11.0 This test was developed and its performance characteristics determined by [facility]. It has not been cleared or approved by the Food and Drug Administration.   POCT urinalysis dipstick     Status: Abnormal   Collection Time: 03/17/19  8:33 AM  Result Value Ref Range   Color, UA yellow-orange    Clarity, UA cloudy    Glucose, UA Negative Negative   Bilirubin, UA neg    Ketones, UA neg    Spec Grav, UA 1.010 1.010 - 1.025   Blood, UA large    pH, UA 8.0 5.0 - 8.0   Protein, UA Positive (A) Negative   Urobilinogen, UA 0.2 0.2 or 1.0 E.U./dL   Nitrite, UA negative    Leukocytes, UA Trace (A) Negative   Appearance     Odor none   Urine Culture     Status: None   Collection Time: 03/17/19 11:20 AM   Specimen: Urine  Result Value Ref Range   MICRO NUMBER: [id number]    SPECIMEN QUALITY: Adequate    Sample Source URINE, CLEAN CATCH    STATUS: FINAL    ISOLATE  1: Staphylococcus saprophyticus     Comment: Staphylococcus saprophyticus The Clinical Laboratory Standards Institute (M100 guidelines), does not advise routine susceptibility testing of urine isolates of S.saprophyticus because infections respond to urinary concentrations of agents commonly used  to treat acute, uncomplicated UTI such as Nitrofurantoin, Trimethoprim-sulfamethoxazole or a fluoroquinolone.   Urinalysis, Complete w Microscopic     Status: Abnormal   Collection Time: 04/11/19  8:38 AM  Result Value Ref Range   Color, Urine YELLOW (A) YELLOW   APPearance CLEAR (A) CLEAR   Specific Gravity, Urine 1.012 1.005 - 1.030   pH 6.0 5.0 - 8.0   Glucose, UA NEGATIVE NEGATIVE mg/dL   Hgb urine dipstick NEGATIVE NEGATIVE   Bilirubin Urine NEGATIVE NEGATIVE   Ketones, ur NEGATIVE NEGATIVE mg/dL   Protein, ur NEGATIVE NEGATIVE mg/dL   Nitrite NEGATIVE NEGATIVE   Leukocytes,Ua TRACE (A) NEGATIVE   RBC / HPF 0-5 0 - 5 RBC/hpf   WBC, UA 0-5 0 - 5 WBC/hpf   Bacteria, UA NONE SEEN NONE SEEN   Squamous Epithelial / LPF 0-5 0 - 5    Comment: Performed at [facility], [address] [place], [place] [address]  Pregnancy, urine POC     Status: None   Collection Time: 04/11/19  8:42 AM  Result Value Ref Range   Preg Test, Ur NEGATIVE NEGATIVE    Comment:        THE SENSITIVITY OF THIS METHODOLOGY IS >24 mIU/mL     -------------------------------------------------------------------------- A&P:  Problem List Items Addressed This Visit    None    Visit Diagnoses    Acute bilateral low back pain with right-sided sciatica    -  Primary   Relevant Medications   predniSONE (DELTASONE) 10 MG tablet   baclofen (LIORESAL) 10 MG tablet   Other Relevant Orders   Ambulatory referral to Orthopedic Surgery     Subacute on chronic bilateral LBP with associated possible sciatica. Suspect likely due to muscle spasm/strain, without known injury or trauma.  In setting of known chronic  LBP without DJD  Prior epidural 2 yr ago pregnancy - No red flag symptoms Temporary responding to conservative therapy  Plan: 1. Prednisone taper 60 to 10mg  over 6 days - can adjust if need 2. Switch muscle relaxant with Baclofen (off robaxin) 10mg  tabs - take 5-10mg  up to TID PRN, titrate up as tolerated, caution sedation 3. May use Tylenol PRN for breakthrough 4. Encouraged use of heating pad 1-2x daily for now then PRN  Referral to [facility] orthopedics for evaluation of acute on chronic low back pain with possible sciatica vs radiation to hip pain, worsening over past 1 month, episodic flares, known chronic low back pain for 2 years following epidural with pregnancy. X-ray lumbar in ED without significant problem 04/11/19, limited result on stronger medication from ED, trial now on prednisone, seeking opinion from Orthopedic and may warrant further imaging possibly MRI if she has any complication from epidural   Orders Placed This Encounter  Procedures  . Ambulatory referral to Orthopedic Surgery    Referral Priority:   Routine    Referral Type:   Surgical    Referral Reason:   Specialty Services Required    Requested Specialty:   Orthopedic Surgery    Number of Visits Requested:   1     Meds ordered this encounter  Medications  . predniSONE (DELTASONE) 10 MG tablet    Sig: Take 6 tabs with breakfast Day 1, 5 tabs Day 2, 4 tabs Day 3, 3 tabs Day 4, 2 tabs Day 5, 1 tab Day 6.    Dispense:  21 tablet    Refill:  0  . baclofen (LIORESAL) 10 MG tablet  Sig: Take 0.5-1 tablets (5-10 mg total) by mouth 3 (three) times daily as needed for muscle spasms.    Dispense:  30 each    Refill:  0    Follow-up: - Return as needed within 4 weeks  Patient verbalizes understanding with the above medical recommendations including the limitation of remote medical advice.  Specific follow-up and call-back criteria were given for patient to follow-up or seek medical care more urgently if  needed.   - Time spent in direct consultation with patient on phone: 12 minutes  Saralyn PilarAlexander Karamalegos, DO [facility] 04/19/2019, 2:00 PM

## 2019-04-19 NOTE — Patient Instructions (Addendum)
  Referral to [facility] [place], [place]  [address] Phone: [phone]  Call them in 1 week if not heard back yet with apt  IF new urinary symptoms or blood in urine or concern infection or stone - please contact us back.   1. For your Back Pain - I think that this is due to Muscle Spasms or strain. Your Sciatic Nerve can be affected causing some of your radiation of pain - Start Prednisone taper (steroid anti-inflammatory) for nerve irritation with pain in legs. Each pill is 10mg . Take 6 pills (60mg  daily) for 1 day at same time with breakfast, then each day reduce dose by 1 pill, so 5 pills, then 4, then 3, then 2 then 1 (last 6 days). Do not take any Ibuprofen or Aleve while taking the Prednisone.  - Once finished Prednisone, then start with other anti-inflammatory Nabutome or Naproxen 500mg  one pill with food every 12 hours (or 2 times a day)  For 1-2 weeks as needed 3. Start Baclofen (Lioresal) (STOP Robaxin methocarbamol) 10mg  tablets - cut in half for 5mg  at night for muscle relaxant - may make you sedated or sleepy (be careful driving or working on this) if tolerated you can take every 8 hours, half or whole tab 4. May use Tylenol Extra Str 500mg  tabs - may take 1-2 tablets every 6 hours as needed 5. Recommend to start using heating pad on your lower back 1-2x daily for few weeks  Also try a Wedge Seat Cushion to avoid nerve pinching when sitting prolonged period of time.  This pain may take weeks to months to fully resolve, but hopefully it will respond to the medicine initially. All back injuries (small or serious) are slow to heal since we use our back muscles every day. Be careful with turning, twisting, lifting, sitting / standing for prolonged periods, and avoid re-injury.  If your symptoms significantly worsen with more pain, or new symptoms with weakness in one or both legs, new or different shooting  leg pains, numbness in legs or groin, loss of control or retention of urine or bowel movements, please call back for advice and you may need to go directly to the Emergency Department.   Please schedule a Follow-up Appointment to: Return in about 4 weeks (around 05/17/2019), or if symptoms worsen or fail to improve, for back pain.  If you have any other questions or concerns, please feel free to call the office or send a message through [facility]. You may also schedule an earlier appointment if necessary.  Additionally, you may be receiving a survey about your experience at our office within a few days to 1 week by e-mail or mail. We value your feedback.  Nobie Putnam, DO [place]

## 2019-04-25 ENCOUNTER — Telehealth: Payer: Self-pay | Admitting: Family Medicine

## 2019-04-25 DIAGNOSIS — M5441 Lumbago with sciatica, right side: Secondary | ICD-10-CM

## 2019-04-25 MED ORDER — BACLOFEN 10 MG PO TABS: 5.0000 mg | ORAL_TABLET | Freq: Three times a day (TID) | ORAL | 0 refills | Status: DC | PRN

## 2019-04-25 MED ORDER — PREDNISONE 10 MG PO TABS: ORAL_TABLET | ORAL | 0 refills | Status: DC

## 2019-04-25 NOTE — Telephone Encounter (Signed)
Pt called said after appt she have fallen down the steps she is have ing moore back pain, also wanted to know if you would give her a refill on her medication. Pt call back # is  [phone]

## 2019-04-25 NOTE — Telephone Encounter (Signed)
She was seen on 9/9. Treated with Prednisone taper over 6 days. Today was her last dose of prednisone.  She was also rx Baclofen muscle relaxant which can be taken as needed.  She should also have Nabumetone 500mg  twice a day anti inflammatory already that can be taken now that she finished Prednisone.  I typically do not repeat course of Prednisone since she just finished the taper.  I called her today. Spoke with her 9/14 at 450pm, she fell on steps slipped on Saturday, had back contusion she thinks and has worsening pain and stiffness, she is improved on baclofen taking TID - needs new rx, and also improved on prednisone, she has [facility] apt on Thursday 04/28/19, she tried to wait it out but was in too much pain going back to work today  I offered 5 day extension on prednisone taper again 50 to 10mg  over 5 days.  Sent rx Baclofen 10mg  TID #90 pills for longer course.  Caution longer prednisone >14 days, advised would limit it and may not finish full 5 day if she prefers.  Nobie Putnam, [facility] 04/25/2019, 4:46 PM

## 2019-05-10 ENCOUNTER — Encounter: Payer: Self-pay | Admitting: Emergency Medicine

## 2019-05-10 ENCOUNTER — Other Ambulatory Visit: Payer: Self-pay

## 2019-05-10 ENCOUNTER — Emergency Department
Admission: EM | Admit: 2019-05-10 | Discharge: 2019-05-10 | Disposition: A | Discharge status: Home / Self Care | Payer: BC Managed Care – PPO | Attending: Emergency Medicine | Admitting: Emergency Medicine

## 2019-05-10 DIAGNOSIS — M549 Dorsalgia, unspecified: Secondary | ICD-10-CM | POA: Diagnosis present

## 2019-05-10 DIAGNOSIS — G8929 Other chronic pain: Secondary | ICD-10-CM | POA: Diagnosis not present

## 2019-05-10 DIAGNOSIS — M5440 Lumbago with sciatica, unspecified side: Secondary | ICD-10-CM | POA: Insufficient documentation

## 2019-05-10 MED ORDER — LIDOCAINE 5 % EX PTCH
1.0000 | MEDICATED_PATCH | CUTANEOUS | Status: DC
  Administered 2019-05-10: 1 via TRANSDERMAL
  Filled 2019-05-10: qty 1

## 2019-05-10 MED ORDER — LIDOCAINE 5 % EX PTCH: 1.0000 | MEDICATED_PATCH | Freq: Two times a day (BID) | CUTANEOUS | 0 refills | Status: DC

## 2019-05-10 MED ORDER — ORPHENADRINE CITRATE 30 MG/ML IJ SOLN
60.0000 mg | Freq: Two times a day (BID) | INTRAMUSCULAR | Status: DC
  Administered 2019-05-10: 60 mg via INTRAMUSCULAR
  Filled 2019-05-10: qty 2

## 2019-05-10 MED ORDER — ORPHENADRINE CITRATE ER 100 MG PO TB12: 100.0000 mg | ORAL_TABLET | Freq: Two times a day (BID) | ORAL | 0 refills | Status: DC

## 2019-05-10 MED ORDER — KETOROLAC TROMETHAMINE 10 MG PO TABS: 10.0000 mg | ORAL_TABLET | Freq: Four times a day (QID) | ORAL | 0 refills | Status: DC | PRN

## 2019-05-10 MED ORDER — KETOROLAC TROMETHAMINE 60 MG/2ML IM SOLN
60.0000 mg | Freq: Once | INTRAMUSCULAR | Status: AC
  Administered 2019-05-10: 07:00:00 60 mg via INTRAMUSCULAR
  Filled 2019-05-10: qty 2

## 2019-05-10 NOTE — Discharge Instructions (Addendum)
Advised to follow-up with physical therapy as directed.  Take medication as directed.

## 2019-05-10 NOTE — ED Triage Notes (Signed)
Patient ambulatory to triage with steady gait, without difficulty or distress noted, mask in place; pt reports lower back pain radiating into hips x month; st fell 2wks ago and having increasing pain since

## 2019-05-10 NOTE — ED Notes (Addendum)
See triage note  Presents with lower back pain  States pain started about 1 month ago w/o injury  Then states she had a fall 2 weeks ago  Pain then became worse  Pain is moving into both hips but mainly the left  Ambulates with slight limp d/t pain

## 2019-05-10 NOTE — ED Provider Notes (Signed)
[facility] Emergency Department Provider Note   ____________________________________________   First MD Initiated Contact with Patient 05/10/19 [phone]     (approximate)  I have reviewed the triage vital signs and the nursing notes.   HISTORY  Chief Complaint Back Pain    HPI Madison Mack is a 23 y.o. female patient presents with chronic back pain.  Patient stated radicular component intermittently to the bilateral lower extremities.  Patient denies bladder/ bowel dysfunction.  Patient seen this facility last month and is sensitive follow-up with her PCP and orthopedics.  Patient was advised to start physical therapy but states due to lack of funds she has not started the therapy.  Patient stated she will be paid this week and will start scheduled physical therapy.  Patient states she has tried muscle relaxers, anti-inflammatory medication, steroids and narcotics with only mild relief.  Patient rates the pain as a 7/10.  Patient described the pain as "sharp/spasmatic".         Past Medical History:  Diagnosis Date   Allergy    Anxiety    HA (headache)     Patient Active Problem List   Diagnosis Date Noted   MDD (major depressive disorder), recurrent episode, mild (HCC) 09/08/2018   Cyclic vomiting syndrome 09/08/2018   ADD (attention deficit disorder) 09/08/2018    Past Surgical History:  Procedure Laterality Date   ANKLE FRACTURE SURGERY Right 04/08    Prior to Admission medications   Medication Sig Start Date End Date Taking? Authorizing Provider  acetaminophen (TYLENOL) 500 MG tablet Take 500 mg by mouth every 6 (six) hours as needed.    [provider]  dimenhyDRINATE (DRAMAMINE) 50 MG tablet Take 50 mg by mouth every 8 (eight) hours as needed.    [provider]  diphenhydrAMINE (BENADRYL) 25 MG tablet Take 25 mg by mouth every 6 (six) hours as needed.    [provider]  ibuprofen (ADVIL) 400 MG  tablet Take 400 mg by mouth every 6 (six) hours as needed.    [provider]  ketorolac (TORADOL) 10 MG tablet Take 1 tablet (10 mg total) by mouth every 6 (six) hours as needed. 05/10/19   Joni Reining, PA-C  lidocaine (LIDODERM) 5 % Place 1 patch onto the skin every 12 (twelve) hours. Remove & Discard patch within 12 hours or as directed by MD 05/10/19 05/09/20  Joni Reining, PA-C  orphenadrine (NORFLEX) 100 MG tablet Take 1 tablet (100 mg total) by mouth 2 (two) times daily. 05/10/19   Joni Reining, PA-C  pantoprazole (PROTONIX) 20 MG tablet Take 1 tablet (20 mg total) by mouth 2 (two) times daily. 02/15/19 03/17/19  Pasty Spillers, MD    Allergies Penicillins, Amoxicillin, and Doxycycline  Family History  Problem Relation Age of Onset   Hyperlipidemia Father    Hypertension Mother    ADD / ADHD Maternal Aunt    Alcohol abuse Maternal Aunt    Drug abuse Maternal Aunt    Anxiety disorder Maternal Aunt    Alcohol abuse Maternal Uncle    Drug abuse Maternal Uncle    Anxiety disorder Paternal Grandfather    Depression Paternal Grandfather    Dementia Paternal Grandmother     Social History Social History   Tobacco Use   Smoking status: Never Smoker   Smokeless tobacco: Never Used  Substance Use Topics   Alcohol use: No    Alcohol/week: 0.0 standard drinks   Drug use:  No    Review of Systems Constitutional: No fever/chills Eyes: No visual changes. ENT: No sore throat. Cardiovascular: Denies chest pain. Respiratory: Denies shortness of breath. Gastrointestinal: No abdominal pain.  No nausea, no vomiting.  No diarrhea.  No constipation. Genitourinary: Negative for dysuria. Musculoskeletal: Positive for back pain. Skin: Negative for rash. Neurological: Negative for headaches, focal weakness or numbness. Psychiatric: ADD, anxiety, and MDD.  Allergic/Immunilogical: Penicillin and doxycycline.   ____________________________________________   PHYSICAL EXAM:  VITAL SIGNS: ED Triage Vitals  Enc Vitals Group     BP 05/10/19 0648 134/81     Pulse Rate 05/10/19 0648 77     Resp 05/10/19 0648 16     Temp 05/10/19 0648 98.2 F (36.8 C)     Temp Source 05/10/19 0648 Oral     SpO2 05/10/19 0648 98 %     Weight 05/10/19 0649 250 lb (113.4 kg)     Height 05/10/19 0649 5\' 9"  (1.753 m)     Head Circumference --      Peak Flow --      Pain Score 05/10/19 0649 7     Pain Loc --      Pain Edu? --      Excl. in GC? --     Constitutional: Alert and oriented.  Obesity.  Moderate distress.   Neck: No cervical spine tenderness to palpation. Hematological/Lymphatic/Immunilogical: No cervical lymphadenopathy. Cardiovascular: Normal rate, regular rhythm. Grossly normal heart sounds.  Good peripheral circulation. Respiratory: Normal respiratory effort.  No retractions. Lungs CTAB. Gastrointestinal: Soft and nontender. No distention. No abdominal bruits. No CVA tenderness. Genitourinary: Deferred Musculoskeletal: No spinal deformity.  Patient has moderate guarding bilateral paraspinal muscle areas near L3-S1.  Patient has full equal range of motion.  Patient presents with a positive right straight leg test no joint effusions. Neurologic:  Normal speech and language. No gross focal neurologic deficits are appreciated. No gait instability. Skin:  Skin is warm, dry and intact. No rash noted.  No ecchymosis or abrasions. Psychiatric: Mood and affect are normal. Speech and behavior are normal.  ____________________________________________   LABS (all labs ordered are listed, but only abnormal results are displayed)  Labs Reviewed - No data to display ____________________________________________  EKG   ____________________________________________  RADIOLOGY  ED MD interpretation:    Official radiology report(s): No results  found.  ____________________________________________   PROCEDURES  Procedure(s) performed (including Critical Care):  Procedures   ____________________________________________   INITIAL IMPRESSION / ASSESSMENT AND PLAN / ED COURSE  As part of my medical decision making, I reviewed the following data within the electronic MEDICAL RECORD NUMBER         Florence Cannershley Taylor Sider was evaluated in Emergency Department on 05/10/2019 for the symptoms described in the history of present illness. She was evaluated in the context of the global COVID-19 pandemic, which necessitated consideration that the patient might be at risk for infection with the SARS-CoV-2 virus that causes COVID-19. Institutional protocols and algorithms that pertain to the evaluation of patients at risk for COVID-19 are in a state of rapid change based on information released by regulatory bodies including the CDC and federal and state organizations. These policies and algorithms were followed during the patient's care in the ED.  Patient presents with chronic back pain.  No acute findings on physical exam.  Advised patient to follow-up with physical therapy as directed.  Patient given Norflex, Lidoderm patch, and Toradol prior to departure.  Patient given discharge care instructions.  ____________________________________________   FINAL CLINICAL IMPRESSION(S) / ED DIAGNOSES  Final diagnoses:  Chronic bilateral low back pain with sciatica, sciatica laterality unspecified     ED Discharge Orders         Ordered    orphenadrine (NORFLEX) 100 MG tablet  2 times daily     05/10/19 0719    ketorolac (TORADOL) 10 MG tablet  Every 6 hours PRN     05/10/19 0719    lidocaine (LIDODERM) 5 %  Every 12 hours     05/10/19 0719           Note:  This document was prepared using Dragon voice recognition software and may include unintentional dictation errors.    Sable Feil, PA-C 05/10/19 7680    Harvest Dark, MD 05/11/19 [phone]

## 2019-05-18 ENCOUNTER — Ambulatory Visit: Payer: BC Managed Care – PPO | Admitting: Gastroenterology

## 2019-05-23 ENCOUNTER — Ambulatory Visit: Payer: BC Managed Care – PPO | Attending: Sports Medicine | Admitting: Physical Therapy

## 2019-05-23 ENCOUNTER — Other Ambulatory Visit: Payer: Self-pay

## 2019-05-23 DIAGNOSIS — M533 Sacrococcygeal disorders, not elsewhere classified: Secondary | ICD-10-CM | POA: Diagnosis present

## 2019-05-23 DIAGNOSIS — M6281 Muscle weakness (generalized): Secondary | ICD-10-CM | POA: Insufficient documentation

## 2019-05-23 DIAGNOSIS — R2689 Other abnormalities of gait and mobility: Secondary | ICD-10-CM | POA: Diagnosis present

## 2019-05-23 NOTE — Patient Instructions (Addendum)
Minisquat: Scoot buttocks back slight, hinge like you are looking at your reflection on a pond  Knees behind toes,  Inhale to "smell flowers"  Exhale on the rise "like rocket"  Do not lock knees, have more weight across ballmounds of feet, toes relaxed   10 reps x 3 x day   ___   Clam Shell 45 Degrees   Lying with hips and knees bent 45, one pillow between knees and ankles. Lift knee with exhale. Be sure pelvis does not roll backward. Do not arch back. Do 20 times, each leg, 2 times per day.  ___    Standing:  10 reps on both sides x 3 x day     3 point tap   Feet are hip width Tap forward, center under hip not feet next to each other  Tap middle\, center  Tap back         ___   Standing posture  Shift navel forward slightly, shift weight across heel and ball mound, unlock knees  Neutral spine and pelvic alignment (like zipping a zipper)     ____

## 2019-05-23 NOTE — Therapy (Signed)
[facility] [address] [place], [place], [address] Phone: [phone]   Fax:  [phone]  Physical Therapy Evaluation  Patient Details  Name: Madison Mack MRN: [id number] Date of Birth: [birth year] Referring Provider (PT): Landry MellowKubinski    Encounter Date: 05/23/2019  PT End of Session - 05/23/19 1447    Visit Number  1    Number of Visits  10    Date for PT Re-Evaluation  08/01/19    PT Start Time  1403    PT Stop Time  1500    PT Time Calculation (min)  57 min       Past Medical History:  Diagnosis Date  . Allergy   . Anxiety   . HA (headache)     Past Surgical History:  Procedure Laterality Date  . ANKLE FRACTURE SURGERY Right 04/08    There were no vitals filed for this visit.   Subjective Assessment - 05/23/19 1409    Subjective Pt had LBP slowly start to hurt after she received epidural 2x for her L& D of her first and only son who is now 392.24 years old. After the epidural shots, pt experienced R sciatica. Pt would use heat and ICE and iburpofen and it would go away. Then it come back every few months. Six weeks ago, the pain started like it normally would and it progressively got worse. This time it was wrapped around her sides and her hips bothered her alot with sitting, standing. Standing for 10-15 sec at a time caused shooting pain down both legs. Her husband had to help her alot like she was elderly. The pain got to be so bad that led her to go to the ER for 2-3 x across the past weeks.  No MRI was ever done. X rays did not show anything out of the ordinary. Dr. Landry MellowKubinski told her that it was an SI joint issue. Pt can not sit criss cross apple sauce at all. Standing causes shooting pain on the sides of leg and stops at her knees. Sitting causes a wrapping around pain around the hips. SUI.      Pertinent History episiotomy with L& D of son, recent fall 3 weeks ago onto tailbone on 6 steps which caused more  LBP. Pt uses lidocaine that relieves from 5-6/10 pain to 3/10 and allows her to move around.  Pt had had ankle fixation in R when she was 23 years old. Hx of IBS - diarrhea type.         [facility] - 05/23/19 1405      Assessment   Medical Diagnosis  LBP without sciatica    Referring Provider (PT)  Kubinski       Precautions   Precautions  None      Restrictions   Weight Bearing Restrictions  No      Balance Screen   Has the patient fallen in the past 6 months  Yes    How many times?  1   fell down back steps 6 steps    Has the patient had a decrease in activity level because of a fear of falling?   No    Is the patient reluctant to leave their home because of a fear of falling?   No      Single Leg Stance   Comments  R SLS with more difficulty       Posture/Postural Control   Posture Comments  hyperextension of knees       Strength   Overall Strength Comments  hip flex B 4+/5, L for knee ext/flex, DF/PF OKC ,  R knee flex/ ext/ ankle DF/PF OKC 3+/5. R hip abd unable to ext hip due to pain . L hip abd 4+/5          Palpation   Spinal mobility  L/R sideflexion with pain, L rotation pain . ROM WFL.  ( post Tx: no pain)       SI assessment   standing R iliac crest higher ( post Tx: levelled)     Palpation comment  supine: R ASIS more anterior.  R hip ext ~ -5 deg ( post Tx: more aligned ASIS, hip ext ~ 15 deg )                 Objective measurements completed on examination: See above findings.      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    Time  6    Status  New    Target Date  07/04/19      PT LONG TERM GOAL #5   Title  Pt will able to sleep uninterrupted by pain across 2 nights in order to srestore quality of sleep    Time  5    Period  Weeks    Status  New    Target Date  06/27/19      Additional Long Term Goals   Additional Long Term Goals  Yes  PT LONG TERM GOAL #6   Title  Pt will demo no pelvic obliquities across 2 weeks and demo proepr bending mechanics in order to progress to deep core strengthenign exercises    Time  2    Period  Weeks    Status  New    Target Date  06/06/19             Plan - 05/23/19 1447    Clinical Impression Statement  Pt is a 23 yo female who complains of LBP and pelvic girdle pain that started after receiving 2 epidurals with her first and only child 2.5 years ago. These Sx impact her ability to stand and to sit on the floor with her son and at work as a Runner, broadcasting/film/video. Clinical assessments include pelvic obliquities ( R anterior rotation of iliac crest) , limited sacral nutation, limited and painful hip extension in sidelying, R LE weakness, R iliac crest higher than L,  and poor body mechanics with squatting/ bending. Following Tx today, pt demo'd levelled pelvic girdle, no pain with increased hip extension, increased sacral nutation and proper technique for squatting and bending. Plan to progress to deep core strengthening at next session. Pt benefits from skilled Pelvic Health PT.      Examination-Activity Limitations  Sit;Stand;Lift;Bend    Stability/Clinical Decision Making  Evolving/Moderate complexity    Clinical Decision Making  Low    Rehab Potential  Good    PT Frequency  1x / week    PT Duration  --   10   PT Treatment/Interventions  Moist Heat;Neuromuscular re-education;Patient/family education;Therapeutic activities;Therapeutic exercise;Manual techniques;Taping;Gait training;Scar mobilization;Functional mobility training    Consulted and Agree with Plan of Care  Patient       Patient will benefit from skilled therapeutic intervention in order to improve the following deficits and impairments:  Improper body mechanics, Pain, Increased muscle spasms, Decreased scar mobility, Decreased mobility, Decreased coordination, Decreased endurance, Decreased range of motion, Decreased strength, Hypomobility, Hypermobility, Postural dysfunction, Difficulty walking, Decreased safety awareness, Decreased balance, Decreased skin integrity  Visit Diagnosis: Sacrococcygeal disorders, not elsewhere classified  Muscle weakness (generalized)  Other abnormalities of gait and mobility     Problem List Patient Active Problem List   Diagnosis Date Noted  . MDD (major depressive disorder), recurrent episode, mild (HCC) 09/08/2018  . Cyclic vomiting syndrome 09/08/2018  . ADD (attention deficit disorder) 09/08/2018    Mariane Masters ,PT, DPT, E-RYT  05/23/2019, 3:01 PM  [facility] MAIN [facility] SERVICES [address] [place], [place], [address] Phone: [phone]   Fax:  [phone]  Name: Rudie Rikard MRN:  [id number] Date of Birth: 07-Oct-1995

## 2019-06-02 ENCOUNTER — Ambulatory Visit: Payer: BC Managed Care – PPO

## 2019-06-02 ENCOUNTER — Other Ambulatory Visit: Payer: Self-pay

## 2019-06-02 DIAGNOSIS — M6281 Muscle weakness (generalized): Secondary | ICD-10-CM

## 2019-06-02 DIAGNOSIS — M533 Sacrococcygeal disorders, not elsewhere classified: Secondary | ICD-10-CM

## 2019-06-02 DIAGNOSIS — R2689 Other abnormalities of gait and mobility: Secondary | ICD-10-CM

## 2019-06-02 NOTE — Therapy (Signed)
[facility] [facility] MAIN [facility] SERVICES [address] [place], [place], [address] Phone: [phone]   Fax:  [phone]  Physical Therapy Treatment The patient has been informed of current processes in place at Outpatient Rehab to protect patients from Covid-19 exposure including social distancing, schedule modifications, and new cleaning procedures. After discussing their particular risk with a therapist based on the patient's personal risk factors, the patient has decided to proceed with in-person therapy.   Patient Details  Name: Madison Mack MRN: [id number] Date of Birth: [birth year] Referring Provider (PT): Landry Mellow    Encounter Date: 06/02/2019  PT End of Session - 06/02/19 1828    Visit Number  2    Number of Visits  10    Date for PT Re-Evaluation  08/01/19    PT Start Time  1730    PT Stop Time  1830    PT Time Calculation (min)  60 min    Activity Tolerance  Patient tolerated treatment well;No increased pain    Behavior During Therapy  WFL for tasks assessed/performed       Past Medical History:  Diagnosis Date  . Allergy   . Anxiety   . HA (headache)     Past Surgical History:  Procedure Laterality Date  . ANKLE FRACTURE SURGERY Right 04/08    There were no vitals filed for this visit.    Pelvic Floor Physical Therapy Treatment Note  SCREENING  Changes in medications, allergies, or medical history?: none    SUBJECTIVE  Patient reports: Tuesday it "caught" and she has been trying to do her stretches/exercises on "brain breaks. Can sit longer but cannot sit criss-cross right now. Is very stressed and there are big changes at work. Back is not hurting when she first wakes up and get dressed independently.  Precautions:  24 years postpartum  Pain update:  Location of pain: R SIJ Current pain:  4/10  Max pain:  6/10 Least pain:  0/10 Nature of pain:   *3/10 following treatment in sitting  Patient  Goals: Decrease pain and be able to play with her 24 year old.    OBJECTIVE  Changes in: Posture/Observations:  Anterior pelvic tilt.  Range of Motion/Flexibilty:  Decreased sacral mobility along R border.  Palpation: TTP to R QL, Piriformis, OI, and paraspinals and multifidus at ~ L2 and B at L5. B Psoas and Iliacus  Gait Analysis: walks with moderately stiff gait, rotation happens at thoracic junction with mobility decreased in upper back.  INTERVENTIONS THIS SESSION: Manual: Performed TP release to  R QL, Piriformis, OI, and paraspinals and multifidus at ~ L2 and B at L5 and Psoas and Iliacus and performed MFR around RLQ in cephalic/L direction to decrease spasm and pain and allow for improved balance of musculature for improved function and decreased symptoms. Therex: Educated on and practiced "deep core 1" and prone heel-squeezes to improve strength of muscles opposing tight musculature to allow reciprocal inhibition to improve balance of musculature surrounding the pelvis and improve overall posture for optimal musculature length-tension relationship and function.    Total time: 60 min.                               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    Time  6    Status  New    Target Date  07/04/19      PT LONG TERM GOAL #5   Title  Pt will able to sleep uninterrupted by pain across 2 nights in order to srestore quality of sleep    Time  5    Period  Weeks    Status  New    Target Date  06/27/19      Additional Long Term Goals   Additional Long Term Goals  Yes      PT LONG TERM GOAL #6   Title  Pt will demo no pelvic obliquities across 2 weeks and demo proepr bending mechanics in order to progress to deep core strengthenign exercises    Time  2    Period  Weeks    Status  New    Target Date  06/06/19            Plan - 06/02/19 1828    Clinical Impression Statement  Pt. Responded well to all interventions today, demonstrating decreased spasm and pain and slightly improved gait as well as understanding and correct performance of all education and exercises provided today. They will continue to benefit from skilled physical therapy to work toward remaining goals and maximize function as well as decrease likelihood of symptom increase or recurrence.     Examination-Activity Limitations  Sit;Stand;Lift;Bend    Stability/Clinical Decision Making  Evolving/Moderate complexity    Rehab Potential  Good    PT Frequency  1x / week    PT Duration  --   10   PT Treatment/Interventions  Moist Heat;Neuromuscular re-education;Patient/family education;Therapeutic activities;Therapeutic exercise;Manual techniques;Taping;Gait training;Scar mobilization;Functional mobility training    Consulted and Agree with Plan of Care  Patient       Patient will benefit from skilled therapeutic intervention in order to improve the following deficits and impairments:  Improper body mechanics, Pain, Increased muscle spasms, Decreased scar mobility, Decreased mobility, Decreased coordination, Decreased endurance, Decreased  range of motion, Decreased strength, Hypomobility, Hypermobility, Postural dysfunction, Difficulty walking, Decreased safety awareness, Decreased balance, Decreased skin integrity  Visit Diagnosis: Sacrococcygeal disorders, not elsewhere classified  Muscle weakness (generalized)  Other abnormalities of gait and mobility     Problem List Patient Active  Problem List   Diagnosis Date Noted  . MDD (major depressive disorder), recurrent episode, mild (HCC) 09/08/2018  . Cyclic vomiting syndrome 09/08/2018  . ADD (attention deficit disorder) 09/08/2018   Cleophus MoltKeeli T. Bladen Umar DPT, ATC Cleophus MoltKeeli T Cathe Bilger 06/02/2019, 6:35 PM  [facility] [address] [place], [place], [address] Phone: [phone]   Fax:  [phone]  Name: Madison Mack MRN: [id number] Date of Birth: 1995-11-27

## 2019-06-02 NOTE — Patient Instructions (Signed)
Pelvic Tilt With Pelvic Floor (Hook-Lying)        Lie with hips and knees bent. Squeeze pelvic floor and flatten low back while breathing out so that pelvis tilts. Repeat _10x3__ times. Do __1_ times a day.    Gently squeeze heels together and hold for 2 seconds and then relax. Repeat 10 x 3, one time per day.

## 2019-06-07 ENCOUNTER — Ambulatory Visit: Payer: BC Managed Care – PPO | Admitting: Physical Therapy

## 2019-06-07 ENCOUNTER — Other Ambulatory Visit: Payer: Self-pay

## 2019-06-07 DIAGNOSIS — M533 Sacrococcygeal disorders, not elsewhere classified: Secondary | ICD-10-CM | POA: Diagnosis not present

## 2019-06-07 DIAGNOSIS — R2689 Other abnormalities of gait and mobility: Secondary | ICD-10-CM

## 2019-06-07 DIAGNOSIS — M6281 Muscle weakness (generalized): Secondary | ICD-10-CM

## 2019-06-07 NOTE — Patient Instructions (Addendum)
Modifications to alternate with sitting on the floor  Criss cross apple sause  Add folded think blanket under hips to elevate hips, knees drop lower than hips, small pillow under knees to relax them on   Switch to both ankles to one side of hips    Sitting in lunge position on stool   ___  Deep core level 1 and 2   With a folded towel under hips to lengthen back

## 2019-06-07 NOTE — Therapy (Addendum)
[place] MAIN [facility] SERVICES [address] [place], [place], [address] Phone: [phone]   Fax:  [phone]  Physical Therapy Treatment  Patient Details  Name: Tatanisha Cuthbert MRN: [id number] Date of Birth: 1996/01/03 Referring Provider (PT): Candelaria Stagers    Encounter Date: 06/07/2019  PT End of Session - 06/07/19 1652    Visit Number  3    Number of Visits  10    Date for PT Re-Evaluation  08/01/19    PT Start Time  8338    PT Stop Time  1751    PT Time Calculation (min)  61 min    Activity Tolerance  Patient tolerated treatment well;No increased pain    Behavior During Therapy  WFL for tasks assessed/performed       Past Medical History:  Diagnosis Date  . Allergy   . Anxiety   . HA (headache)     Past Surgical History:  Procedure Laterality Date  . ANKLE FRACTURE SURGERY Right 04/08    There were no vitals filed for this visit.  Subjective Assessment - 06/07/19 1555    Subjective  Pt reports the back is 30% better and now she feels tightness in her groin on both sides. Pt notices when she sits criss cross in that area, the R one pops but both bothers her. Pt feels like it it is pulling her leg apart.    Pertinent History  episiotomy with son, recent fall 3 weeks ago onto tailbone on 6 steps which caused more LBP. Pt uses lidocaine that relieves from 5-6/10 pain to 3/10 and allows her to move around.  Pt had had ankle fixation in R when she was 23 years old.         [facility] PT Assessment - 06/07/19 1556      Single Leg Stance   Comments  R SLS with slight trendeleberg ( R foot has ORIF)       Other:   Other/ Comments  sitting crossed legged with hips elevated on blnkets,. report of groin pain after ~ 29min,  no blankets, immediately.         AROM   Overall AROM Comments  hip ext standing ~20 deg B       PROM   Overall PROM Comments  sidelying: R hip ext ~20 deg with R groin pain, ( post Tx: no groin pain)       Strength   Overall Strength Comments  hip flex/ knee flex/ext, DF/PF OKC 5/5 B.  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    Time  6    Status  On-going      PT LONG TERM GOAL #5   Title  Pt will able to sleep uninterrupted by pain across 2  nights in order to srestore quality of sleep    Time  5    Period  Weeks    Status  Achieved      Additional Long Term Goals   Additional Long Term Goals  Yes      PT LONG TERM GOAL #6   Title  Pt will demo no pelvic obliquities across 2 weeks and demo proepr bending mechanics in order to progress to deep core strengthenign exercises    Time  2    Period  Weeks    Status  Achieved      PT LONG TERM GOAL #7   Title  Pt will increase her PF strength with single UE on wall from MMT 4/5 on L 13rep, R 10 reps  to B > 20 reps in order to improve pelvic girdle stability and lower kinetic strength    Time  4    Period  Weeks    Status  New    Target Date  07/05/19            Plan - 06/07/19 1700    Clinical Impression Statement  Pt required manual Tx to decrease tightness in thoracic spine, realign R iliac crest, and promote nutation of sacrum. 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 Initiated deep core strengthening now that pelvic girdle is aligned and diaphragm excursion / deep core coordination is in place which will help pt improve postural stability and force closure to pelvic  girdle for more functional activities. Pt continues to benefit from skilled PT    Examination-Activity Limitations  Sit;Stand;Lift;Bend    Stability/Clinical Decision Making  Evolving/Moderate complexity    Rehab Potential  Good    PT Frequency  1x / week    PT Duration  --   10   PT Treatment/Interventions  Moist Heat;Neuromuscular re-education;Patient/family education;Therapeutic activities;Therapeutic exercise;Manual techniques;Taping;Gait training;Scar mobilization;Functional mobility training    Consulted and Agree with Plan of Care  Patient       Patient will  benefit from skilled therapeutic intervention in order to improve the following deficits and impairments:  Improper body mechanics, Pain, Increased muscle spasms, Decreased scar mobility, Decreased mobility, Decreased coordination, Decreased endurance, Decreased range of motion, Decreased strength, Hypomobility, Hypermobility, Postural dysfunction, Difficulty walking, Decreased safety awareness, Decreased balance, Decreased skin integrity  Visit Diagnosis: Sacrococcygeal disorders, not elsewhere classified  Muscle weakness (generalized)  Other abnormalities of gait and mobility     Problem List Patient Active Problem List   Diagnosis Date Noted  . MDD (major depressive disorder), recurrent episode, mild (HCC) 09/08/2018  . Cyclic vomiting syndrome 09/08/2018  . ADD (attention deficit disorder) 09/08/2018    Mariane MastersYeung,Shin Yiing ,PT, DPT, E-RYT  06/07/2019, 5:01 PM  [facility] [address] [place], [place], [address] Phone: [phone]   Fax:  [phone]  Name: Florence Cannershley Taylor Koegel MRN: [id number] Date of Birth: 1996/01/06

## 2019-06-08 ENCOUNTER — Ambulatory Visit: Payer: BC Managed Care – PPO | Admitting: Gastroenterology

## 2019-06-14 ENCOUNTER — Other Ambulatory Visit: Payer: Self-pay

## 2019-06-14 ENCOUNTER — Ambulatory Visit: Payer: BC Managed Care – PPO | Attending: Sports Medicine | Admitting: Physical Therapy

## 2019-06-14 DIAGNOSIS — M6281 Muscle weakness (generalized): Secondary | ICD-10-CM

## 2019-06-14 DIAGNOSIS — M533 Sacrococcygeal disorders, not elsewhere classified: Secondary | ICD-10-CM

## 2019-06-14 DIAGNOSIS — R2689 Other abnormalities of gait and mobility: Secondary | ICD-10-CM

## 2019-06-14 NOTE — Patient Instructions (Addendum)
Keep up:  clam shells prone heel press    Discontinue: 3 point tap at doorway   ____  Deep core level 1 and 2 ( 2 x day)   ____   Standing without locking knees

## 2019-06-15 NOTE — Therapy (Signed)
[place] [facility] MAIN [facility] SERVICES [address] [place], [place], [address] Phone: [phone]   Fax:  [phone]  Physical Therapy Treatment  Patient Details  Name: Madison Mack MRN: [id number] Date of Birth: 1996/06/01 Referring Provider (PT): Landry Mellow    Encounter Date: 06/14/2019  PT End of Session - 06/14/19 1642    Visit Number  4    Number of Visits  10    Date for PT Re-Evaluation  08/01/19    PT Start Time  1600    PT Stop Time  1700    PT Time Calculation (min)  60 min    Activity Tolerance  Patient tolerated treatment well;No increased pain    Behavior During Therapy  WFL for tasks assessed/performed       Past Medical History:  Diagnosis Date  . Allergy   . Anxiety   . HA (headache)     Past Surgical History:  Procedure Laterality Date  . ANKLE FRACTURE SURGERY Right 04/08    There were no vitals filed for this visit.  Subjective Assessment - 06/15/19 2200    Subjective  Pt reports her back feels 80% better. Sitting on the floor is better with teh changing positions but pt can only withstand being on the floor for 10 min before the pain comes at a much more tolerable , "it is more uncomfortable and I am not fidgety".         [facility] PT Assessment - 06/15/19 2154      Observation/Other Assessments   Observations  forward head       PROM   Overall PROM Comments  pain with hip ext on R ( no pain with hip ext post Tx)       Palpation   Spinal mobility  increased  upper trap/ levator/ scalenes, medial scap L > R     Palpation comment  SIJ hypomobility                    [facility] Adult PT Treatment/Exercise - 06/15/19 2153      Neuro Re-ed    Neuro Re-ed Details   deep core training (       Exercises   Exercises  --   see pt instructions     Modalities   Modalities  Moist Heat      Moist Heat Therapy   Number Minutes Moist Heat  10 Minutes    Moist Heat Location  --   neck/ shoulders  during new HEP, guided relaxation     Manual Therapy   Manual therapy comments  STM/ MWM at upper trap/ levator/ scalenes, medial scap L > R, SIJ superior glide                    PT Long Term Goals - 06/07/19 1600      PT LONG TERM GOAL #1   Title  Pt will be able to take bath without pain for > 30 min, 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    Time  6    Status  On-going      PT LONG TERM GOAL #5   Title  Pt will able to sleep uninterrupted by pain across 2 nights in order to srestore quality of sleep    Time  5    Period  Weeks    Status  Achieved      Additional Long Term Goals   Additional Long Term Goals  Yes      PT LONG TERM GOAL #6   Title  Pt will demo no pelvic obliquities across 2 weeks and demo proepr bending mechanics in order to progress to deep core strengthenign exercises    Time  2    Period  Weeks    Status  Achieved      PT LONG TERM GOAL #7   Title  Pt will increase her PF strength with single UE on wall from MMT 4/5 on L 13rep, R 10 reps  to B > 20 reps in order to improve pelvic girdle stability and lower kinetic strength    Time  4    Period  Weeks    Status  New    Target Date  07/05/19            Plan - 06/15/19 2204    Clinical Impression Statement  Pt demonstrates more SIJ / thoracic  mobility, reports 80% less pain. Focused on decreasing forward head posture with manual Tx to decrease neck/ shoulder mm tightness L>R. Pt tolerated without pain. Initiated deep core and hip abduction strengthening. Plan to progress to thoracolumbar strengthening next session. Pt continues to benefit from skilled PT.    Examination-Activity Limitations  Sit;Stand;Lift;Bend    Stability/Clinical Decision Making  Evolving/Moderate complexity    Rehab Potential  Good    PT Frequency  1x / week    PT Duration  --   10   PT Treatment/Interventions  Moist Heat;Neuromuscular re-education;Patient/family education;Therapeutic activities;Therapeutic exercise;Manual techniques;Taping;Gait training;Scar mobilization;Functional mobility training    Consulted and Agree with Plan of Care  Patient       Patient will benefit from skilled therapeutic intervention in order to improve the following deficits and impairments:  Improper body mechanics, Pain, Increased muscle spasms, Decreased scar mobility, Decreased mobility, Decreased coordination, Decreased endurance, Decreased range of motion, Decreased strength, Hypomobility, Hypermobility, Postural dysfunction, Difficulty walking, Decreased safety awareness, Decreased balance, Decreased skin integrity  Visit Diagnosis: Sacrococcygeal disorders, not elsewhere classified  Muscle weakness (generalized)  Other abnormalities of gait and mobility     Problem List Patient Active Problem List   Diagnosis Date Noted  . MDD (major depressive disorder), recurrent episode, mild ([place]) 09/08/2018  . Cyclic vomiting syndrome 09/08/2018  . ADD (attention deficit disorder) 09/08/2018    Jerl Mina ,PT, DPT, E-RYT  06/15/2019, 10:08 PM  [place] MAIN [facility] SERVICES  [address] [place], [place], [address] Phone: [phone]   Fax:  [phone]  Name: Madison Mack MRN: [id number] Date of Birth:  September 11, 1995

## 2019-06-21 ENCOUNTER — Other Ambulatory Visit: Payer: Self-pay

## 2019-06-21 ENCOUNTER — Ambulatory Visit: Payer: BC Managed Care – PPO | Admitting: Physical Therapy

## 2019-06-21 DIAGNOSIS — R2689 Other abnormalities of gait and mobility: Secondary | ICD-10-CM

## 2019-06-21 DIAGNOSIS — M533 Sacrococcygeal disorders, not elsewhere classified: Secondary | ICD-10-CM

## 2019-06-21 DIAGNOSIS — M6281 Muscle weakness (generalized): Secondary | ICD-10-CM

## 2019-06-21 NOTE — Patient Instructions (Addendum)
Every other day   1) R sidelying   Clam Shell 45 Degrees ONLY ON THE L    Lying with hips and knees bent 45, one pillow between knees and ankles. Lift knee with exhale. Be sure pelvis does not roll backward. Do not arch back. Do 20 times, each leg, 2 times per day.   Complimentary stretch: [employer] _ foot over _ thigh, opposite knee straight  3 breaths  * Keep pelvis levelled with tactile cue with hand under back of hips  * Slide the ankle of the supporting foot out to decrease the angle which can help level the pelvis     ___  2)     band under ballmounds  while laying on back w/ knees bent  "W" exercise  10 reps x 2 sets   Band is placed under feet, knees bent, feet are hip width apart Hold band with thumbs point out, keep upper arm and elbow touching the bed the whole time  - inhale and then exhale pull bands by bending elbows hands move in a "w"  (feel shoulder blades squeezing)   __ 3) Multifidis twist  Band is on doorknob: stand further away from door (facing perpendicular)   Twisting trunk without moving the hips and knees, more pressure on outer pinky toe to lift your arch  Hold band at the level of ribcage, elbows bent,shoulder blades roll back and down like squeezing a pencil under armpit    Exhale twist,.10-15 deg away from door without moving your hips/ knees. Continue to maintain equal weight through legs. Keep knee unlocked.  20 x x 2 rep   ___   Avoid straining pelvic floor, abdominal muscles , spine  Use log rolling technique instead of getting out of bed with your neck or the sit-up     Log rolling into and out of bed   Log rolling into and out of bed If getting out of bed on R side, Bent knees, scoot hips/ shoulder to L  Raise R arm completely overhead, rolling onto armpit  Then lower bent knees to bed to get into complete side lying position  Then drop legs off bed, and push up onto R elbow/forearm, and use L hand to push onto the  bed

## 2019-06-21 NOTE — Therapy (Addendum)
[place] [facility] MAIN [facility] SERVICES [address] [place], [place], [address] Phone: [phone]   Fax:  [phone]  Physical Therapy Treatment  Patient Details  Name: Allen Basista MRN: [id number] Date of Birth: September 17, 1995 Referring Provider (PT): Landry Mellow    Encounter Date: 06/21/2019  PT End of Session - 06/21/19 1546    Visit Number  5    Number of Visits  10    Date for PT Re-Evaluation  08/01/19    PT Start Time  1540    PT Stop Time  1620    PT Time Calculation (min)  40 min    Activity Tolerance  Patient tolerated treatment well;No increased pain    Behavior During Therapy  WFL for tasks assessed/performed       Past Medical History:  Diagnosis Date  . Allergy   . Anxiety   . HA (headache)     Past Surgical History:  Procedure Laterality Date  . ANKLE FRACTURE SURGERY Right 04/08    There were no vitals filed for this visit.  Subjective Assessment - 06/21/19 1543    Subjective  Pt reports she only felt soreness come on a few days after last session. It is still annoying but tolerable. 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    Pertinent History  episiotomy with son, recent fall 3 weeks ago onto tailbone on 6 steps which caused more LBP. Pt uses lidocaine that relieves from 5-6/10 pain to 3/10 and allows her to move around.  Pt had had ankle fixation in R when she was 23 years old.         [facility] PT Assessment - 06/21/19 1547      Single Leg Stance   Comments  L SLS with trendelenberg       PROM   Overall PROM Comments  no pain with hip ext.       Strength   Overall Strength Comments  hip abd L 3+/5, R 4+/5       Palpation   Spinal mobility  no mm upper trap/ levator/ scalenes, medial scap L > R     Palpation comment  PSIS levelled                    [facility] Adult PT Treatment/Exercise - 06/21/19 1610      Neuro Re-ed    Neuro  Re-ed Details   cued for proper alignment in new HEP for strengtehning thoracolumbar strengthening, multifidis , glut medius       Exercises   Exercises  --   see pt instructions                 PT Long Term Goals - 06/07/19 1600      PT LONG TERM GOAL #1   Title  Pt will be able to take bath without pain for > 30 min, 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    Time  6    Status  On-going      PT LONG TERM GOAL #5   Title  Pt will able to sleep uninterrupted by pain across 2 nights in order to srestore quality of sleep    Time  5    Period  Weeks    Status  Achieved      Additional Long Term Goals   Additional Long Term Goals  Yes      PT LONG TERM GOAL #6   Title  Pt will demo no pelvic obliquities across 2 weeks and demo proepr bending mechanics in order to progress to deep core strengthenign exercises    Time  2    Period  Weeks    Status  Achieved      PT LONG TERM GOAL #7   Title  Pt will increase her PF strength with single UE on wall from MMT 4/5 on L 13rep, R 10 reps  to B > 20 reps in order to improve pelvic girdle stability and lower kinetic strength    Time  4    Period  Weeks    Status  New    Target Date  07/05/19            Plan - 06/21/19 1611    Clinical Impression Statement  Pt demo'd increased SIJ   mobility, more pelvic girdle stability, and less neck shoulder/ spinal mm tightness. These improvements correlate with pt's report to sit on floor for 15 min without pain at home and at work last week. Progressed pt to strengtehning of thoracolumbar, multifidis , L glut medius mm.  Pt showed medial collape of plantar arches and required cues for proper co-activation of feet and pelvic floor in new exercises . Plan address lower kinetic chain to optimize long term outcomes. Discussed discontinuing/ minimizing wearing of flip flops and to wear shoes with better shoe support.  With pt's improvements, decreasing pt's sessions to every other week to allow for pt to focus on strengthening HEP. Pt continues to benefit from skilled PT.    Examination-Activity Limitations  Sit;Stand;Lift;Bend    Stability/Clinical Decision Making  Evolving/Moderate complexity    Rehab Potential  Good    PT Frequency  biweekly     PT Duration  --   10   PT Treatment/Interventions  Moist Heat;Neuromuscular re-education;Patient/family education;Therapeutic activities;Therapeutic exercise;Manual techniques;Taping;Gait training;Scar mobilization;Functional mobility training    Consulted and Agree with Plan of Care  Patient       Patient will benefit from skilled therapeutic intervention in order to improve the following deficits and impairments:  Improper body mechanics, Pain, Increased muscle spasms, Decreased scar mobility, Decreased mobility, Decreased coordination, Decreased endurance, Decreased range of motion, Decreased strength, Hypomobility, Hypermobility, Postural dysfunction, Difficulty walking, Decreased safety awareness, Decreased balance, Decreased skin integrity  Visit Diagnosis: Sacrococcygeal disorders, not elsewhere classified  Muscle weakness (generalized)  Other  abnormalities of gait and mobility     Problem List Patient Active Problem List   Diagnosis Date Noted  . MDD (major depressive disorder),  recurrent episode, mild ([facility]) 09/08/2018  . Cyclic vomiting syndrome 09/08/2018  . ADD (attention deficit disorder) 09/08/2018    Mariane MastersYeung,Shin Yiing ,PT, DPT, E-RYT  06/21/2019, 4:20 PM  [place] [facility] MAIN [facility] [address] [place], [place], [address] Phone: [phone]   Fax:  [phone]  Name: Florence Cannershley Taylor Dunlop MRN: [id number] Date of Birth: 07-Feb-1996

## 2019-06-27 ENCOUNTER — Encounter: Payer: BC Managed Care – PPO | Admitting: Physical Therapy

## 2019-07-12 ENCOUNTER — Other Ambulatory Visit: Payer: Self-pay

## 2019-07-12 ENCOUNTER — Ambulatory Visit: Payer: BC Managed Care – PPO | Attending: Sports Medicine | Admitting: Physical Therapy

## 2019-07-12 DIAGNOSIS — M533 Sacrococcygeal disorders, not elsewhere classified: Secondary | ICD-10-CM | POA: Diagnosis present

## 2019-07-12 DIAGNOSIS — R2689 Other abnormalities of gait and mobility: Secondary | ICD-10-CM

## 2019-07-12 DIAGNOSIS — M6281 Muscle weakness (generalized): Secondary | ICD-10-CM | POA: Diagnosis present

## 2019-07-13 NOTE — Therapy (Addendum)
[place] MAIN [facility] SERVICES [address] [place], [place], [address] Phone: [phone]   Fax:  [phone]  Physical Therapy Treatment / Discharge Summary   Patient Details  Name: Madison Mack MRN: [id number] Date of Birth: 1996/02/22 Referring Provider (PT): Candelaria Stagers    Encounter Date: 07/12/2019  PT End of Session - 07/12/19 1624    Visit Number  6    Number of Visits  10    Date for PT Re-Evaluation  08/01/19    PT Start Time  1600    PT Stop Time  1630    PT Time Calculation (min)  30 min    Activity Tolerance  Patient tolerated treatment well;No increased pain    Behavior During Therapy  WFL for tasks assessed/performed       Past Medical History:  Diagnosis Date  . Allergy   . Anxiety   . HA (headache)     Past Surgical History:  Procedure Laterality Date  . ANKLE FRACTURE SURGERY Right 04/08    There were no vitals filed for this visit.  Subjective Assessment - 07/12/19 1606    Subjective  Pt reports her back pain has not been an issue. She continues to do her exercises in the morning. 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 Pt gets up 3-4 x a night to urinate.    Pertinent History  episiotomy with son, recent fall 3 weeks ago onto tailbone on 6 steps which caused more LBP. Pt uses lidocaine that relieves from 5-6/10 pain to 3/10 and allows her to move around.  Pt had had ankle fixation in R when she was 23 years old.         [facility] PT Assessment - 07/13/19 1139      Observation/Other Assessments   Observations  no hyperextended knees, improved upright posture      Strength   Overall Strength Comments  without hand on wall, R 9 reps, L 11 reps.  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    Time  6    Status  Achieved      PT LONG TERM GOAL #5   Title  Pt will able to sleep uninterrupted by pain across 2 nights in order to srestore quality of sleep    Time  5    Period  Weeks    Status  Achieved      PT LONG TERM GOAL #6   Title  Pt will demo no pelvic obliquities across 2 weeks and demo proepr bending mechanics in order to progress to deep core strengthenign exercises    Time  2    Period  Weeks    Status  Achieved      PT LONG TERM GOAL #7   Title  Pt will increase her PF strength with single UE on wall from MMT 4/5 on L 13rep, R 10 reps  to B > 20 reps in order to improve pelvic girdle stability and lower kinetic strength    Time  4    Period  Weeks    Status  Partially Met            Plan - 07/13/19 1138    Clinical Impression Statement Pt has achieved 6/7 goals and partially met remaining goal. Pt's Pelvic Girdle Questionnaire score decreased from 50% to  3% which indicates significantly improved sacroiliac joint dysfunction.   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 Pt's remaining goal is related to decreased plantarflexion strength which pt is still working on with her HEP in order to minimize collapsed arches, genu valgus which can help minimize overactivity of pelvic floor.    Pt is ready for d/c at this time however, pt will benefit from a sleep study to screen for OSA for which she has risk factors : nocturia 3-4 x night, snoring, family Hx of OSA.   Screening for OSA will be important to address her nocturia issue if there are  non-musculoskeletal factors involved with her poor sleep quality and nocturia.  Research below shows that nocturia episodes decrease with treatment of OSA. Pt agreed. 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 DPT plans to communicate with her PCP re: this recommendation.   Research: Raheem O et al. Clinical predictors of nocturia in the sleep apnea population. Urology Annals. 2014. 6 (1): 31-35.).      Chang A et al. Management of Nocturia in  the Female. Current Urology Reports. 2015. 16.(3) :485.   Thank you for your referral!      Examination-Activity Limitations  Sit;Stand;Lift;Bend    Stability/Clinical Decision Making  Evolving/Moderate complexity     Rehab Potential  Good    PT Frequency  Biweekly    PT Duration  --   10   PT Treatment/Interventions  Moist Heat;Neuromuscular re-education;Patient/family education;Therapeutic activities;Therapeutic exercise;Manual techniques;Taping;Gait training;Scar mobilization;Functional mobility training    Consulted and Agree with Plan of Care  Patient       Patient will benefit from skilled therapeutic intervention in order to improve the following deficits and impairments:  Improper body mechanics, Pain, Increased muscle spasms, Decreased scar mobility, Decreased mobility, Decreased coordination, Decreased endurance, Decreased range of motion, Decreased strength, Hypomobility, Hypermobility, Postural dysfunction, Difficulty walking, Decreased safety awareness, Decreased balance, Decreased skin integrity  Visit Diagnosis: Sacrococcygeal disorders, not elsewhere classified  Muscle weakness (generalized)  Other abnormalities of gait and mobility     Problem List Patient Active Problem List   Diagnosis Date Noted  . MDD (major depressive disorder), recurrent episode, mild ([place]) 09/08/2018  . Cyclic vomiting syndrome 09/08/2018  . ADD (attention deficit disorder) 09/08/2018    Jerl Mina ,PT, DPT, E-RYT  07/13/2019, 11:42 AM  [place] MAIN [facility] SERVICES [address] [place], [place], [address] Phone: [phone]   Fax:  [phone]  Name: Madison Mack MRN: [id number] Date of Birth: [birth year]

## 2019-07-19 ENCOUNTER — Ambulatory Visit: Payer: BC Managed Care – PPO | Admitting: Physical Therapy

## 2019-07-26 ENCOUNTER — Telehealth: Payer: Self-pay | Admitting: Physical Therapy

## 2019-07-26 ENCOUNTER — Ambulatory Visit: Payer: BC Managed Care – PPO | Admitting: Physical Therapy

## 2019-07-26 NOTE — Telephone Encounter (Signed)
DPT phoned pt to see if she heard back from her PCP re: sleep study. Pt replied she has not. DPT faxed her note and request a second time and also left a voice message at PCP's office.

## 2019-08-03 ENCOUNTER — Encounter: Payer: Self-pay | Admitting: Gastroenterology

## 2019-08-03 ENCOUNTER — Other Ambulatory Visit: Payer: Self-pay

## 2019-08-03 ENCOUNTER — Ambulatory Visit (INDEPENDENT_AMBULATORY_CARE_PROVIDER_SITE_OTHER): Payer: BC Managed Care – PPO | Admitting: Gastroenterology

## 2019-08-03 VITALS — BP 135/81 | HR 91 | Temp 98.4°F | Wt 252.0 lb

## 2019-08-03 DIAGNOSIS — R112 Nausea with vomiting, unspecified: Secondary | ICD-10-CM | POA: Diagnosis not present

## 2019-08-03 NOTE — Progress Notes (Signed)
Madison Antigua, MD [address]  [place], [place] [address]  Main: [phone]  Fax: [phone]   Primary Care Physician: Olin Hauser, DO   Chief Complaint  Patient presents with  . Nausea    Patient states she has nausea and vomiting around 3:30 every morning     HPI: Madison Mack is a 23 y.o. female here for follow-up of abdominal pain nausea and vomiting.  Patient reports recurrence of nausea and vomiting, wakes up at night with it.  No hematemesis.  No dysphagia.  Past Medical History:  Diagnosis Date  . Allergy   . Anxiety   . HA (headache)      Current Outpatient Medications  Medication Sig Dispense Refill  . acetaminophen (TYLENOL) 500 MG tablet Take 500 mg by mouth every 6 (six) hours as needed.    . dimenhyDRINATE (DRAMAMINE) 50 MG tablet Take 50 mg by mouth every 8 (eight) hours as needed.    . diphenhydrAMINE (BENADRYL) 25 MG tablet Take 25 mg by mouth every 6 (six) hours as needed.    Marland Kitchen ibuprofen (ADVIL) 400 MG tablet Take 400 mg by mouth every 6 (six) hours as needed.     No current facility-administered medications for this visit.    Allergies as of 08/03/2019 - Review Complete 08/03/2019  Allergen Reaction Noted  . Penicillins Rash 02/22/2015  . Amoxicillin  09/08/2018  . Doxycycline Hives 08/25/2017    ROS:  General: Negative for anorexia, weight loss, fever, chills, fatigue, weakness. ENT: Negative for hoarseness, difficulty swallowing , nasal congestion. CV: Negative for chest pain, angina, palpitations, dyspnea on exertion, peripheral edema.  Respiratory: Negative for dyspnea at rest, dyspnea on exertion, cough, sputum, wheezing.  GI: See history of present illness. GU:  Negative for dysuria, hematuria, urinary incontinence, urinary frequency, nocturnal urination.  Endo: Negative for unusual weight change.    Physical Examination:   BP 135/81 (BP Location: Left Arm, Patient Position: Sitting, Cuff  Size: Normal)   Pulse 91   Temp 98.4 F (36.9 C) (Oral)   Wt 252 lb (114.3 kg)   BMI 37.21 kg/m   General: Well-nourished, well-developed in no acute distress.  Eyes: No icterus. Conjunctivae pink. Mouth: Oropharyngeal mucosa moist and pink , no lesions erythema or exudate. Neck: Supple, Trachea midline Abdomen: Bowel sounds are normal, nontender, nondistended, no hepatosplenomegaly or masses, no abdominal bruits or hernia , no rebound or guarding.   Extremities: No lower extremity edema. No clubbing or deformities. Neuro: Alert and oriented x 3.  Grossly intact. Skin: Warm and dry, no jaundice.   Psych: Alert and cooperative, normal mood and affect.   Labs: CMP     Component Value Date/Time   NA 133 (L) 08/02/2015 1707   NA 141 10/31/2012 1309   K 4.0 08/02/2015 1707   K 3.5 10/31/2012 1309   CL 105 08/02/2015 1707   CL 100 10/31/2012 1309   CO2 21 (L) 08/02/2015 1707   CO2 26 (H) 10/31/2012 1309   GLUCOSE 105 (H) 08/02/2015 1707   GLUCOSE 102 (H) 10/31/2012 1309   BUN 16 08/02/2015 1707   BUN 10 10/31/2012 1309   CREATININE 0.74 08/02/2015 1707   CREATININE 0.85 10/31/2012 1309   CALCIUM 9.1 08/02/2015 1707   CALCIUM 9.3 10/31/2012 1309   PROT 6.7 02/15/2019 1251   PROT 8.4 10/31/2012 1309   ALBUMIN 3.9 02/15/2019 1251   ALBUMIN 3.8 10/31/2012 1309   AST 14 02/15/2019 1251  AST 15 10/31/2012 1309   ALT 22 02/15/2019 1251   ALT 20 10/31/2012 1309   ALKPHOS 78 02/15/2019 1251   ALKPHOS 100 10/31/2012 1309   BILITOT 0.4 02/15/2019 1251   BILITOT 0.5 10/31/2012 1309   GFRNONAA >60 08/02/2015 1707   GFRAA >60 08/02/2015 1707   Lab Results  Component Value Date   WBC 8.0 02/15/2019   HGB 13.6 02/15/2019   HCT 42.0 02/15/2019   MCV 89 02/15/2019   PLT 208 02/15/2019    Imaging Studies: No results found.  Assessment and Plan:   Madison Mack is a 23 y.o. y/o female here for follow-up of nausea and vomiting  Symptoms had improved but now have  reoccurred and are affecting her daily life  Due to recurrence of symptoms, patient interested in further evaluation with upper endoscopy  I have discussed alternative options, risks & benefits,  which include, but are not limited to, bleeding, infection, perforation,respiratory complication & drug reaction.  The patient agrees with this plan & written consent will be obtained.       Dr Melodie Bouillon

## 2019-08-08 ENCOUNTER — Other Ambulatory Visit: Payer: Self-pay

## 2019-08-08 ENCOUNTER — Other Ambulatory Visit
Admission: RE | Admit: 2019-08-08 | Discharge: 2019-08-08 | Disposition: A | Discharge status: Home / Self Care | Payer: BC Managed Care – PPO | Source: Ambulatory Visit | Attending: Gastroenterology | Admitting: Gastroenterology

## 2019-08-08 DIAGNOSIS — Z20828 Contact with and (suspected) exposure to other viral communicable diseases: Secondary | ICD-10-CM | POA: Diagnosis not present

## 2019-08-08 DIAGNOSIS — Z01812 Encounter for preprocedural laboratory examination: Secondary | ICD-10-CM | POA: Insufficient documentation

## 2019-08-09 ENCOUNTER — Encounter: Payer: Self-pay | Admitting: Gastroenterology

## 2019-08-09 LAB — SARS CORONAVIRUS 2 (TAT 6-24 HRS): SARS Coronavirus 2: NEGATIVE

## 2019-08-10 ENCOUNTER — Encounter: Payer: Self-pay | Admitting: Gastroenterology

## 2019-08-10 ENCOUNTER — Encounter
Admission: RE | Disposition: A | Discharge status: Home / Self Care | Payer: Self-pay | Source: Home / Self Care | Attending: Gastroenterology

## 2019-08-10 ENCOUNTER — Ambulatory Visit
Admission: RE | Admit: 2019-08-10 | Discharge: 2019-08-10 | Disposition: A | Discharge status: Home / Self Care | Payer: BC Managed Care – PPO | Attending: Gastroenterology | Admitting: Gastroenterology

## 2019-08-10 ENCOUNTER — Other Ambulatory Visit: Payer: Self-pay

## 2019-08-10 ENCOUNTER — Ambulatory Visit: Payer: BC Managed Care – PPO | Admitting: Anesthesiology

## 2019-08-10 DIAGNOSIS — R112 Nausea with vomiting, unspecified: Secondary | ICD-10-CM | POA: Diagnosis not present

## 2019-08-10 DIAGNOSIS — K449 Diaphragmatic hernia without obstruction or gangrene: Secondary | ICD-10-CM | POA: Insufficient documentation

## 2019-08-10 DIAGNOSIS — K2270 Barrett's esophagus without dysplasia: Secondary | ICD-10-CM | POA: Diagnosis not present

## 2019-08-10 DIAGNOSIS — R1110 Vomiting, unspecified: Secondary | ICD-10-CM

## 2019-08-10 DIAGNOSIS — K3189 Other diseases of stomach and duodenum: Secondary | ICD-10-CM | POA: Insufficient documentation

## 2019-08-10 DIAGNOSIS — Z881 Allergy status to other antibiotic agents status: Secondary | ICD-10-CM | POA: Insufficient documentation

## 2019-08-10 DIAGNOSIS — Z880 Allergy status to penicillin: Secondary | ICD-10-CM | POA: Diagnosis not present

## 2019-08-10 HISTORY — PX: ESOPHAGOGASTRODUODENOSCOPY (EGD) WITH PROPOFOL: SHX5813

## 2019-08-10 HISTORY — DX: Nausea with vomiting, unspecified: R11.2

## 2019-08-10 LAB — POCT PREGNANCY, URINE: Preg Test, Ur: NEGATIVE

## 2019-08-10 SURGERY — ESOPHAGOGASTRODUODENOSCOPY (EGD) WITH PROPOFOL
Anesthesia: General

## 2019-08-10 MED ORDER — PROPOFOL 500 MG/50ML IV EMUL
INTRAVENOUS | Status: DC | PRN
  Administered 2019-08-10: 150 ug/kg/min via INTRAVENOUS

## 2019-08-10 MED ORDER — OMEPRAZOLE 20 MG PO CPDR: 20.0000 mg | DELAYED_RELEASE_CAPSULE | Freq: Every day | ORAL | 0 refills | Status: DC

## 2019-08-10 MED ORDER — LIDOCAINE HCL (CARDIAC) PF 100 MG/5ML IV SOSY
PREFILLED_SYRINGE | INTRAVENOUS | Status: DC | PRN
  Administered 2019-08-10: 60 mg via INTRAVENOUS

## 2019-08-10 MED ORDER — PROPOFOL 500 MG/50ML IV EMUL
INTRAVENOUS | Status: AC
  Filled 2019-08-10: qty 50

## 2019-08-10 MED ORDER — LIDOCAINE HCL (PF) 2 % IJ SOLN
INTRAMUSCULAR | Status: AC
  Filled 2019-08-10: qty 10

## 2019-08-10 MED ORDER — EPHEDRINE SULFATE 50 MG/ML IJ SOLN
INTRAMUSCULAR | Status: AC
  Filled 2019-08-10: qty 1

## 2019-08-10 MED ORDER — FENTANYL CITRATE (PF) 100 MCG/2ML IJ SOLN
INTRAMUSCULAR | Status: AC
  Filled 2019-08-10: qty 2

## 2019-08-10 MED ORDER — GLYCOPYRROLATE 0.2 MG/ML IJ SOLN
INTRAMUSCULAR | Status: DC | PRN
  Administered 2019-08-10: 22.68 ug via INTRAVENOUS

## 2019-08-10 MED ORDER — PROPOFOL 10 MG/ML IV BOLUS
INTRAVENOUS | Status: DC | PRN
  Administered 2019-08-10: 40 mg via INTRAVENOUS
  Administered 2019-08-10: 50 mg via INTRAVENOUS

## 2019-08-10 MED ORDER — MIDAZOLAM HCL 2 MG/2ML IJ SOLN
INTRAMUSCULAR | Status: AC
  Filled 2019-08-10: qty 2

## 2019-08-10 MED ORDER — EPHEDRINE SULFATE 50 MG/ML IJ SOLN
INTRAMUSCULAR | Status: DC | PRN
  Administered 2019-08-10: 10 mg via INTRAVENOUS

## 2019-08-10 MED ORDER — SODIUM CHLORIDE 0.9 % IV SOLN: INTRAVENOUS | Status: DC

## 2019-08-10 NOTE — Anesthesia Postprocedure Evaluation (Signed)
Anesthesia Post Note  Patient: [name]  Procedure(s) Performed: ESOPHAGOGASTRODUODENOSCOPY (EGD) WITH PROPOFOL (N/A )  Patient location during evaluation: Endoscopy Anesthesia Type: General Level of consciousness: awake and alert and oriented Pain management: pain level controlled Vital Signs Assessment: post-procedure vital signs reviewed and stable Respiratory status: spontaneous breathing, nonlabored ventilation and respiratory function stable Cardiovascular status: blood pressure returned to baseline and stable Postop Assessment: no signs of nausea or vomiting Anesthetic complications: no     Last Vitals:  Vitals:   08/10/19 0844 08/10/19 0854  BP: (!) 132/91 133/84  Pulse:    Resp:    Temp: (!) 36 C   SpO2:      Last Pain:  Vitals:   08/10/19 0854  TempSrc:   PainSc: 0-No pain                 Pamella Samons

## 2019-08-10 NOTE — Transfer of Care (Signed)
Immediate Anesthesia Transfer of Care Note  Patient: Madison Mack  Procedure(s) Performed: ESOPHAGOGASTRODUODENOSCOPY (EGD) WITH PROPOFOL (N/A )  Patient Location: PACU  Anesthesia Type:General  Level of Consciousness: awake, alert  and oriented  Airway & Oxygen Therapy: Patient Spontanous Breathing and Patient connected to nasal cannula oxygen  Post-op Assessment: Report given to RN and Post -op Vital signs reviewed and stable  Post vital signs: Reviewed and stable  Last Vitals:  Vitals Value Taken Time  BP 132/91 08/10/19 0844  Temp 36 C 08/10/19 0844  Pulse 95 08/10/19 0847  Resp 24 08/10/19 0847  SpO2 97 % 08/10/19 0847  Vitals shown include unvalidated device data.  Last Pain:  Vitals:   08/10/19 0844  TempSrc: Temporal  PainSc: 0-No pain         Complications: No apparent anesthesia complications

## 2019-08-10 NOTE — H&P (Signed)
Madison Bouillon, MD [address], [place], [place], [address] [address], [address], [place], [place], [address] Phone: [phone]  Fax: [phone]  Primary Care Physician:  Smitty Cords, DO   Pre-Procedure History & Physical: HPI:  Madison Mack is a 23 y.o. female is here for an EGD.   Past Medical History:  Diagnosis Date  . Allergy   . Anxiety   . HA (headache)   . Nausea & vomiting     Past Surgical History:  Procedure Laterality Date  . ANKLE FRACTURE SURGERY Right 04/08    Prior to Admission medications   Medication Sig Start Date End Date Taking? Authorizing Provider  acetaminophen (TYLENOL) 500 MG tablet Take 500 mg by mouth every 6 (six) hours as needed.    [provider]  dimenhyDRINATE (DRAMAMINE) 50 MG tablet Take 50 mg by mouth every 8 (eight) hours as needed.    [provider]  diphenhydrAMINE (BENADRYL) 25 MG tablet Take 25 mg by mouth every 6 (six) hours as needed.    [provider]  ibuprofen (ADVIL) 400 MG tablet Take 400 mg by mouth every 6 (six) hours as needed.    [provider]    Allergies as of 08/03/2019 - Review Complete 08/03/2019  Allergen Reaction Noted  . Penicillins Rash 02/22/2015  . Amoxicillin  09/08/2018  . Doxycycline Hives 08/25/2017    Family History  Problem Relation Age of Onset  . Hyperlipidemia Father   . Hypertension Mother   . ADD / ADHD Maternal Aunt   . Alcohol abuse Maternal Aunt   . Drug abuse Maternal Aunt   . Anxiety disorder Maternal Aunt   . Alcohol abuse Maternal Uncle   . Drug abuse Maternal Uncle   . Anxiety disorder Paternal Grandfather   . Depression Paternal Grandfather   . Dementia Paternal Grandmother     Social History   Socioeconomic History  . Marital status: Married    Spouse name: thomas  . Number of children: 1  . Years of education: Not on file  . Highest education level: Bachelor's degree (e.g., BA, AB, BS)    Occupational History  . 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 Smoking status: Never Smoker  . Smokeless tobacco: Never Used  Substance and Sexual Activity  . Alcohol use: No    Alcohol/week: 0.0 standard drinks  . Drug use: No  . Sexual activity: Yes    Birth control/protection: I.U.D.  Other Topics Concern  . Not on file  Social History Narrative  . Not on file   Social Determinants of Health   Financial Resource Strain:   . Difficulty of Paying Living Expenses: Not on file  Food Insecurity:   . Worried About Programme researcher, broadcasting/film/video in the Last Year: Not on file  . Ran Out of Food in the Last Year: Not on file  Transportation Needs:   . Lack of Transportation (Medical): Not on file  . Lack of Transportation (Non-Medical): Not on file  Physical Activity:   . Days of Exercise per Week: Not on file  . Minutes of Exercise per Session: Not on file  Stress:   . Feeling of Stress : Not on file  Social Connections:   . Frequency of Communication with Friends and Family: Not on file  . Frequency of Social Gatherings with Friends and Family: Not on file  . Attends Religious Services: Not on file  .  Active Member of Clubs or Organizations: Not on file  . Attends Archivist Meetings: Not on file  . Marital Status: Not on file  Intimate Partner Violence:   . Fear of Current or Ex-Partner: Not on file  . Emotionally Abused: Not on file  . Physically Abused: Not on file  . Sexually Abused: Not on file    Review of Systems: See HPI, otherwise negative ROS  Physical Exam: BP 128/84   Pulse 74   Temp (!) 97.1 F (36.2 C) (Temporal)   Resp 16   Ht 5\' 8"  (1.727 m)   Wt 113.4 kg   LMP 07/14/2019 (Exact Date) Comment: neg preg test on 08/10/2019  SpO2 97%   BMI 38.01 kg/m  General:   Alert,  pleasant and cooperative in NAD Head:  Normocephalic and atraumatic. Neck:  Supple; no masses or thyromegaly. Lungs:  Clear  throughout to auscultation, normal respiratory effort.    Heart:  +S1, +S2, Regular rate and rhythm, No edema. Abdomen:  Soft, nontender and nondistended. Normal bowel sounds, without guarding, and without rebound.   Neurologic:  Alert and  oriented x4;  grossly normal neurologically.  Impression/Plan: Madison Mack is here for an EGD for nausea/vomiting  Risks, benefits, limitations, and alternatives regarding the procedure have been reviewed with the patient.  Questions have been answered.  All parties agreeable.   Madison Manifold, MD  08/10/2019, 8:12 AM

## 2019-08-10 NOTE — Anesthesia Preprocedure Evaluation (Signed)
Anesthesia Evaluation  Patient identified by MRN, date of birth, ID band Patient awake    Reviewed: Allergy & Precautions, NPO status , Patient's Chart, lab work & pertinent test results  History of Anesthesia Complications Negative for: history of anesthetic complications  Airway Mallampati: I  TM Distance: >3 FB Neck ROM: Full    Dental no notable dental hx.    Pulmonary neg pulmonary ROS, neg sleep apnea, neg COPD,    breath sounds clear to auscultation- rhonchi (-) wheezing      Cardiovascular Exercise Tolerance: Good (-) hypertension(-) CAD, (-) Past MI, (-) Cardiac Stents and (-) CABG  Rhythm:Regular Rate:Normal - Systolic murmurs and - Diastolic murmurs    Neuro/Psych  Headaches, neg Seizures PSYCHIATRIC DISORDERS Anxiety Depression    GI/Hepatic negative GI ROS, Neg liver ROS,   Endo/Other  negative endocrine ROSneg diabetes  Renal/GU negative Renal ROS     Musculoskeletal negative musculoskeletal ROS (+)   Abdominal (+) + obese,   Peds  Hematology negative hematology ROS (+)   Anesthesia Other Findings Past Medical History: No date: Allergy No date: Anxiety No date: HA (headache)   Reproductive/Obstetrics                             Anesthesia Physical Anesthesia Plan  ASA: II  Anesthesia Plan: General   Post-op Pain Management:    Induction: Intravenous  PONV Risk Score and Plan: 2 and Propofol infusion  Airway Management Planned: Natural Airway  Additional Equipment:   Intra-op Plan:   Post-operative Plan:   Informed Consent: I have reviewed the patients History and Physical, chart, labs and discussed the procedure including the risks, benefits and alternatives for the proposed anesthesia with the patient or authorized representative who has indicated his/her understanding and acceptance.     Dental advisory given  Plan Discussed with: CRNA and  Anesthesiologist  Anesthesia Plan Comments:         Anesthesia Quick Evaluation

## 2019-08-10 NOTE — Op Note (Signed)
[facility] Gastroenterology Patient Name: Madison Mack Procedure Date: 08/10/2019 7:34 AM MRN: [id number] Account #: [id number] Date of Birth: 1996/04/12 Admit Type: Outpatient Age: 23 Room: [facility] ENDO ROOM 2 Gender: Female Note Status: Finalized Procedure:             Upper GI endoscopy Indications:           Nausea with vomiting Providers:             Jacquelyne Quarry B. Bonna Gains MD, MD Referring MD:          Olin Hauser (Referring MD) Medicines:             Monitored Anesthesia Care Complications:         No immediate complications. Procedure:             Pre-Anesthesia Assessment:                        - Prior to the procedure, a History and Physical was                         performed, and patient medications, allergies and                         sensitivities were reviewed. The patient's tolerance                         of previous anesthesia was reviewed.                        - The risks and benefits of the procedure and the                         sedation options and risks were discussed with the                         patient. All questions were answered and informed                         consent was obtained.                        - Patient identification and proposed procedure were                         verified prior to the procedure by the physician, the                         nurse, the anesthesiologist, the anesthetist and the                         technician. The procedure was verified in the                         procedure room.                        - ASA Grade Assessment: II - A patient with mild                         systemic disease.  After obtaining informed consent, the endoscope was                         passed under direct vision. Throughout the procedure,                         the patient's blood pressure, pulse, and oxygen                         saturations were monitored  continuously. The Endoscope                         was introduced through the mouth, and advanced to the                         second part of duodenum. The upper GI endoscopy was                         accomplished with ease. The patient tolerated the                         procedure well. Findings:      Two tongues of salmon-colored mucosa were present. The maximum       longitudinal extent of these esophageal mucosal changes was 1 cm in       length. Mucosa was biopsied with a cold forceps for histology in a       targeted manner and in 4 quadrants.      The exam of the esophagus was otherwise normal.      Patchy mildly erythematous mucosa without bleeding was found in the       gastric antrum. Biopsies were taken with a cold forceps for histology.       Biopsies were obtained in the gastric body, at the incisura and in the       gastric antrum with cold forceps for histology.      A small hiatal hernia was present.      The duodenal bulb was normal.      Patchy mild mucosal changes characterized by flattening were found in       the second portion of the duodenum. Biopsies were taken with a cold       forceps for histology. Impression:            - Salmon-colored mucosa classified as Barrett's stage                         C0-M1 per Prague criteria. Biopsied.                        - Erythematous mucosa in the antrum. Biopsied.                        - Small hiatal hernia.                        - Normal duodenal bulb.                        - Mucosal changes in the duodenum. Biopsied.                        -  Biopsies were obtained in the gastric body, at the                         incisura and in the gastric antrum. Recommendation:        - Await pathology results.                        - Discharge patient to home (with escort).                        - Advance diet as tolerated.                        - Continue present medications.                        - Patient has a  contact number available for                         emergencies. The signs and symptoms of potential                         delayed complications were discussed with the patient.                         Return to normal activities tomorrow. Written                         discharge instructions were provided to the patient.                        - Discharge patient to home (with escort).                        - The findings and recommendations were discussed with                         the patient.                        - The findings and recommendations were discussed with                         the patient's family.                        - Take prescribed proton pump inhibitor or H2 blocker                         (antacid) medications 30 - 60 minutes before meals. Procedure Code(s):     --- Professional ---                        [phone], Esophagogastroduodenoscopy, flexible,                         transoral; with biopsy, single or multiple Diagnosis Code(s):     --- Professional ---                        K22.70, Barrett's esophagus  without dysplasia                        K31.89, Other diseases of stomach and duodenum                        R11.2, Nausea with vomiting, unspecified CPT copyright 2019 American Medical Association. All rights reserved. The codes documented in this report are preliminary and upon coder review may  be revised to meet current compliance requirements.  Melodie BouillonVarnita Razan Siler, MD Michel BickersVarnita B. Maximino Greenlandahiliani MD, MD 08/10/2019 8:49:51 AM This report has been signed electronically. Number of Addenda: 0 Note Initiated On: 08/10/2019 7:34 AM Estimated Blood Loss:  Estimated blood loss: none.      [facility]

## 2019-08-10 NOTE — Anesthesia Procedure Notes (Signed)
Date/Time: 08/10/2019 8:15 AM Performed by: Allean Found, CRNA Pre-anesthesia Checklist: Patient identified, Emergency Drugs available, Suction available, Patient being monitored and Timeout performed Patient Re-evaluated:Patient Re-evaluated prior to induction Oxygen Delivery Method: Nasal cannula Placement Confirmation: positive ETCO2

## 2019-08-10 NOTE — Anesthesia Post-op Follow-up Note (Signed)
Anesthesia QCDR form completed.        

## 2019-08-11 ENCOUNTER — Encounter: Payer: Self-pay | Admitting: *Deleted

## 2019-08-11 LAB — SURGICAL PATHOLOGY

## 2019-08-12 NOTE — L&D Delivery Note (Signed)
Delivery Note Primary OB: [facility] Delivery Physician: Annamarie Major, MD Gestational Age: Full term Antepartum complications: Hyperemesis, Depression, Obesity, Rh Neg Intrapartum complications: None  A viable Female was delivered via vertex presentation. "Madison Mack" Apgars:8 ,9  Weight:  pending .   Placenta status: spontaneous and Intact.  Cord: 3+ vessels;  with the following complications: none.  Anesthesia:  epidural Episiotomy:  none Lacerations:  periuretheral Suture Repair: 2.0 vicryl Est. Blood Loss (mL):  200 mL  Mom to postpartum.  Baby to Couplet care / Skin to Skin.  Annamarie Major, MD, [facility], [facility] 04/12/2020  10:13 PM [phone]

## 2019-08-15 ENCOUNTER — Telehealth: Payer: Self-pay

## 2019-08-15 ENCOUNTER — Encounter: Payer: Self-pay | Admitting: Gastroenterology

## 2019-08-15 NOTE — Telephone Encounter (Signed)
Patient verbalized understanding and states this makes her feel better

## 2019-08-15 NOTE — Telephone Encounter (Signed)
Patient had a EGD on 08/10/2019. Patient states she found out she was pregnant on Saturday. Patient wants to know if the procedure is going to affect the pregnancy.

## 2019-08-16 ENCOUNTER — Encounter: Payer: Self-pay | Admitting: Emergency Medicine

## 2019-08-16 ENCOUNTER — Other Ambulatory Visit: Payer: Self-pay

## 2019-08-16 ENCOUNTER — Emergency Department
Admission: EM | Admit: 2019-08-16 | Discharge: 2019-08-16 | Disposition: A | Discharge status: Home / Self Care | Payer: BC Managed Care – PPO | Attending: Emergency Medicine | Admitting: Emergency Medicine

## 2019-08-16 DIAGNOSIS — Z3A00 Weeks of gestation of pregnancy not specified: Secondary | ICD-10-CM | POA: Diagnosis not present

## 2019-08-16 DIAGNOSIS — Z3491 Encounter for supervision of normal pregnancy, unspecified, first trimester: Secondary | ICD-10-CM

## 2019-08-16 DIAGNOSIS — O2341 Unspecified infection of urinary tract in pregnancy, first trimester: Secondary | ICD-10-CM | POA: Diagnosis not present

## 2019-08-16 DIAGNOSIS — O219 Vomiting of pregnancy, unspecified: Secondary | ICD-10-CM | POA: Diagnosis present

## 2019-08-16 DIAGNOSIS — N390 Urinary tract infection, site not specified: Secondary | ICD-10-CM

## 2019-08-16 LAB — CBC
HCT: 41.5 % (ref 36.0–46.0)
Hemoglobin: 13.9 g/dL (ref 12.0–15.0)
MCH: 28.8 pg (ref 26.0–34.0)
MCHC: 33.5 g/dL (ref 30.0–36.0)
MCV: 86.1 fL (ref 80.0–100.0)
Platelets: 251 10*3/uL (ref 150–400)
RBC: 4.82 MIL/uL (ref 3.87–5.11)
RDW: 12.8 % (ref 11.5–15.5)
WBC: 8.9 10*3/uL (ref 4.0–10.5)
nRBC: 0 % (ref 0.0–0.2)

## 2019-08-16 LAB — COMPREHENSIVE METABOLIC PANEL
ALT: 13 U/L (ref 0–44)
AST: 14 U/L — ABNORMAL LOW (ref 15–41)
Albumin: 4 g/dL (ref 3.5–5.0)
Alkaline Phosphatase: 73 U/L (ref 38–126)
Anion gap: 9 (ref 5–15)
BUN: 8 mg/dL (ref 6–20)
CO2: 21 mmol/L — ABNORMAL LOW (ref 22–32)
Calcium: 9.2 mg/dL (ref 8.9–10.3)
Chloride: 107 mmol/L (ref 98–111)
Creatinine, Ser: 0.62 mg/dL (ref 0.44–1.00)
GFR calc Af Amer: >60 mL/min (ref >60)
GFR calc non Af Amer: >60 mL/min (ref >60)
Glucose, Bld: 104 mg/dL — ABNORMAL HIGH (ref 70–99)
Potassium: 3.8 mmol/L (ref 3.5–5.1)
Sodium: 137 mmol/L (ref 135–145)
Total Bilirubin: 1 mg/dL (ref 0.3–1.2)
Total Protein: 8 g/dL (ref 6.5–8.1)

## 2019-08-16 LAB — URINALYSIS, COMPLETE (UACMP) WITH MICROSCOPIC
Bilirubin Urine: NEGATIVE
Glucose, UA: NEGATIVE mg/dL
Hgb urine dipstick: NEGATIVE
Ketones, ur: 80 mg/dL — AB
Nitrite: NEGATIVE
Protein, ur: 100 mg/dL — AB
Specific Gravity, Urine: 1.029 (ref 1.005–1.030)
pH: 7 (ref 5.0–8.0)

## 2019-08-16 LAB — LIPASE, BLOOD: Lipase: 26 U/L (ref 11–51)

## 2019-08-16 LAB — POCT PREGNANCY, URINE: Preg Test, Ur: POSITIVE — AB

## 2019-08-16 MED ORDER — NITROFURANTOIN MONOHYD MACRO 100 MG PO CAPS: 100.0000 mg | ORAL_CAPSULE | Freq: Two times a day (BID) | ORAL | 0 refills | Status: AC

## 2019-08-16 MED ORDER — ONDANSETRON 4 MG PO TBDP
4.0000 mg | ORAL_TABLET | Freq: Once | ORAL | Status: AC
  Administered 2019-08-16: 4 mg via ORAL
  Filled 2019-08-16: qty 1

## 2019-08-16 MED ORDER — ONDANSETRON 4 MG PO TBDP: 4.0000 mg | ORAL_TABLET | Freq: Three times a day (TID) | ORAL | 0 refills | Status: DC | PRN

## 2019-08-16 NOTE — ED Provider Notes (Signed)
[facility], [place] Emergency Department Provider Note   ____________________________________________   First MD Initiated Contact with Patient 08/16/19 [phone]     (approximate)  I have reviewed the triage vital signs and the nursing notes.   HISTORY  Chief Complaint Emesis During Pregnancy   HPI Madison Mack is a 24 y.o. female presents to the ED with complaint of nausea and vomiting.  Patient states that she took 3 home pregnancy test and all 3 were positive.  Patient states that she had excessive nausea with her first pregnancy.  She denies any symptoms of Covid or exposure.  Patient states that she has vomited approximately 10 times and 24 hours.  She tries to continue drinking fluids.  She has an appointment with [facility] on 08/29/2019.      Past Medical History:  Diagnosis Date  . Allergy   . Anxiety   . HA (headache)   . Nausea & vomiting     Patient Active Problem List   Diagnosis Date Noted  . Intractable vomiting   . Barrett's esophagus without dysplasia   . MDD (major depressive disorder), recurrent episode, mild (HCC) 09/08/2018  . Cyclic vomiting syndrome 09/08/2018  . ADD (attention deficit disorder) 09/08/2018    Past Surgical History:  Procedure Laterality Date  . ANKLE FRACTURE SURGERY Right 04/08  . ESOPHAGOGASTRODUODENOSCOPY (EGD) WITH PROPOFOL N/A 08/10/2019   Procedure: ESOPHAGOGASTRODUODENOSCOPY (EGD) WITH PROPOFOL;  Surgeon: Pasty Spillers, MD;  Location: [facility] ENDOSCOPY;  Service: Endoscopy;  Laterality: N/A;    Prior to Admission medications   Medication Sig Start Date End Date Taking? Authorizing Provider  acetaminophen (TYLENOL) 500 MG tablet Take 500 mg by mouth every 6 (six) hours as needed.    [provider]  dimenhyDRINATE (DRAMAMINE) 50 MG tablet Take 50 mg by mouth every 8 (eight) hours as needed.    [provider]  diphenhydrAMINE (BENADRYL) 25 MG tablet Take 25 mg by mouth every  6 (six) hours as needed.    [provider]  nitrofurantoin, macrocrystal-monohydrate, (MACROBID) 100 MG capsule Take 1 capsule (100 mg total) by mouth 2 (two) times daily for 7 days. 08/16/19 08/23/19  Tommi Rumps, PA-C  ondansetron (ZOFRAN ODT) 4 MG disintegrating tablet Take 1 tablet (4 mg total) by mouth every 8 (eight) hours as needed for nausea or vomiting. 08/16/19   Tommi Rumps, PA-C    Allergies Penicillins, Amoxicillin, and Doxycycline  Family History  Problem Relation Age of Onset  . Hyperlipidemia Father   . Hypertension Mother   . ADD / ADHD Maternal Aunt   . Alcohol abuse Maternal Aunt   . Drug abuse Maternal Aunt   . Anxiety disorder Maternal Aunt   . Alcohol abuse Maternal Uncle   . Drug abuse Maternal Uncle   . Anxiety disorder Paternal Grandfather   . Depression Paternal Grandfather   . Dementia Paternal Grandmother     Social History Social History   Tobacco Use  . Smoking status: Never Smoker  . Smokeless tobacco: Never Used  Substance Use Topics  . Alcohol use: No    Alcohol/week: 0.0 standard drinks  . Drug use: No    Review of Systems Constitutional: No fever/chills Eyes: No visual changes. ENT: No sore throat. Cardiovascular: Denies chest pain. Respiratory: Denies shortness of breath. Gastrointestinal: No abdominal pain.  Positive nausea, positive vomiting.  No diarrhea.  No constipation. Genitourinary: Negative for dysuria. Musculoskeletal: Negative for back pain. Skin: Negative for rash. Neurological: Negative  for headaches, focal weakness or numbness. ___________________________________________   PHYSICAL EXAM:  VITAL SIGNS: ED Triage Vitals  Enc Vitals Group     BP 08/16/19 0806 128/80     Pulse Rate 08/16/19 0806 87     Resp 08/16/19 0806 18     Temp 08/16/19 0806 98.6 F (37 C)     Temp Source 08/16/19 0806 Oral     SpO2 08/16/19 0806 99 %     Weight 08/16/19 0750 250 lb (113.4 kg)     Height 08/16/19 0750 5'  8" (1.727 m)     Head Circumference --      Peak Flow --      Pain Score 08/16/19 0750 0     Pain Loc --      Pain Edu? --      Excl. in GC? --    Constitutional: Alert and oriented. Well appearing and in no acute distress. Eyes: Conjunctivae are normal.  Head: Atraumatic. Nose: No congestion/rhinnorhea. Mouth/Throat: Mucous membranes are moist.  Oropharynx non-erythematous. Neck: No stridor.   Cardiovascular: Normal rate, regular rhythm. Grossly normal heart sounds.  Good peripheral circulation. Respiratory: Normal respiratory effort.  No retractions. Lungs CTAB. Gastrointestinal: Soft and nontender. No distention.  No CVA tenderness. Musculoskeletal: Moves upper and lower extremities no difficulty.  Normal gait was noted. Neurologic:  Normal speech and language. No gross focal neurologic deficits are appreciated. No gait instability. Skin:  Skin is warm, dry and intact. No rash noted. Psychiatric: Mood and affect are normal. Speech and behavior are normal.  ____________________________________________   LABS (all labs ordered are listed, but only abnormal results are displayed)  Labs Reviewed  COMPREHENSIVE METABOLIC PANEL - Abnormal; Notable for the following components:      Result Value   CO2 21 (*)    Glucose, Bld 104 (*)    AST 14 (*)    All other components within normal limits  URINALYSIS, COMPLETE (UACMP) WITH MICROSCOPIC - Abnormal; Notable for the following components:   Color, Urine YELLOW (*)    APPearance HAZY (*)    Ketones, ur 80 (*)    Protein, ur 100 (*)    Leukocytes,Ua MODERATE (*)    Bacteria, UA RARE (*)    All other components within normal limits  POCT PREGNANCY, URINE - Abnormal; Notable for the following components:   Preg Test, Ur POSITIVE (*)    All other components within normal limits  URINE CULTURE  LIPASE, BLOOD  CBC    PROCEDURES  Procedure(s) performed (including Critical  Care):  Procedures   ____________________________________________   INITIAL IMPRESSION / ASSESSMENT AND PLAN / ED COURSE  As part of my medical decision making, I reviewed the following data within the electronic MEDICAL RECORD NUMBER Notes from prior ED visits and [place] Controlled Substance Database ----------------------------------------- 12:21 PM on 08/16/2019 ----------------------------------------- At the time of discharge patient had no continued vomiting and was eating ice chips and tolerating well.  Madison Mack was evaluated in Emergency Department on 08/16/2019 for the symptoms described in the history of present illness. She was evaluated in the context of the global COVID-19 pandemic, which necessitated consideration that the patient might be at risk for infection with the SARS-CoV-2 virus that causes COVID-19. Institutional protocols and algorithms that pertain to the evaluation of patients at risk for COVID-19 are in a state of rapid change based on information released by regulatory bodies including the CDC and federal and state organizations. These policies and algorithms were  followed during the patient's care in the ED.  24 year old female presents to the ED with complaint of nausea and vomiting.  Patient states that she did 3 home pregnancy test and all 3 were positive.  She states that she has had problems with vomiting in the past but not this early in a pregnancy.  Urinalysis showed an acute UTI and patient was made aware.  She was given Zofran while in the ED ED and was able to tolerate ice chips without any continued vomiting.  Patient was given a prescription for Macrobid 100 mg twice daily for 7 days and Zofran as needed for nausea.  She has an appointment with [facility] and is encouraged to keep her appointment.  She is also encouraged to return to the emergency department if any severe worsening of her  symptoms. ____________________________________________   FINAL CLINICAL IMPRESSION(S) / ED DIAGNOSES  Final diagnoses:  Acute urinary tract infection  First trimester pregnancy     ED Discharge Orders         Ordered    nitrofurantoin, macrocrystal-monohydrate, (MACROBID) 100 MG capsule  2 times daily     08/16/19 1215    ondansetron (ZOFRAN ODT) 4 MG disintegrating tablet  Every 8 hours PRN     08/16/19 1215           Note:  This document was prepared using Dragon voice recognition software and may include unintentional dictation errors.    Johnn Hai, PA-C 08/16/19 1414    Lavonia Drafts, MD 08/16/19 [phone]

## 2019-08-16 NOTE — ED Triage Notes (Signed)
Pt reports found out she was pregnant this weekend and has been nauseated and vomiting since then. Pt reports has excessive sickness with her first pregnancy but this is worse. Pt reports hx of issues with sickness and recently had an upper GI.

## 2019-08-16 NOTE — ED Notes (Signed)
Pt provided with ice chips at this time.

## 2019-08-16 NOTE — ED Notes (Addendum)
Pt denies nausea at this time.

## 2019-08-16 NOTE — ED Notes (Signed)
See triage note  States she recently found out she was pregnant   Then developed some n/v  States she has tried some Dramamine but unable to keep anything down

## 2019-08-16 NOTE — Discharge Instructions (Addendum)
Keep your appointment with your OB/GYN..  Increase fluids.  Continue Zofran as needed for nausea and vomiting.  Begin taking Macrobid 100 mg twice daily for 7 days.  This is to treat your urinary tract infection.  Also a culture was ordered in case your OB/GYN needs to reevaluate any urinary symptoms that you may continue to have. Return to the emergency department if any severe worsening of your symptoms or inability to keep the antibiotic down.

## 2019-08-17 LAB — URINE CULTURE: Special Requests: NORMAL

## 2019-08-18 ENCOUNTER — Ambulatory Visit (INDEPENDENT_AMBULATORY_CARE_PROVIDER_SITE_OTHER): Payer: BC Managed Care – PPO | Admitting: Certified Nurse Midwife

## 2019-08-18 ENCOUNTER — Encounter: Payer: Self-pay | Admitting: Certified Nurse Midwife

## 2019-08-18 ENCOUNTER — Other Ambulatory Visit: Payer: Self-pay

## 2019-08-18 VITALS — BP 120/80 | HR 89 | Ht 69.0 in | Wt 245.0 lb

## 2019-08-18 DIAGNOSIS — O99891 Other specified diseases and conditions complicating pregnancy: Secondary | ICD-10-CM

## 2019-08-18 DIAGNOSIS — O219 Vomiting of pregnancy, unspecified: Secondary | ICD-10-CM | POA: Diagnosis not present

## 2019-08-18 DIAGNOSIS — N926 Irregular menstruation, unspecified: Secondary | ICD-10-CM

## 2019-08-18 DIAGNOSIS — R8271 Bacteriuria: Secondary | ICD-10-CM

## 2019-08-18 LAB — POCT URINALYSIS DIPSTICK
Appearance: ABNORMAL
Bilirubin, UA: NEGATIVE
Blood, UA: NEGATIVE
Glucose, UA: NEGATIVE
Nitrite, UA: NEGATIVE
Odor: NORMAL
Protein, UA: POSITIVE — AB
Spec Grav, UA: >=1.03 — AB (ref 1.010–1.025)
Urobilinogen, UA: 0.2 E.U./dL
pH, UA: 5 (ref 5.0–8.0)

## 2019-08-18 MED ORDER — PROMETHAZINE HCL 25 MG PO TABS: ORAL_TABLET | ORAL | 2 refills | Status: DC

## 2019-08-18 MED ORDER — PROMETHAZINE HCL 25 MG RE SUPP: 25.0000 mg | Freq: Four times a day (QID) | RECTAL | 0 refills | Status: DC | PRN

## 2019-08-18 NOTE — Progress Notes (Addendum)
Obstetrics & Gynecology Office Visit   Chief Complaint:  Chief Complaint  Patient presents with  . Morning Sickness    Early OB hyperemesis (15-20 times a day) worse at night    History of Present Illness: 24 year old G2 P1001, LMP 07/16/2019 who was seen in the ER 2 days ago for nausea and vomiting of pregnancy. She presents for a follow up. She had her first positive pregnancy test on 2 January when she started to have the nausea and vomiting. She had a negative pregnancy test on 08/10/19 prior to having her EGD done for cyclical vomiting syndrome. Denies any bleeding or leakage of fluid. Was given a prescription for Zofran, but it has not helped with the vomiting. Yesterday she was able to keep down saltines and half a cheeseburger. She was also started on Macrobid for a possible UTI (asymptomatic), but the culture has returned with multiple species of bacteria and was probably contaminated. Her electrolytes on 1/5 were normal. She has lost 7 pounds since 12/23 (weighed 252#). She is taking omeprazole for some GERD.   She states that she had problems with nausea and vomiting throughout her entire pregnancy with her first pregnancy and that the only medication that was helpful was Diclegis.   Review of Systems:  ROS -see HPI, in addition, negative for vaginal bleeding, dysuria, fever or diarrhea. Positive for bloating and mild intermittent cramping.   Past Medical History:  Past Medical History:  Diagnosis Date  . Allergy   . Anxiety   . HA (headache)   . Nausea & vomiting     Past Surgical History:  Past Surgical History:  Procedure Laterality Date  . ANKLE FRACTURE SURGERY Right 04/08  . ESOPHAGOGASTRODUODENOSCOPY (EGD) WITH PROPOFOL N/A 08/10/2019   Procedure: ESOPHAGOGASTRODUODENOSCOPY (EGD) WITH PROPOFOL;  Surgeon: Virgel Manifold, MD;  Location: [facility] ENDOSCOPY;  Service: Endoscopy;  Laterality: N/A;    Gynecologic History: Patient's last menstrual period was  07/16/2019 (exact date).  Obstetric History: G2P1001 OB History  Gravida Para Term Preterm AB Living  2 1 1     1   SAB TAB Ectopic Multiple Live Births          1    # Outcome Date GA Lbr Len/2nd Weight Sex Delivery Anes PTL Lv  2 Current           1 Term 10/23/16 [redacted]w[redacted]d  7 lb 14 oz (3.572 kg) M Vag-Spont  N LIV   Family History:  Family History  Problem Relation Age of Onset  . Hyperlipidemia Father   . Hypertension Mother   . ADD / ADHD Maternal Aunt   . Alcohol abuse Maternal Aunt   . Drug abuse Maternal Aunt   . Anxiety disorder Maternal Aunt   . Alcohol abuse Maternal Uncle   . Drug abuse Maternal Uncle   . Anxiety disorder Paternal Grandfather   . Depression Paternal Grandfather   . Dementia Paternal Grandmother     Social History:  Social History   Socioeconomic History  . Marital status: Married    Spouse name: thomas  . Number of children: 1  . Years of education: Not on file  . Highest education level: Bachelor's degree (e.g., BA, AB, BS)  Occupational History  . 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 Smoking status: Never Smoker  . Smokeless tobacco: Never Used  Substance and Sexual Activity  . Alcohol use: No  Alcohol/week: 0.0 standard drinks  . Drug use: No  . Sexual activity: Yes    Birth control/protection: I.U.D.  Other Topics Concern  . Not on file  Social History Narrative  . Not on file   Social Determinants of Health   Financial Resource Strain:   . Difficulty of Paying Living Expenses: Not on file  Food Insecurity:   . Worried About Programme researcher, broadcasting/film/video in the Last Year: Not on file  . Ran Out of Food in the Last Year: Not on file  Transportation Needs:   . Lack of Transportation (Medical): Not on file  . Lack of Transportation (Non-Medical): Not on file  Physical Activity:   . Days of Exercise per Week: Not on file  . Minutes of Exercise per Session: Not on file  Stress:   .  Feeling of Stress : Not on file  Social Connections:   . Frequency of Communication with Friends and Family: Not on file  . Frequency of Social Gatherings with Friends and Family: Not on file  . Attends Religious Services: Not on file  . Active Member of Clubs or Organizations: Not on file  . Attends Banker Meetings: Not on file  . Marital Status: Not on file  Intimate Partner Violence:   . Fear of Current or Ex-Partner: Not on file  . Emotionally Abused: Not on file  . Physically Abused: Not on file  . Sexually Abused: Not on file    Allergies:  Allergies  Allergen Reactions  . Penicillins Rash    Has patient had a PCN reaction causing immediate rash, facial/tongue/throat swelling, SOB or lightheadedness with hypotension: SWELLING FEET AND HANDS, HIVES Has patient had a PCN reaction causing severe rash involving mucus membranes or skin necrosis: NO Has patient had a PCN reaction that required hospitalization: NO Has patient had a PCN reaction occurring within the last 10 years: NO If all of the above answers are "NO", then may proceed with Cephalosporin use.   Marland Kitchen Amoxicillin   . Doxycycline Hives    Medications: Prior to Admission medications   Medication Sig Start Date End Date Taking? Authorizing Provider  acetaminophen (TYLENOL) 500 MG tablet Take 500 mg by mouth every 6 (six) hours as needed.   Yes [provider]  dimenhyDRINATE (DRAMAMINE) 50 MG tablet Take 50 mg by mouth every 8 (eight) hours as needed.   Yes [provider]  diphenhydrAMINE (BENADRYL) 25 MG tablet Take 25 mg by mouth every 6 (six) hours as needed.   Yes [provider]  nitrofurantoin, macrocrystal-monohydrate, (MACROBID) 100 MG capsule Take 1 capsule (100 mg total) by mouth 2 (two) times daily for 7 days. 08/16/19 08/23/19 Yes Summers, Rhonda L, PA-C  omeprazole (PRILOSEC) 20 MG capsule Take 20 mg by mouth daily.   Yes [provider]  ondansetron (ZOFRAN  ODT) 4 MG disintegrating tablet Take 1 tablet (4 mg total) by mouth every 8 (eight) hours as needed for nausea or vomiting. 08/16/19  Yes Tommi Rumps, PA-C  promethazine (PHENERGAN) 25 MG suppository Place 1 suppository (25 mg total) rectally every 6 (six) hours as needed for nausea or vomiting. 08/18/19   Farrel Conners, CNM   Physical Exam Vitals: BP 120/80 (BP Location: Right Arm, Patient Position: Sitting, Cuff Size: Large)   Pulse 89   Ht 5\' 9"  (1.753 m)   Wt 245 lb (111.1 kg)   LMP 07/16/2019 (Exact Date)   BMI 36.18 kg/m    Physical Exam  Constitutional: She is oriented to person, place, and time. She appears well-developed and well-nourished. No distress.  HENT:  Mouth/Throat: Oropharynx is clear and moist.  Neck: No thyromegaly present.  Cardiovascular: Regular rhythm.  No murmur heard. AP 100  Respiratory: Effort normal and breath sounds normal.  GI: Soft. She exhibits no mass. There is no abdominal tenderness.  Neurological: She is oriented to person, place, and time.  Skin: Skin is warm and dry.  Good skin turgor  Psychiatric: She has a normal mood and affect.   Results for orders placed or performed in visit on 08/18/19 (from the past 24 hour(s))  POCT Urinalysis Dipstick     Status: Abnormal   Collection Time: 08/18/19  9:58 AM  Result Value Ref Range   Color, UA dark amber    Clarity, UA cloudy    Glucose, UA Negative Negative   Bilirubin, UA neg    Ketones, UA large    Spec Grav, UA >=1.030 (A) 1.010 - 1.025   Blood, UA neg    pH, UA 5.0 5.0 - 8.0   Protein, UA Positive (A) Negative   Urobilinogen, UA 0.2 0.2 or 1.0 E.U./dL   Nitrite, UA neg    Leukocytes, UA Small (1+) (A) Negative   Appearance abnormal    Odor normal     Assessment: 24 y.o. G2P1001 with nausea and vomiting of pregnancy Will change antiemetics and see if she improves. Advised that if no improvement, she may need IP treatment  Plan: Problem List Items Addressed This Visit     None    Visit Diagnoses    Bacteriuria during pregnancy in first trimester    -  Primary   Relevant Orders   Urine Culture   POCT Urinalysis Dipstick (Completed)   Irregular menses       Relevant Orders   Beta HCG, Quant (LC)   Nausea and vomiting during pregnancy       Relevant Orders   BMP (LC)   POCT Urinalysis Dipstick (Completed)     Will start on Unisom and vitamin B6-see AVS for instructions. If fails this trial, will send RX for DIclegis with PA. Phenergan 12.5 mgm - 25 mgm q4-6 hours for breakthrough nausea and vomiting.  Discussed eating small amounts frequently and to avoid drinking when eating. Also recommended avoiding greasy, fried or fatty foods.  Follow up with me early next week.  Has NOB scheduled 18 January  Farrel Conners, [place]

## 2019-08-18 NOTE — Patient Instructions (Signed)
Doxylamine (Unisom) 1/2 tab in AM, then 1/2 tab 6-8 hours later,then 1 tablet at bedtime. Add 25 mgm vitamin B6 with each dose of doxylamine or 50 mgm twice a day.  Phenergan 12.5 mgm every 4-6 hours prn nausea and vomiting. Can take 25 mgm in PM   Hyperemesis Gravidarum Hyperemesis gravidarum is a severe form of nausea and vomiting that happens during pregnancy. Hyperemesis is worse than morning sickness. It may cause you to have nausea or vomiting all day for many days. It may keep you from eating and drinking enough food and liquids, which can lead to dehydration, malnutrition, and weight loss. Hyperemesis usually occurs during the first half (the first 20 weeks) of pregnancy. It often goes away once a woman is in her second half of pregnancy. However, sometimes hyperemesis continues through an entire pregnancy. What are the causes? The cause of this condition is not known. It may be related to changes in chemicals (hormones) in the body during pregnancy, such as the high level of pregnancy hormone (human chorionic gonadotropin) or the increase in the female sex hormone (estrogen). What are the signs or symptoms? Symptoms of this condition include:  Nausea that does not go away.  Vomiting that does not allow you to keep any food down.  Weight loss.  Body fluid loss (dehydration).  Having no desire to eat, or not liking food that you have previously enjoyed. How is this diagnosed? This condition may be diagnosed based on:  A physical exam.  Your medical history.  Your symptoms.  Blood tests.  Urine tests. How is this treated? This condition is managed by controlling symptoms. This may include:  Following an eating plan. This can help lessen nausea and vomiting.  Taking prescription medicines. An eating plan and medicines are often used together to help control symptoms. If medicines do not help relieve nausea and vomiting, you may need to receive fluids through an IV at the  hospital. Follow these instructions at home: Eating and drinking   Avoid the following: ? Drinking fluids with meals. Try not to drink anything during the 30 minutes before and after your meals. ? Drinking more than 1 cup of fluid at a time. ? Eating foods that trigger your symptoms. These may include spicy foods, coffee, high-fat foods, very sweet foods, and acidic foods. ? Skipping meals. Nausea can be more intense on an empty stomach. If you cannot tolerate food, do not force it. Try sucking on ice chips or other frozen items and make up for missed calories later. ? Lying down within 2 hours after eating. ? Being exposed to environmental triggers. These may include food smells, smoky rooms, closed spaces, rooms with strong smells, warm or humid places, overly loud and noisy rooms, and rooms with motion or flickering lights. Try eating meals in a well-ventilated area that is free of strong smells. ? Quick and sudden changes in your movement. ? Taking iron pills and multivitamins that contain iron. If you take prescription iron pills, do not stop taking them unless your health care provider approves. ? Preparing food. The smell of food can spoil your appetite or trigger nausea.  To help relieve your symptoms: ? Listen to your body. Everyone is different and has different preferences. Find what works best for you. ? Eat and drink slowly. ? Eat 5-6 small meals daily instead of 3 large meals. Eating small meals and snacks can help you avoid an empty stomach. ? In the morning, before getting out of  bed, eat a couple of crackers to avoid moving around on an empty stomach. ? Try eating starchy foods as these are usually tolerated well. Examples include cereal, toast, bread, potatoes, pasta, rice, and pretzels. ? Include at least 1 serving of protein with your meals and snacks. Protein options include lean meats, poultry, seafood, beans, nuts, nut butters, eggs, cheese, and yogurt. ? Try eating a  protein-rich snack before bed. Examples of a protein-rick snack include cheese and crackers or a peanut butter sandwich made with 1 slice of whole-wheat bread and 1 tsp (5 g) of peanut butter. ? Eat or suck on things that have ginger in them. It may help relieve nausea. Add  tsp ground ginger to hot tea or choose ginger tea. ? Try drinking 100% fruit juice or an electrolyte drink. An electrolyte drink contains sodium, potassium, and chloride. ? Drink fluids that are cold, clear, and carbonated or sour. Examples include lemonade, ginger ale, lemon-lime soda, ice water, and sparkling water. ? Brush your teeth or use a mouth rinse after meals. ? Talk with your health care provider about starting a supplement of vitamin B6. General instructions  Take over-the-counter and prescription medicines only as told by your health care provider.  Follow instructions from your health care provider about eating or drinking restrictions.  Continue to take your prenatal vitamins as told by your health care provider. If you are having trouble taking your prenatal vitamins, talk with your health care provider about different options.  Keep all follow-up and pre-birth (prenatal) visits as told by your health care provider. This is important. Contact a health care provider if:  You have pain in your abdomen.  You have a severe headache.  You have vision problems.  You are losing weight.  You feel weak or dizzy. Get help right away if:  You cannot drink fluids without vomiting.  You vomit blood.  You have constant nausea and vomiting.  You are very weak.  You faint.  You have a fever and your symptoms suddenly get worse. Summary  Hyperemesis gravidarum is a severe form of nausea and vomiting that happens during pregnancy.  Making some changes to your eating habits may help relieve nausea and vomiting.  This condition may be managed with medicine.  If medicines do not help relieve nausea and  vomiting, you may need to receive fluids through an IV at the hospital. This information is not intended to replace advice given to you by your health care provider. Make sure you discuss any questions you have with your health care provider. Document Revised: 08/17/2017 Document Reviewed: 03/26/2016 Elsevier Patient Education  [place].

## 2019-08-19 LAB — BASIC METABOLIC PANEL
BUN/Creatinine Ratio: 10 (ref 9–23)
BUN: 7 mg/dL (ref 6–20)
CO2: 18 mmol/L — ABNORMAL LOW (ref 20–29)
Calcium: 9.3 mg/dL (ref 8.7–10.2)
Chloride: 104 mmol/L (ref 96–106)
Creatinine, Ser: 0.67 mg/dL (ref 0.57–1.00)
GFR calc Af Amer: 142 mL/min/{1.73_m2} (ref >59)
GFR calc non Af Amer: 123 mL/min/{1.73_m2} (ref >59)
Glucose: 83 mg/dL (ref 65–99)
Potassium: 4.1 mmol/L (ref 3.5–5.2)
Sodium: 139 mmol/L (ref 134–144)

## 2019-08-19 LAB — BETA HCG QUANT (REF LAB): hCG Quant: 270 m[IU]/mL

## 2019-08-20 LAB — URINE CULTURE

## 2019-08-22 ENCOUNTER — Ambulatory Visit: Payer: BC Managed Care – PPO | Admitting: Certified Nurse Midwife

## 2019-08-22 ENCOUNTER — Encounter: Payer: Self-pay | Admitting: Certified Nurse Midwife

## 2019-08-22 ENCOUNTER — Other Ambulatory Visit: Payer: Self-pay

## 2019-08-22 VITALS — BP 120/82 | HR 78 | Ht 68.0 in | Wt 247.0 lb

## 2019-08-22 DIAGNOSIS — Z3A01 Less than 8 weeks gestation of pregnancy: Secondary | ICD-10-CM | POA: Diagnosis not present

## 2019-08-22 DIAGNOSIS — O219 Vomiting of pregnancy, unspecified: Secondary | ICD-10-CM

## 2019-08-22 DIAGNOSIS — O0990 Supervision of high risk pregnancy, unspecified, unspecified trimester: Secondary | ICD-10-CM

## 2019-08-22 DIAGNOSIS — O0991 Supervision of high risk pregnancy, unspecified, first trimester: Secondary | ICD-10-CM

## 2019-08-22 DIAGNOSIS — O210 Mild hyperemesis gravidarum: Secondary | ICD-10-CM | POA: Insufficient documentation

## 2019-08-22 MED ORDER — DOXYLAMINE-PYRIDOXINE 10-10 MG PO TBEC: 2.0000 | DELAYED_RELEASE_TABLET | Freq: Every day | ORAL | 5 refills | Status: DC

## 2019-08-22 NOTE — Progress Notes (Signed)
  History of Present Illness:  Madison Mack is a 24 y.o. G2 P1001, LMP 16 July 2019 and now 5wk 3 days gestation who was started on Unisom/vitamin B6 and phenergan po for nausea and vomiting of pregnancy 4 days ago. She states that the Unisom has not helped with nausea and vomiting. Phenergan works for about 2 hours. The other day she vomited all day. Has been able to keep down some food and fluids. Has some smell aversions. Did not pick up the Phenergan suppositories. Would like a RX for Diclegis which was the only thing that worked for her nausea and vomiting with her first pregnancy. Has her NOB appointment on 18 January.  BMP on 7 Jan was essentially normal.  PMHx: She  has a past medical history of Allergy, Anxiety, HA (headache), and Nausea & vomiting. Also,  has a past surgical history that includes Ankle fracture surgery (Right, 04/08) and Esophagogastroduodenoscopy (egd) with propofol (N/A, 08/10/2019)., family history includes ADD / ADHD in her maternal aunt; Alcohol abuse in her maternal aunt and maternal uncle; Anxiety disorder in her maternal aunt and paternal grandfather; Dementia in her paternal grandmother; Depression in her paternal grandfather; Drug abuse in her maternal aunt and maternal uncle; Hyperlipidemia in her father; Hypertension in her mother.,  reports that she has never smoked. She has never used smokeless tobacco. She reports that she does not drink alcohol or use drugs.  She has a current medication list which includes the following prescription(s): nitrofurantoin (macrocrystal-monohydrate), omeprazole, promethazine, acetaminophen, dimenhydrinate, diphenhydramine, doxylamine-pyridoxine, ondansetron, and promethazine. Also, is allergic to penicillins; amoxicillin; and doxycycline.  ROS  Physical Exam:  BP 120/82   Pulse 78   Ht 5\' 8"  (1.727 m)   Wt 247 lb (112 kg)   LMP 07/16/2019 (Exact Date)   BMI 37.56 kg/m  Body mass index is 37.56 kg/m. Constitutional:  Well nourished, well developed female in no acute distress. Neuro: Grossly intact Psych:  Normal mood and affect.   Weight up 2# over the last 4 days Urine dipstick: still with large ketones. Assessment: Medication treatment for nausea and vomiting of pregnancy is not going well  Plan: Will place RX for Diclegis #100/ RF x5 (has failed trial with Unisom and vitamin B6 RTN for NOB appointment. Add .viability/ dating scan  14/12/2018, CNM [facility], [facility] 08/22/2019  5:09 PM

## 2019-08-23 ENCOUNTER — Telehealth: Payer: Self-pay | Admitting: Certified Nurse Midwife

## 2019-08-23 NOTE — Telephone Encounter (Signed)
It was sent and she picked it up this afternoon.

## 2019-08-23 NOTE — Telephone Encounter (Signed)
Patient states she was prescribed Rx for diclegis yesterday, has checked with pharmacy and they did not receive, if this could be sent.  Karin Golden.

## 2019-08-26 ENCOUNTER — Ambulatory Visit (INDEPENDENT_AMBULATORY_CARE_PROVIDER_SITE_OTHER): Payer: BC Managed Care – PPO

## 2019-08-26 ENCOUNTER — Other Ambulatory Visit: Payer: Self-pay

## 2019-08-26 DIAGNOSIS — N8311 Corpus luteum cyst of right ovary: Secondary | ICD-10-CM | POA: Diagnosis not present

## 2019-08-26 DIAGNOSIS — Z3A01 Less than 8 weeks gestation of pregnancy: Secondary | ICD-10-CM

## 2019-08-26 DIAGNOSIS — O3481 Maternal care for other abnormalities of pelvic organs, first trimester: Secondary | ICD-10-CM

## 2019-08-26 DIAGNOSIS — O0990 Supervision of high risk pregnancy, unspecified, unspecified trimester: Secondary | ICD-10-CM

## 2019-08-26 DIAGNOSIS — O219 Vomiting of pregnancy, unspecified: Secondary | ICD-10-CM

## 2019-08-29 ENCOUNTER — Other Ambulatory Visit: Payer: Self-pay

## 2019-08-29 ENCOUNTER — Ambulatory Visit (INDEPENDENT_AMBULATORY_CARE_PROVIDER_SITE_OTHER): Payer: BC Managed Care – PPO | Admitting: Obstetrics and Gynecology

## 2019-08-29 ENCOUNTER — Other Ambulatory Visit (HOSPITAL_COMMUNITY)
Admission: RE | Admit: 2019-08-29 | Discharge: 2019-08-29 | Disposition: A | Discharge status: Home / Self Care | Payer: BC Managed Care – PPO | Source: Ambulatory Visit | Attending: Obstetrics and Gynecology | Admitting: Obstetrics and Gynecology

## 2019-08-29 ENCOUNTER — Encounter: Payer: Self-pay | Admitting: Obstetrics and Gynecology

## 2019-08-29 VITALS — BP 122/74 | Wt 246.0 lb

## 2019-08-29 DIAGNOSIS — Z3481 Encounter for supervision of other normal pregnancy, first trimester: Secondary | ICD-10-CM | POA: Diagnosis present

## 2019-08-29 DIAGNOSIS — Z6837 Body mass index (BMI) 37.0-37.9, adult: Secondary | ICD-10-CM | POA: Insufficient documentation

## 2019-08-29 DIAGNOSIS — Z3490 Encounter for supervision of normal pregnancy, unspecified, unspecified trimester: Secondary | ICD-10-CM | POA: Insufficient documentation

## 2019-08-29 DIAGNOSIS — O99211 Obesity complicating pregnancy, first trimester: Secondary | ICD-10-CM | POA: Insufficient documentation

## 2019-08-29 DIAGNOSIS — Z3A01 Less than 8 weeks gestation of pregnancy: Secondary | ICD-10-CM

## 2019-08-29 DIAGNOSIS — O0990 Supervision of high risk pregnancy, unspecified, unspecified trimester: Secondary | ICD-10-CM | POA: Insufficient documentation

## 2019-08-29 NOTE — Progress Notes (Signed)
New Obstetric Patient H&P   Chief Complaint: "Desires prenatal care"   History of Present Illness: Patient is a 24 y.o. G2P1001 Not Hispanic or Latino female, sure LMP 07/16/2019 presents with amenorrhea and positive home pregnancy test. Based on her  LMP, her EDD is Estimated Date of Delivery: 04/21/2020 and her EGA is [redacted]w[redacted]d. Cycles are have been mostly regular at 30-34 days between, lasting 3-4 days.  Her last pap smear was 3 or 4 years ago and she believes that the cells were abnormal.    She had a urine pregnancy test which was positive 2 week(s)  ago. Her last menstrual period was normal and lasted for  3 or 4 day(s). Since her LMP she claims she has experienced no issues. She denies vaginal bleeding. Her past medical history is noncontributory. Her prior pregnancies are notable for preterm labor with delivery at [redacted]w[redacted]d.  She had SROM and went on to deliver a 7 lb15 oz female infant vaginally without issues.    Since her LMP, she admits to the use of tobacco products  no She claims she has gained zero pounds since the start of her pregnancy.  There are cats in the home in the home  no She admits close contact with children on a regular basis  yes  She has had chicken pox in the past yes She has had Tuberculosis exposures, symptoms, or previously tested positive for TB   no Current or past history of domestic violence. no  Genetic Screening/Teratology Counseling: (Includes patient, baby's father, or anyone in either family with:)   57. Patient's age >/= 53 at [facility]  no 2. Thalassemia ([place], [place], [place], or Asian background): MCV<80  no 3. Neural tube defect (meningomyelocele, spina bifida, anencephaly)  no 4. Congenital heart defect  no  5. Down syndrome  no 6. Tay-Sachs (Jewish, [place])  no 7. Canavan's Disease  no 8. Sickle cell disease or trait (African)  no  9. Hemophilia or other blood disorders  no  10. Muscular dystrophy  no  11. Cystic fibrosis  no  12.  Huntington's Chorea  no  13. Mental retardation/autism  no 14. Other inherited genetic or chromosomal disorder  no 15. Maternal metabolic disorder (DM, PKU, etc)  no 16. Patient or FOB with a child with a birth defect not listed above no  16a. Patient or FOB with a birth defect themselves no 17. Recurrent pregnancy loss, or stillbirth  no  18. Any medications since LMP other than prenatal vitamins (include vitamins, supplements, OTC meds, drugs, alcohol)  Omeprazole.   19. Any other genetic/environmental exposure to discuss  no  Infection History:   1. Lives with someone with TB or TB exposed  no  2. Patient or partner has history of genital herpes  no 3. Rash or viral illness since LMP  no 4. History of STI (GC, CT, HPV, syphilis, HIV)  no 5. History of recent travel :  no  Other pertinent information:  no   Review of Systems:10 point review of systems negative unless otherwise noted in HPI  Past Medical History:  Diagnosis Date  . Allergy   . Anxiety   . HA (headache)   . Nausea & vomiting     Past Surgical History:  Procedure Laterality Date  . ANKLE FRACTURE SURGERY Right 04/08  . ESOPHAGOGASTRODUODENOSCOPY (EGD) WITH PROPOFOL N/A 08/10/2019   Procedure: ESOPHAGOGASTRODUODENOSCOPY (EGD) WITH PROPOFOL;  Surgeon: Virgel Manifold, MD;  Location: [facility] ENDOSCOPY;  Service: Endoscopy;  Laterality:  N/A;    Gynecologic History: Patient's last menstrual period was 07/16/2019 (exact date).  Obstetric History: G2P1001, s.p SVD x 1 in 2018  Family History  Problem Relation Age of Onset  . Hyperlipidemia Father   . Hypertension Mother   . ADD / ADHD Maternal Aunt   . Alcohol abuse Maternal Aunt   . Drug abuse Maternal Aunt   . Anxiety disorder Maternal Aunt   . Alcohol abuse Maternal Uncle   . Drug abuse Maternal Uncle   . Anxiety disorder Paternal Grandfather   . Depression Paternal Grandfather   . Dementia Paternal Grandmother     Social History   Socioeconomic  History  . Marital status: Married    Spouse name: thomas  . Number of children: 1  . Years of education: Not on file  . Highest education level: Bachelor's degree (e.g., BA, AB, BS)  Occupational History  . 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 Smoking status: Never Smoker  . Smokeless tobacco: Never Used  Substance and Sexual Activity  . Alcohol use: No    Alcohol/week: 0.0 standard drinks  . Drug use: No  . Sexual activity: Yes    Birth control/protection: I.U.D.  Other Topics Concern  . Not on file  Social History Narrative  . Not on file   Social Determinants of Health   Financial Resource Strain:   . Difficulty of Paying Living Expenses: Not on file  Food Insecurity:   . Worried About Programme researcher, broadcasting/film/video in the Last Year: Not on file  . Ran Out of Food in the Last Year: Not on file  Transportation Needs:   . Lack of Transportation (Medical): Not on file  . Lack of Transportation (Non-Medical): Not on file  Physical Activity:   . Days of Exercise per Week: Not on file  . Minutes of Exercise per Session: Not on file  Stress:   . Feeling of Stress : Not on file  Social Connections:   . Frequency of Communication with Friends and Family: Not on file  . Frequency of Social Gatherings with Friends and Family: Not on file  . Attends Religious Services: Not on file  . Active Member of Clubs or Organizations: Not on file  . Attends Banker Meetings: Not on file  . Marital Status: Not on file  Intimate Partner Violence:   . Fear of Current or Ex-Partner: Not on file  . Emotionally Abused: Not on file  . Physically Abused: Not on file  . Sexually Abused: Not on file    Allergies  Allergen Reactions  . Penicillins Rash    Has patient had a PCN reaction causing immediate rash, facial/tongue/throat swelling, SOB or lightheadedness with hypotension: SWELLING FEET AND HANDS, HIVES Has patient had a PCN reaction  causing severe rash involving mucus membranes or skin necrosis: NO Has patient had a PCN reaction that required hospitalization: NO Has patient had a PCN reaction occurring within the last 10 years: NO If all of the above answers are "NO", then may proceed with Cephalosporin use.   Marland Kitchen Amoxicillin   . Doxycycline Hives   Prior to Admission medications   Medication Sig Start Date End Date Taking? Authorizing Bruk [place]  Doxylamine-Pyridoxine (DICLEGIS) 10-10 MG TBEC Take 2 tablets by mouth at bedtime. If symptoms persist, add one tablet in the morning and one in the afternoon 08/22/19  Yes Farrel Conners, CNM  Prenatal Vit-Fe Fumarate-FA (MULTIVITAMIN-PRENATAL) 27-0.8  MG TABS tablet Take 1 tablet by mouth daily at 12 noon.   Yes Jermario Kalmar, Historical, MD    Physical Exam BP 122/74   Wt 246 lb (111.6 kg)   LMP 07/16/2019 (Exact Date)   BMI 37.40 kg/m   Physical Exam Exam conducted with a chaperone present.  Constitutional:      General: She is not in acute distress.    Appearance: Normal appearance. She is well-developed.  HENT:     Head: Normocephalic and atraumatic.  Eyes:     General: No scleral icterus.    Conjunctiva/sclera: Conjunctivae normal.  Neck:     Thyroid: No thyromegaly.  Cardiovascular:     Rate and Rhythm: Normal rate and regular rhythm.     Heart sounds: Normal heart sounds. No murmur. No friction rub. No gallop.   Pulmonary:     Effort: Pulmonary effort is normal.     Breath sounds: Normal breath sounds. No wheezing.  Abdominal:     General: There is no distension.     Palpations: Abdomen is soft. There is no mass.     Tenderness: There is no abdominal tenderness. There is no guarding or rebound.     Hernia: No hernia is present. There is no hernia in the left inguinal area.  Genitourinary:    General: Normal vulva.     Exam position: Lithotomy position.     Tanner stage (genital): 5.     Labia:        Right: No rash, tenderness or lesion.        Left:  No rash, tenderness or lesion.      Vagina: Normal.     Cervix: Normal.     Uterus: Normal.      Adnexa: Right adnexa normal and left adnexa normal.  Musculoskeletal:        General: Normal range of motion.     Cervical back: Normal range of motion and neck supple.  Skin:    General: Skin is warm and dry.     Findings: No rash.  Neurological:     General: No focal deficit present.     Mental Status: She is alert and oriented to person, place, and time.     Cranial Nerves: No cranial nerve deficit.  Psychiatric:        Mood and Affect: Mood normal.        Behavior: Behavior normal.        Judgment: Judgment normal.      Female Chaperone present during breast and/or pelvic exam.   Assessment: 24 y.o. G2P1001 at [redacted]w[redacted]d presenting to initiate prenatal care  Plan: 1) Avoid alcoholic beverages. 2) Patient encouraged not to smoke.  3) Discontinue the use of all non-medicinal drugs and chemicals.  4) Take prenatal vitamins daily.  5) Nutrition, food safety (fish, cheese advisories, and high nitrite foods) and exercise discussed. 6) Hospital and practice style discussed with cross coverage system.  7) Genetic Screening, such as with 1st Trimester Screening, cell free fetal DNA, AFP testing, and Ultrasound, as well as with amniocentesis and CVS as appropriate, is discussed with patient. At the conclusion of today's visit patient undecided genetic testing 8) Patient is asked about travel to areas at risk for the [place] virus, and counseled to avoid travel and exposure to mosquitoes or sexual partners who may have themselves been exposed to the virus. Testing is discussed, and will be ordered as appropriate.   Thomasene Mohair, MD 08/29/2019 10:05 AM

## 2019-08-30 LAB — RPR+RH+ABO+RUB AB+AB SCR+CB...
Antibody Screen: NEGATIVE
HIV Screen 4th Generation wRfx: NONREACTIVE
Hematocrit: 42.1 % (ref 34.0–46.6)
Hemoglobin: 14.1 g/dL (ref 11.1–15.9)
Hepatitis B Surface Ag: NEGATIVE
MCH: 29 pg (ref 26.6–33.0)
MCHC: 33.5 g/dL (ref 31.5–35.7)
MCV: 87 fL (ref 79–97)
Platelets: 248 10*3/uL (ref 150–450)
RBC: 4.86 x10E6/uL (ref 3.77–5.28)
RDW: 12.8 % (ref 11.7–15.4)
RPR Ser Ql: NONREACTIVE
Rh Factor: NEGATIVE
Rubella Antibodies, IGG: 3.06 index (ref >0.99)
Varicella zoster IgG: 2296 index (ref >165)
WBC: 7.9 10*3/uL (ref 3.4–10.8)

## 2019-08-30 LAB — HEMOGLOBINOPATHY EVALUATION
HGB C: 0 %
HGB S: 0 %
HGB VARIANT: 0 %
Hemoglobin A2 Quantitation: 2.1 % (ref 1.8–3.2)
Hemoglobin F Quantitation: 0 % (ref 0.0–2.0)
Hgb A: 97.9 % (ref 96.4–98.8)

## 2019-08-31 LAB — CYTOLOGY - PAP
Chlamydia: NEGATIVE
Comment: NEGATIVE
Comment: NORMAL
Diagnosis: NEGATIVE
Neisseria Gonorrhea: NEGATIVE

## 2019-08-31 LAB — URINE CULTURE

## 2019-09-01 LAB — URINE DRUG PANEL 7
Amphetamines, Urine: NEGATIVE ng/mL
Barbiturate Quant, Ur: NEGATIVE ng/mL
Benzodiazepine Quant, Ur: NEGATIVE ng/mL
Cannabinoid Quant, Ur: POSITIVE — AB
Cocaine (Metab.): NEGATIVE ng/mL
Opiate Quant, Ur: NEGATIVE ng/mL
PCP Quant, Ur: NEGATIVE ng/mL

## 2019-09-02 ENCOUNTER — Other Ambulatory Visit: Payer: Self-pay | Admitting: Obstetrics and Gynecology

## 2019-09-02 DIAGNOSIS — O219 Vomiting of pregnancy, unspecified: Secondary | ICD-10-CM

## 2019-09-02 DIAGNOSIS — Z3481 Encounter for supervision of other normal pregnancy, first trimester: Secondary | ICD-10-CM

## 2019-09-02 MED ORDER — METOCLOPRAMIDE HCL 10 MG PO TABS: 10.0000 mg | ORAL_TABLET | Freq: Three times a day (TID) | ORAL | 2 refills | Status: DC | PRN

## 2019-09-05 ENCOUNTER — Telehealth: Payer: Self-pay

## 2019-09-05 ENCOUNTER — Emergency Department
Admission: EM | Admit: 2019-09-05 | Discharge: 2019-09-05 | Disposition: A | Discharge status: Home / Self Care | Payer: BC Managed Care – PPO | Attending: Emergency Medicine | Admitting: Emergency Medicine

## 2019-09-05 ENCOUNTER — Encounter: Payer: Self-pay | Admitting: Emergency Medicine

## 2019-09-05 ENCOUNTER — Other Ambulatory Visit: Payer: Self-pay

## 2019-09-05 DIAGNOSIS — Z79899 Other long term (current) drug therapy: Secondary | ICD-10-CM | POA: Insufficient documentation

## 2019-09-05 DIAGNOSIS — Z3A01 Less than 8 weeks gestation of pregnancy: Secondary | ICD-10-CM | POA: Diagnosis not present

## 2019-09-05 DIAGNOSIS — O210 Mild hyperemesis gravidarum: Secondary | ICD-10-CM | POA: Diagnosis present

## 2019-09-05 LAB — HEPATIC FUNCTION PANEL
ALT: 19 U/L (ref 0–44)
AST: 16 U/L (ref 15–41)
Albumin: 3.6 g/dL (ref 3.5–5.0)
Alkaline Phosphatase: 57 U/L (ref 38–126)
Bilirubin, Direct: 0.2 mg/dL (ref 0.0–0.2)
Indirect Bilirubin: 0.9 mg/dL (ref 0.3–0.9)
Total Bilirubin: 1.1 mg/dL (ref 0.3–1.2)
Total Protein: 6.9 g/dL (ref 6.5–8.1)

## 2019-09-05 LAB — BASIC METABOLIC PANEL
Anion gap: 11 (ref 5–15)
BUN: <5 mg/dL — ABNORMAL LOW (ref 6–20)
CO2: 18 mmol/L — ABNORMAL LOW (ref 22–32)
Calcium: 8.2 mg/dL — ABNORMAL LOW (ref 8.9–10.3)
Chloride: 110 mmol/L (ref 98–111)
Creatinine, Ser: 0.49 mg/dL (ref 0.44–1.00)
GFR calc Af Amer: >60 mL/min (ref >60)
GFR calc non Af Amer: >60 mL/min (ref >60)
Glucose, Bld: 84 mg/dL (ref 70–99)
Potassium: 3.2 mmol/L — ABNORMAL LOW (ref 3.5–5.1)
Sodium: 139 mmol/L (ref 135–145)

## 2019-09-05 LAB — CBC WITH DIFFERENTIAL/PLATELET
Abs Immature Granulocytes: 0.03 10*3/uL (ref 0.00–0.07)
Basophils Absolute: 0 10*3/uL (ref 0.0–0.1)
Basophils Relative: 0 %
Eosinophils Absolute: 0 10*3/uL (ref 0.0–0.5)
Eosinophils Relative: 0 %
HCT: 40.8 % (ref 36.0–46.0)
Hemoglobin: 13.5 g/dL (ref 12.0–15.0)
Immature Granulocytes: 0 %
Lymphocytes Relative: 16 %
Lymphs Abs: 1.6 10*3/uL (ref 0.7–4.0)
MCH: 28.8 pg (ref 26.0–34.0)
MCHC: 33.1 g/dL (ref 30.0–36.0)
MCV: 87 fL (ref 80.0–100.0)
Monocytes Absolute: 0.6 10*3/uL (ref 0.1–1.0)
Monocytes Relative: 6 %
Neutro Abs: 7.6 10*3/uL (ref 1.7–7.7)
Neutrophils Relative %: 78 %
Platelets: 228 10*3/uL (ref 150–400)
RBC: 4.69 MIL/uL (ref 3.87–5.11)
RDW: 13.4 % (ref 11.5–15.5)
WBC: 9.9 10*3/uL (ref 4.0–10.5)
nRBC: 0 % (ref 0.0–0.2)

## 2019-09-05 LAB — URINALYSIS, COMPLETE (UACMP) WITH MICROSCOPIC
Bacteria, UA: NONE SEEN
Bilirubin Urine: NEGATIVE
Glucose, UA: NEGATIVE mg/dL
Hgb urine dipstick: NEGATIVE
Ketones, ur: 80 mg/dL — AB
Nitrite: NEGATIVE
Protein, ur: NEGATIVE mg/dL
Specific Gravity, Urine: 1.014 (ref 1.005–1.030)
pH: 6 (ref 5.0–8.0)

## 2019-09-05 LAB — LIPASE, BLOOD: Lipase: 20 U/L (ref 11–51)

## 2019-09-05 MED ORDER — SODIUM CHLORIDE 0.9 % IV BOLUS
1000.0000 mL | Freq: Once | INTRAVENOUS | Status: AC
  Administered 2019-09-05: 1000 mL via INTRAVENOUS

## 2019-09-05 MED ORDER — ONDANSETRON HCL 4 MG/2ML IJ SOLN
4.0000 mg | Freq: Once | INTRAMUSCULAR | Status: AC
  Administered 2019-09-05: 4 mg via INTRAVENOUS
  Filled 2019-09-05: qty 2

## 2019-09-05 MED ORDER — FAMOTIDINE IN NACL 20-0.9 MG/50ML-% IV SOLN
20.0000 mg | Freq: Once | INTRAVENOUS | Status: AC
  Administered 2019-09-05: 20 mg via INTRAVENOUS
  Filled 2019-09-05: qty 50

## 2019-09-05 MED ORDER — POTASSIUM CHLORIDE 10 MEQ/100ML IV SOLN
10.0000 meq | Freq: Once | INTRAVENOUS | Status: AC
  Administered 2019-09-05: 10 meq via INTRAVENOUS
  Filled 2019-09-05: qty 100

## 2019-09-05 NOTE — ED Triage Notes (Signed)
Presents with abd discomfort d/t vomiting  States she is about 7 weeks preg  Unable to keep food down

## 2019-09-05 NOTE — Discharge Instructions (Addendum)
Return to the ER for new, worsening, or persistent severe vomiting, abdominal pain, weakness, or any other new or worsening symptoms that concern you.  You should continue the omeprazole and nausea medication prescribed by your OB/GYN.  Follow-up with your OB/GYN as scheduled.

## 2019-09-05 NOTE — Telephone Encounter (Signed)
Advised pt to be seen in ER for IV fluids.

## 2019-09-05 NOTE — Telephone Encounter (Signed)
Pt calling triage line stating she has been on all kinds of nausea medication and nothing is helping her, she says her stomach is so sore, and hurting, she feels dizzy, cant eat, losing weight she is currently at 238 which is 8 lbs lost in 1 week. Pt is desperate and crying she is in so much pain. Please advise.

## 2019-09-05 NOTE — ED Provider Notes (Signed)
[facility] Emergency Department Provider Note ____________________________________________   First MD Initiated Contact with Patient 09/05/19 1158     (approximate)  I have reviewed the triage vital signs and the nursing notes.   HISTORY  Chief Complaint Morning Sickness    HPI Madison Mack is a 24 y.o. female with PMH as noted below who is a G2 P1 at 7 weeks by dates and who presents with persistent nausea and vomiting over the last week, not relieved by multiple nausea medications which she has tried, and now associated with epigastric abdominal pain.  The patient states her most recent episode of vomiting was this morning.  She also has had some loose stools.  She denies any fever or chills and has no vaginal bleeding or urinary symptoms.  She states that she has had very little p.o. intake and was feeling dizzy and weak today.  Past Medical History:  Diagnosis Date  . Allergy   . Anxiety   . HA (headache)   . Nausea & vomiting     Patient Active Problem List   Diagnosis Date Noted  . Supervision of normal pregnancy 08/29/2019  . Obesity affecting pregnancy in first trimester 08/29/2019  . BMI 37.0-37.9, adult 08/29/2019  . Nausea and vomiting of pregnancy, antepartum 08/22/2019  . Intractable vomiting   . Barrett's esophagus without dysplasia   . MDD (major depressive disorder), recurrent episode, mild (HCC) 09/08/2018  . Cyclic vomiting syndrome 09/08/2018  . ADD (attention deficit disorder) 09/08/2018    Past Surgical History:  Procedure Laterality Date  . ANKLE FRACTURE SURGERY Right 04/08  . ESOPHAGOGASTRODUODENOSCOPY (EGD) WITH PROPOFOL N/A 08/10/2019   Procedure: ESOPHAGOGASTRODUODENOSCOPY (EGD) WITH PROPOFOL;  Surgeon: Pasty Spillers, MD;  Location: [facility] ENDOSCOPY;  Service: Endoscopy;  Laterality: N/A;    Prior to Admission medications   Medication Sig Start Date End Date Taking? Authorizing Provider  acetaminophen  (TYLENOL) 500 MG tablet Take 500 mg by mouth every 6 (six) hours as needed.    [provider]  dimenhyDRINATE (DRAMAMINE) 50 MG tablet Take 50 mg by mouth every 8 (eight) hours as needed.    [provider]  diphenhydrAMINE (BENADRYL) 25 MG tablet Take 25 mg by mouth every 6 (six) hours as needed.    [provider]  Doxylamine-Pyridoxine (DICLEGIS) 10-10 MG TBEC Take 2 tablets by mouth at bedtime. If symptoms persist, add one tablet in the morning and one in the afternoon 08/22/19   Farrel Conners, CNM  metoCLOPramide (REGLAN) 10 MG tablet Take 1 tablet (10 mg total) by mouth 3 (three) times daily as needed for nausea or vomiting. 09/02/19   Conard Novak, MD  omeprazole (PRILOSEC) 20 MG capsule Take 20 mg by mouth daily.    [provider]  Prenatal Vit-Fe Fumarate-FA (MULTIVITAMIN-PRENATAL) 27-0.8 MG TABS tablet Take 1 tablet by mouth daily at 12 noon.    [provider]    Allergies Penicillins, Amoxicillin, and Doxycycline  Family History  Problem Relation Age of Onset  . Hyperlipidemia Father   . Hypertension Mother   . ADD / ADHD Maternal Aunt   . Alcohol abuse Maternal Aunt   . Drug abuse Maternal Aunt   . Anxiety disorder Maternal Aunt   . Alcohol abuse Maternal Uncle   . Drug abuse Maternal Uncle   . Anxiety disorder Paternal Grandfather   . Depression Paternal Grandfather   . Dementia Paternal Grandmother     Social History Social History  Tobacco Use  . Smoking status: Never Smoker  . Smokeless tobacco: Never Used  Substance Use Topics  . Alcohol use: No    Alcohol/week: 0.0 standard drinks  . Drug use: No    Review of Systems  Constitutional: No fever. Eyes: No redness. ENT: No sore throat. Cardiovascular: Denies chest pain. Respiratory: Denies shortness of breath. Gastrointestinal: Positive for vomiting. Genitourinary: Negative for dysuria.  Musculoskeletal: Negative for back pain. Skin: Negative for  rash. Neurological: Negative for headache.   ____________________________________________   PHYSICAL EXAM:  VITAL SIGNS: ED Triage Vitals  Enc Vitals Group     BP 09/05/19 1154 (!) 124/46     Pulse Rate 09/05/19 1154 86     Resp 09/05/19 1154 18     Temp 09/05/19 1154 98.7 F (37.1 C)     Temp Source 09/05/19 1154 Oral     SpO2 09/05/19 1154 100 %     Weight 09/05/19 1155 239 lb (108.4 kg)     Height 09/05/19 1155 5\' 8"  (1.727 m)     Head Circumference --      Peak Flow --      Pain Score --      Pain Loc --      Pain Edu? --      Excl. in [place]? --     Constitutional: Alert and oriented.  Relatively well appearing and in no acute distress. Eyes: Conjunctivae are normal.  Head: Atraumatic. Nose: No congestion/rhinnorhea. Mouth/Throat: Mucous membranes are somewhat dry. Neck: Normal range of motion.  Cardiovascular: Normal rate, regular rhythm. Good peripheral circulation. Respiratory: Normal respiratory effort.  No retractions.  Gastrointestinal: Soft with mild epigastric tenderness.  No distention.  Genitourinary: No flank tenderness. Musculoskeletal: Extremities warm and well perfused.  Neurologic:  Normal speech and language. No gross focal neurologic deficits are appreciated.  Skin:  Skin is warm and dry. No rash noted. Psychiatric: Mood and affect are normal. Speech and behavior are normal.  ____________________________________________   LABS (all labs ordered are listed, but only abnormal results are displayed)  Labs Reviewed  BASIC METABOLIC PANEL - Abnormal; Notable for the following components:      Result Value   Potassium 3.2 (*)    CO2 18 (*)    BUN <5 (*)    Calcium 8.2 (*)    All other components within normal limits  URINALYSIS, COMPLETE (UACMP) WITH MICROSCOPIC - Abnormal; Notable for the following components:   Color, Urine YELLOW (*)    APPearance HAZY (*)    Ketones, ur 80 (*)    Leukocytes,Ua SMALL (*)    All other components within  normal limits  HEPATIC FUNCTION PANEL  LIPASE, BLOOD  CBC WITH DIFFERENTIAL/PLATELET   ____________________________________________  EKG   ____________________________________________  RADIOLOGY    ____________________________________________   PROCEDURES  Procedure(s) performed: No  Procedures  Critical Care performed: No ____________________________________________   INITIAL IMPRESSION / ASSESSMENT AND PLAN / ED COURSE  Pertinent labs & imaging results that were available during my care of the patient were reviewed by me and considered in my medical decision making (see chart for details).  24 year old female G2 P1 at approximately 7 weeks by dates presents with persistent nausea and vomiting, as well as some epigastric pain and lightheadedness.  The patient states that this has been refractory to multiple nausea medications at home.  I reviewed the past medical records in [place].  The patient follows with [facility] and sees Dr. Glennon Mac.  She had an ultrasound  on 1/15 confirming an IUP.  On exam, the patient is relatively well-appearing.  Her vital signs are normal.  She does have some epigastric tenderness and dry mucous membranes but otherwise an unremarkable exam.  Presentation is consistent with hyperemesis gravidarum.  We will give IV fluids, IV antiemetic and antacid, and obtain lab work-up to evaluate for electrolyte abnormalities or other findings secondary to her vomiting.  ----------------------------------------- 2:39 PM on 09/05/2019 -----------------------------------------  The patient states she is feeling significantly better.  The lab work-up is unremarkable except for borderline low potassium which has dropped somewhat in the last 2 weeks.  I will order a run of IV potassium to replete.  The urinalysis shows some WBCs, however the patient states that she just completed treatment for a UTI and when compared to the labs from 2 weeks ago there are no  no bacteria seen.  This is consistent with residual findings after a recent UTI rather than active UTI or asymptomatic bacteriuria, and I do not see any indication to repeat an antibiotic course.  The patient has no urinary symptoms.  I counseled the patient on the results of the work-up.  Return precautions given, and she expressed understanding.  She already has nausea medication and a PPI at home.  ____________________________________________   FINAL CLINICAL IMPRESSION(S) / ED DIAGNOSES  Final diagnoses:  Hyperemesis gravidarum      NEW MEDICATIONS STARTED DURING THIS VISIT:  New Prescriptions   No medications on file     Note:  This document was prepared using Dragon voice recognition software and may include unintentional dictation errors.    Dionne Bucy, MD 09/05/19 [phone]

## 2019-09-06 ENCOUNTER — Other Ambulatory Visit: Payer: BC Managed Care – PPO

## 2019-09-06 ENCOUNTER — Ambulatory Visit (INDEPENDENT_AMBULATORY_CARE_PROVIDER_SITE_OTHER): Payer: BC Managed Care – PPO | Admitting: Certified Nurse Midwife

## 2019-09-06 ENCOUNTER — Encounter: Payer: Self-pay | Admitting: Certified Nurse Midwife

## 2019-09-06 ENCOUNTER — Ambulatory Visit (INDEPENDENT_AMBULATORY_CARE_PROVIDER_SITE_OTHER): Payer: BC Managed Care – PPO

## 2019-09-06 VITALS — BP 120/60 | Wt 244.0 lb

## 2019-09-06 DIAGNOSIS — Z3481 Encounter for supervision of other normal pregnancy, first trimester: Secondary | ICD-10-CM

## 2019-09-06 DIAGNOSIS — Z3A01 Less than 8 weeks gestation of pregnancy: Secondary | ICD-10-CM

## 2019-09-06 DIAGNOSIS — O99211 Obesity complicating pregnancy, first trimester: Secondary | ICD-10-CM

## 2019-09-06 DIAGNOSIS — O3481 Maternal care for other abnormalities of pelvic organs, first trimester: Secondary | ICD-10-CM | POA: Diagnosis not present

## 2019-09-06 DIAGNOSIS — O219 Vomiting of pregnancy, unspecified: Secondary | ICD-10-CM

## 2019-09-06 DIAGNOSIS — Z6837 Body mass index (BMI) 37.0-37.9, adult: Secondary | ICD-10-CM

## 2019-09-06 DIAGNOSIS — O26899 Other specified pregnancy related conditions, unspecified trimester: Secondary | ICD-10-CM | POA: Insufficient documentation

## 2019-09-06 DIAGNOSIS — N8311 Corpus luteum cyst of right ovary: Secondary | ICD-10-CM | POA: Diagnosis not present

## 2019-09-06 DIAGNOSIS — Z6791 Unspecified blood type, Rh negative: Secondary | ICD-10-CM | POA: Insufficient documentation

## 2019-09-06 LAB — POCT URINALYSIS DIPSTICK OB
Glucose, UA: NEGATIVE
POC,PROTEIN,UA: NEGATIVE

## 2019-09-06 NOTE — Progress Notes (Signed)
ROB at 7wk3d: Was in ED yesterday for nausea and vomiting. Given IV hydration and Potassium.repleted. Vomited around midnight again, but has kept down water since then. Also kept down glucola this AM for her 1 hour GTT Is taking Reglan for nausea every 8 hours.Advised can also take Diclegis. Also taking omeprazole for GERD.  Dating/viability scan today: CRL 6wk6d with FCA 130. Will change EDC to 04/25/2020 since she has longer menstrual cycles (every 30-35 days)  P: ROB in 2 weeks or sooner if unable to keep down food and fluids  Farrel Conners, CNM

## 2019-09-07 LAB — GLUCOSE, 1 HOUR GESTATIONAL: Gestational Diabetes Screen: 118 mg/dL (ref 65–139)

## 2019-09-22 ENCOUNTER — Ambulatory Visit (INDEPENDENT_AMBULATORY_CARE_PROVIDER_SITE_OTHER): Payer: BC Managed Care – PPO | Admitting: Certified Nurse Midwife

## 2019-09-22 ENCOUNTER — Other Ambulatory Visit: Payer: Self-pay

## 2019-09-22 VITALS — BP 100/70 | Temp 97.4°F | Wt 244.0 lb

## 2019-09-22 DIAGNOSIS — Z3481 Encounter for supervision of other normal pregnancy, first trimester: Secondary | ICD-10-CM

## 2019-09-22 DIAGNOSIS — O219 Vomiting of pregnancy, unspecified: Secondary | ICD-10-CM

## 2019-09-22 DIAGNOSIS — Z3A09 9 weeks gestation of pregnancy: Secondary | ICD-10-CM

## 2019-09-22 LAB — POCT URINALYSIS DIPSTICK OB
Glucose, UA: NEGATIVE
POC,PROTEIN,UA: NEGATIVE

## 2019-09-22 NOTE — Progress Notes (Signed)
C/o dizzy spell 2d ago almost passed out.rj

## 2019-09-22 NOTE — Progress Notes (Signed)
ROB at 9wk1d: No further weight loss. Vomits usually twice a day-in the AM vomitus is usually more just mucous from PND. Is taking Reglan twice a day and Diclegis once a day. Had a near syncopal episode 2 days ago when she stood up suddenly. May have had othrostatic hypotension BP today 100/70 Desires genetic testing ROB in 2 weeks for MaterniT 21 and ROB/ DT check  Farrel Conners, CNM

## 2019-10-06 ENCOUNTER — Other Ambulatory Visit: Payer: Self-pay

## 2019-10-06 ENCOUNTER — Encounter: Payer: BC Managed Care – PPO | Admitting: Obstetrics & Gynecology

## 2019-10-06 ENCOUNTER — Emergency Department
Admission: EM | Admit: 2019-10-06 | Discharge: 2019-10-06 | Disposition: A | Discharge status: Home / Self Care | Payer: BC Managed Care – PPO | Attending: Emergency Medicine | Admitting: Emergency Medicine

## 2019-10-06 ENCOUNTER — Encounter: Payer: Self-pay | Admitting: Emergency Medicine

## 2019-10-06 DIAGNOSIS — E860 Dehydration: Secondary | ICD-10-CM | POA: Diagnosis not present

## 2019-10-06 DIAGNOSIS — O26891 Other specified pregnancy related conditions, first trimester: Secondary | ICD-10-CM | POA: Diagnosis present

## 2019-10-06 DIAGNOSIS — Z3A11 11 weeks gestation of pregnancy: Secondary | ICD-10-CM | POA: Insufficient documentation

## 2019-10-06 DIAGNOSIS — Z3491 Encounter for supervision of normal pregnancy, unspecified, first trimester: Secondary | ICD-10-CM

## 2019-10-06 LAB — CBC WITH DIFFERENTIAL/PLATELET
Abs Immature Granulocytes: 0.03 10*3/uL (ref 0.00–0.07)
Basophils Absolute: 0 10*3/uL (ref 0.0–0.1)
Basophils Relative: 0 %
Eosinophils Absolute: 0.1 10*3/uL (ref 0.0–0.5)
Eosinophils Relative: 1 %
HCT: 38 % (ref 36.0–46.0)
Hemoglobin: 12.6 g/dL (ref 12.0–15.0)
Immature Granulocytes: 0 %
Lymphocytes Relative: 19 %
Lymphs Abs: 1.5 10*3/uL (ref 0.7–4.0)
MCH: 29 pg (ref 26.0–34.0)
MCHC: 33.2 g/dL (ref 30.0–36.0)
MCV: 87.4 fL (ref 80.0–100.0)
Monocytes Absolute: 0.4 10*3/uL (ref 0.1–1.0)
Monocytes Relative: 5 %
Neutro Abs: 5.7 10*3/uL (ref 1.7–7.7)
Neutrophils Relative %: 75 %
Platelets: 221 10*3/uL (ref 150–400)
RBC: 4.35 MIL/uL (ref 3.87–5.11)
RDW: 13.9 % (ref 11.5–15.5)
WBC: 7.8 10*3/uL (ref 4.0–10.5)
nRBC: 0 % (ref 0.0–0.2)

## 2019-10-06 LAB — BASIC METABOLIC PANEL
Anion gap: 7 (ref 5–15)
BUN: 7 mg/dL (ref 6–20)
CO2: 22 mmol/L (ref 22–32)
Calcium: 8.6 mg/dL — ABNORMAL LOW (ref 8.9–10.3)
Chloride: 108 mmol/L (ref 98–111)
Creatinine, Ser: 0.31 mg/dL — ABNORMAL LOW (ref 0.44–1.00)
GFR calc Af Amer: >60 mL/min (ref >60)
GFR calc non Af Amer: >60 mL/min (ref >60)
Glucose, Bld: 92 mg/dL (ref 70–99)
Potassium: 3.8 mmol/L (ref 3.5–5.1)
Sodium: 137 mmol/L (ref 135–145)

## 2019-10-06 LAB — TSH: TSH: 1.392 u[IU]/mL (ref 0.350–4.500)

## 2019-10-06 MED ORDER — METOCLOPRAMIDE HCL 5 MG/ML IJ SOLN
10.0000 mg | Freq: Once | INTRAMUSCULAR | Status: AC
  Administered 2019-10-06: 10 mg via INTRAVENOUS
  Filled 2019-10-06: qty 2

## 2019-10-06 MED ORDER — SODIUM CHLORIDE 0.9 % IV BOLUS
1000.0000 mL | Freq: Once | INTRAVENOUS | Status: AC
  Administered 2019-10-06: 1000 mL via INTRAVENOUS

## 2019-10-06 NOTE — ED Notes (Signed)
Pt reports mild dizziness and body "heaviness" when changing positions for orthostatic vitals.

## 2019-10-06 NOTE — ED Notes (Signed)
Pt tolerated Sam's Cola.

## 2019-10-06 NOTE — ED Notes (Addendum)
Pt reports dizziness, paleness, abdominal cramping without vaginal bleeding, and vomiting that started one day ago. Reports waking up at 0400 feeling as if body was "heavy," with one episode of emesis at this time. Pt had school nurse check blood pressure one day ago, which was elevated. Pt states that she is "not feeling like myself." Denies being around any sick contacts, diarrhea, fevers, or chills at this time. Pt states that she normally takes Reglan for morning sickness, but has not taken it this morning.

## 2019-10-06 NOTE — ED Notes (Signed)
Pt denies nausea at this time. Dr. Scotty Court made aware. Attempting a PO challenge at this time.

## 2019-10-06 NOTE — ED Provider Notes (Signed)
[facility] Emergency Department Provider Note  ____________________________________________  Time seen: Approximately 8:48 AM  I have reviewed the triage vital signs and the nursing notes.   HISTORY  Chief Complaint Abdominal Cramping    HPI Madison Mack is a 24 y.o. 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  She saw the school nurse who had her sent home due to elevated blood pressure.  Patient reports having morning sickness to her pregnancy, currently managed with Diclegis and Reglan which is working well although she still has periods of vomiting.  She reports lightheadedness currently, denies syncope palpitations chest pain or shortness of breath.  No fevers chills body aches sweats loss of taste or smell or sick contacts.      Past Medical History:  Diagnosis Date  . Allergy   . Anxiety   . HA (headache)   . Nausea & vomiting      Patient Active Problem List   Diagnosis Date Noted  . Rh negative state in antepartum period 09/06/2019  . Supervision of normal pregnancy 08/29/2019  . Obesity affecting pregnancy in first trimester 08/29/2019  . BMI 37.0-37.9, adult 08/29/2019  . Nausea and vomiting of pregnancy, antepartum 08/22/2019  . Intractable vomiting   . Barrett's esophagus without dysplasia   . MDD (major depressive disorder), recurrent episode, mild (HCC) 09/08/2018  . Cyclic vomiting syndrome 09/08/2018  . ADD (attention deficit disorder) 09/08/2018     Past Surgical History:  Procedure Laterality Date  . ANKLE FRACTURE SURGERY Right 04/08  . ESOPHAGOGASTRODUODENOSCOPY (EGD) WITH PROPOFOL N/A 08/10/2019   Procedure: ESOPHAGOGASTRODUODENOSCOPY  (EGD) WITH PROPOFOL;  Surgeon: Pasty Spillers, MD;  Location: [facility] ENDOSCOPY;  Service: Endoscopy;  Laterality: N/A;     Prior to Admission medications   Medication Sig Start Date End Date Taking? Authorizing Provider  acetaminophen (TYLENOL) 500 MG tablet Take 500 mg by mouth every 6 (six) hours as needed.    [provider]  dimenhyDRINATE (DRAMAMINE) 50 MG tablet Take 50 mg by mouth every 8 (eight) hours as needed.    [provider]  diphenhydrAMINE (BENADRYL) 25 MG tablet Take 25 mg by mouth every 6 (six) hours as needed.    [provider]  Doxylamine-Pyridoxine (DICLEGIS) 10-10 MG TBEC Take 2 tablets by mouth at bedtime. If symptoms persist, add one tablet in the morning and one in the afternoon 08/22/19   Farrel Conners, CNM  metoCLOPramide (REGLAN) 10 MG tablet Take 1 tablet (10 mg total) by mouth 3 (three) times daily as needed for nausea or vomiting. 09/02/19   Conard Novak, MD  omeprazole (PRILOSEC) 20 MG capsule Take 20 mg by mouth daily.    [provider]  Prenatal Vit-Fe Fumarate-FA (MULTIVITAMIN-PRENATAL) 27-0.8 MG TABS tablet Take 1 tablet by mouth daily at 12 noon.    [provider]     Allergies Penicillins, Amoxicillin, and Doxycycline   Family History  Problem Relation Age of Onset  . Hyperlipidemia Father   . Hypertension Mother   . ADD / ADHD Maternal Aunt   . Alcohol abuse Maternal Aunt   . Drug abuse Maternal Aunt   . Anxiety disorder Maternal Aunt   .  Alcohol abuse Maternal Uncle   . Drug abuse Maternal Uncle   . Anxiety disorder Paternal Grandfather   . Depression Paternal Grandfather   . Dementia Paternal Grandmother     Social History Social History   Tobacco Use  . Smoking status: Never Smoker  . Smokeless tobacco: Never Used  Substance Use Topics  . Alcohol use: No    Alcohol/week: 0.0 standard drinks  . Drug use: No    Review of Systems  Constitutional:   No fever or chills.   ENT:   No sore throat. No rhinorrhea. Cardiovascular:   No chest pain or syncope. Respiratory:   No dyspnea or cough. Gastrointestinal:   Negative for abdominal pain, vomiting and diarrhea.  Musculoskeletal:   Negative for focal pain or swelling All other systems reviewed and are negative except as documented above in ROS and HPI.  ____________________________________________   PHYSICAL EXAM:  VITAL SIGNS: ED Triage Vitals [10/06/19 0750]  Enc Vitals Group     BP 128/74     Pulse Rate 91     Resp 18     Temp 98.3 F (36.8 C)     Temp Source Oral     SpO2 97 %     Weight 245 lb (111.1 kg)     Height 5\' 9"  (1.753 m)     Head Circumference      Peak Flow      Pain Score 0     Pain Loc      Pain Edu?      Excl. in GC?     Vital signs reviewed, nursing assessments reviewed.   Constitutional:   Alert and oriented. Non-toxic appearance. Eyes:   Conjunctivae are normal. EOMI. PERRL. ENT      Head:   Normocephalic and atraumatic.      Nose: Normal      Mouth/Throat:   Dry mucous membranes      Neck:   No meningismus. Full ROM. Hematological/Lymphatic/Immunilogical:   No cervical lymphadenopathy. Cardiovascular:   RRR. Symmetric bilateral radial and DP pulses.  No murmurs. Cap refill less than 2 seconds. Respiratory:   Normal respiratory effort without tachypnea/retractions. Breath sounds are clear and equal bilaterally. No wheezes/rales/rhonchi. Gastrointestinal:   Soft and nontender. Non distended. There is no CVA tenderness.  No rebound, rigidity, or guarding. Musculoskeletal:   Normal range of motion in all extremities. No joint effusions.  No lower extremity tenderness.  No edema. Neurologic:   Normal speech and language.  Motor grossly intact. No acute focal neurologic deficits are appreciated.  Skin:    Skin is warm, dry and intact. No rash noted.  No petechiae, purpura, or bullae.  ____________________________________________    LABS (pertinent  positives/negatives) (all labs ordered are listed, but only abnormal results are displayed) Labs Reviewed  BASIC METABOLIC PANEL - Abnormal; Notable for the following components:      Result Value   Creatinine, Ser 0.31 (*)    Calcium 8.6 (*)    All other components within normal limits  CBC WITH DIFFERENTIAL/PLATELET  TSH   ____________________________________________   EKG  Interpreted by me  Date: 10/06/2019  Rate: 85  Rhythm: normal sinus rhythm  QRS Axis: normal  Intervals: normal  ST/T Wave abnormalities: normal  Conduction Disutrbances: none  Narrative Interpretation: unremarkable      ____________________________________________    RADIOLOGY  No results found.  ____________________________________________   PROCEDURES Procedures  ____________________________________________  DIFFERENTIAL DIAGNOSIS   Dehydration, electrolyte abnormality, dysrhythmia, situational anxiety,  thyroid dysfunction  CLINICAL IMPRESSION / ASSESSMENT AND PLAN / ED COURSE  Medications ordered in the ED: Medications  sodium chloride 0.9 % bolus 1,000 mL (1,000 mLs Intravenous Bolus from Bag 10/06/19 0832)  metoCLOPramide (REGLAN) injection 10 mg (10 mg Intravenous Given 10/06/19 0831)    Pertinent labs & imaging results that were available during my care of the patient were reviewed by me and considered in my medical decision making (see chart for details).  Madison Mack was evaluated in Emergency Department on 10/06/2019 for the symptoms described in the history of present illness. She was evaluated in the context of the global COVID-19 pandemic, which necessitated consideration that the patient might be at risk for infection with the SARS-CoV-2 virus that causes COVID-19. Institutional protocols and algorithms that pertain to the evaluation of patients at risk for COVID-19 are in a state of rapid change based on information released by regulatory bodies including the CDC  and federal and state organizations. These policies and algorithms were followed during the patient's care in the ED.   Patient presents with fatigue since yesterday, episodes of lightheadedness.  No overt cardiac symptoms.  Vital signs are normal.  Exam is normal.  Will give IV fluids for hydration in the setting of her morning sickness, check labs.  EKG is unremarkable without signs of dysrhythmia syndrome.  ----------------------------------------- 10:15 AM on 10/06/2019 -----------------------------------------  Feeling better, nausea resolved, feels back to normal and wants to go home.  Labs are normal, vitals remain normal, stable for discharge      ____________________________________________   FINAL CLINICAL IMPRESSION(S) / ED DIAGNOSES    Final diagnoses:  First trimester pregnancy  Dehydration     ED Discharge Orders    None      Portions of this note were generated with dragon dictation software. Dictation errors may occur despite best attempts at proofreading.   Carrie Mew, MD 10/06/19 [id number]

## 2019-10-06 NOTE — ED Notes (Signed)
Assisted pt to bathroom and collected urine specimen. Urine sent to the lab.

## 2019-10-06 NOTE — ED Triage Notes (Signed)
Presents with some abd cramping and some dizziness   States she is about [redacted] weeks pregnant  Denies any vaginal bleeding   States she was sent home from work yesterday d/t elevated b/p   This am states she just doesn't feel well

## 2019-10-12 ENCOUNTER — Encounter: Payer: Self-pay | Admitting: Advanced Practice Midwife

## 2019-10-12 ENCOUNTER — Other Ambulatory Visit: Payer: Self-pay

## 2019-10-12 ENCOUNTER — Ambulatory Visit (INDEPENDENT_AMBULATORY_CARE_PROVIDER_SITE_OTHER): Payer: BC Managed Care – PPO | Admitting: Advanced Practice Midwife

## 2019-10-12 VITALS — BP 136/77 | Wt 246.0 lb

## 2019-10-12 DIAGNOSIS — Z3481 Encounter for supervision of other normal pregnancy, first trimester: Secondary | ICD-10-CM

## 2019-10-12 DIAGNOSIS — Z3480 Encounter for supervision of other normal pregnancy, unspecified trimester: Secondary | ICD-10-CM

## 2019-10-12 DIAGNOSIS — O99212 Obesity complicating pregnancy, second trimester: Secondary | ICD-10-CM

## 2019-10-12 DIAGNOSIS — Z3A12 12 weeks gestation of pregnancy: Secondary | ICD-10-CM

## 2019-10-12 NOTE — Progress Notes (Signed)
Routine Prenatal Care Visit  Subjective  Madison Mack is a 24 y.o. G2P1001 at [redacted]w[redacted]d being seen today for ongoing prenatal care.  She is currently monitored for the following issues for this low-risk pregnancy and has MDD (major depressive disorder), recurrent episode, mild (HCC); Cyclic vomiting syndrome; ADD (attention deficit disorder); Intractable vomiting; Barrett's esophagus without dysplasia; Nausea and vomiting of pregnancy, antepartum; Supervision of normal pregnancy; Obesity affecting pregnancy in first trimester; BMI 37.0-37.9, adult; and Rh negative state in antepartum period on their problem list.  ----------------------------------------------------------------------------------- Patient reports no complaints.    . Vag. Bleeding: None.  Movement: Present. Leaking Fluid denies.  ----------------------------------------------------------------------------------- The following portions of the patient's history were reviewed and updated as appropriate: allergies, current medications, past family history, past medical history, past social history, past surgical history and problem list. Problem list updated.  Objective  Blood pressure 136/77, weight 246 lb (111.6 kg), last menstrual period 07/16/2019. Pregravid weight 246 lb (111.6 kg) Total Weight Gain 0 lb (0 kg) Urinalysis: Urine Protein    Urine Glucose    Fetal Status:     Movement: Present   Unable to obtain heart tones with doppler. Abdominal u/s revealed active fetus.   General:   Alert, oriented and cooperative. Patient is in no acute distress.  Skin: Skin is warm and dry. No rash noted.   Cardiovascular: Normal heart rate noted  Respiratory: Normal respiratory effort, no problems with respiration noted  Abdomen: Soft, gravid, appropriate for gestational age.       Pelvic:  Cervical exam deferred        Extremities: Normal range of motion.     Mental Status: Normal mood and affect. Normal behavior. Normal judgment  and thought content.   Assessment   24 y.o. G2P1001 at [redacted]w[redacted]d by  04/25/2020, by 6w Ultrasound presenting for routine prenatal visit  Plan   pregnancy Problems (from 08/29/19 to present)    Problem Noted Resolved   Supervision of normal pregnancy 08/29/2019 by Conard Novak, MD No   Overview Addendum 09/06/2019  8:27 PM by Farrel Conners, CNM    Clinic [facility] Prenatal Labs  Dating 6wk6d ultrasound Blood type: A/Negative/-- (01/18 1107)   Genetic Screen 1 Screen:    AFP:     Quad:     NIPS: Antibody:Negative (01/18 1107)  Anatomic [place]  Rubella: 3.06 (01/18 1107) Varicella: Immune  GTT Early:               Third trimester:  RPR: Non Reactive (01/18 1107)   Rhogam  HBsAg: Negative (01/18 1107)   TDaP vaccine                       Flu Shot: HIV: Non Reactive (01/18 1107)   Baby Food                                GBS:   Contraception  Pap:  CBB     CS/VBAC    Support Person            Obesity affecting pregnancy in first trimester 08/29/2019 by Conard Novak, MD No   Overview Signed 08/29/2019  5:10 PM by Conard Novak, MD    [ ]  early 1h gtt      BMI 37.0-37.9, adult 08/29/2019 by 08/31/2019, MD No       Preterm labor symptoms and general obstetric precautions  including but not limited to vaginal bleeding, contractions, leaking of fluid and fetal movement were reviewed in detail with the patient. Please refer to After Visit Summary for other counseling recommendations.   Return within about 1 week (around 10/19/2019) for 1st trimester NT scan and lab visit in 1 week/rob in 4 weeks.  Rod Can, CNM 10/12/2019 4:50 PM

## 2019-10-12 NOTE — Progress Notes (Signed)
ROB

## 2019-10-12 NOTE — Patient Instructions (Signed)

## 2019-10-14 ENCOUNTER — Other Ambulatory Visit: Payer: Self-pay

## 2019-10-14 ENCOUNTER — Other Ambulatory Visit: Payer: Self-pay | Admitting: Advanced Practice Midwife

## 2019-10-14 DIAGNOSIS — Z3480 Encounter for supervision of other normal pregnancy, unspecified trimester: Secondary | ICD-10-CM

## 2019-10-19 ENCOUNTER — Other Ambulatory Visit: Payer: Self-pay

## 2019-10-19 ENCOUNTER — Other Ambulatory Visit: Payer: Self-pay | Admitting: Obstetrics and Gynecology

## 2019-10-19 ENCOUNTER — Other Ambulatory Visit: Payer: BC Managed Care – PPO

## 2019-10-19 ENCOUNTER — Ambulatory Visit (INDEPENDENT_AMBULATORY_CARE_PROVIDER_SITE_OTHER): Payer: BC Managed Care – PPO

## 2019-10-19 DIAGNOSIS — Z3A13 13 weeks gestation of pregnancy: Secondary | ICD-10-CM | POA: Diagnosis not present

## 2019-10-19 DIAGNOSIS — Z3682 Encounter for antenatal screening for nuchal translucency: Secondary | ICD-10-CM

## 2019-11-09 ENCOUNTER — Encounter: Payer: Self-pay | Admitting: Obstetrics and Gynecology

## 2019-11-09 ENCOUNTER — Ambulatory Visit (INDEPENDENT_AMBULATORY_CARE_PROVIDER_SITE_OTHER): Payer: BC Managed Care – PPO | Admitting: Obstetrics and Gynecology

## 2019-11-09 ENCOUNTER — Other Ambulatory Visit: Payer: Self-pay

## 2019-11-09 VITALS — BP 128/84 | Wt 253.0 lb

## 2019-11-09 DIAGNOSIS — Z1379 Encounter for other screening for genetic and chromosomal anomalies: Secondary | ICD-10-CM

## 2019-11-09 DIAGNOSIS — Z6837 Body mass index (BMI) 37.0-37.9, adult: Secondary | ICD-10-CM

## 2019-11-09 DIAGNOSIS — O99212 Obesity complicating pregnancy, second trimester: Secondary | ICD-10-CM

## 2019-11-09 DIAGNOSIS — Z6791 Unspecified blood type, Rh negative: Secondary | ICD-10-CM

## 2019-11-09 DIAGNOSIS — Z3482 Encounter for supervision of other normal pregnancy, second trimester: Secondary | ICD-10-CM

## 2019-11-09 DIAGNOSIS — Z3A16 16 weeks gestation of pregnancy: Secondary | ICD-10-CM

## 2019-11-09 DIAGNOSIS — O360120 Maternal care for anti-D [Rh] antibodies, second trimester, not applicable or unspecified: Secondary | ICD-10-CM

## 2019-11-09 DIAGNOSIS — O26899 Other specified pregnancy related conditions, unspecified trimester: Secondary | ICD-10-CM

## 2019-11-09 DIAGNOSIS — O09892 Supervision of other high risk pregnancies, second trimester: Secondary | ICD-10-CM

## 2019-11-09 DIAGNOSIS — O219 Vomiting of pregnancy, unspecified: Secondary | ICD-10-CM

## 2019-11-09 NOTE — Progress Notes (Signed)
Routine Prenatal Care Visit  Subjective  Madison Mack is a 24 y.o. G2P1001 at [redacted]w[redacted]d being seen today for ongoing prenatal care.  She is currently monitored for the following issues for this low-risk pregnancy and has MDD (major depressive disorder), recurrent episode, mild ([place]); Cyclic vomiting syndrome; ADD (attention deficit disorder); Intractable vomiting; Barrett's esophagus without dysplasia; Nausea and vomiting of pregnancy, antepartum; Supervision of normal pregnancy; Obesity affecting pregnancy in first trimester; BMI 37.0-37.9, adult; and Rh negative state in antepartum period on their problem list.  ----------------------------------------------------------------------------------- Patient reports return of nausea. Still has Reglan refill. Will use for now.    . Vag. Bleeding: None.  Movement: Present. Leaking Fluid denies.  ----------------------------------------------------------------------------------- The following portions of the patient's history were reviewed and updated as appropriate: allergies, current medications, past family history, past medical history, past social history, past surgical history and problem list. Problem list updated.  Objective  Blood pressure 128/84, weight 253 lb (114.8 kg), last menstrual period 07/16/2019. Pregravid weight 246 lb (111.6 kg) Total Weight Gain 7 lb (3.175 kg) Urinalysis: Urine Protein    Urine Glucose    Fetal Status: Fetal Heart Rate (bpm): 150   Movement: Present     General:  Alert, oriented and cooperative. Patient is in no acute distress.  Skin: Skin is warm and dry. No rash noted.   Cardiovascular: Normal heart rate noted  Respiratory: Normal respiratory effort, no problems with respiration noted  Abdomen: Soft, gravid, appropriate for gestational age.       Pelvic:  Cervical exam deferred        Extremities: Normal range of motion.     Mental Status: Normal mood and affect. Normal behavior. Normal judgment and  thought content.   Assessment   24 y.o. G2P1001 at [redacted]w[redacted]d by  04/25/2020, by Ultrasound presenting for routine prenatal visit  Plan   pregnancy Problems (from 08/29/19 to present)    Problem Noted Resolved   Supervision of normal pregnancy 08/29/2019 by Will Bonnet, MD No   Overview Addendum 09/06/2019  8:27 PM by Dalia Heading, [place] Prenatal Labs  Dating 6wk6d ultrasound Blood type: A/Negative/-- (01/18 1107)   Genetic Screen 1 Screen:    AFP:     Quad:     NIPS: Antibody:Negative (01/18 1107)  Anatomic [place]  Rubella: 3.06 (01/18 1107) Varicella: Immune  GTT Early:               Third trimester:  RPR: Non Reactive (01/18 1107)   Rhogam  HBsAg: Negative (01/18 1107)   TDaP vaccine                       Flu Shot: HIV: Non Reactive (01/18 1107)   Baby Food                                GBS:   Contraception  Pap:  CBB     CS/VBAC    Support Person            Obesity affecting pregnancy in first trimester 08/29/2019 by Will Bonnet, MD No   Overview Signed 08/29/2019  5:10 PM by Will Bonnet, MD    [ ]  early 1h gtt      BMI 37.0-37.9, adult 08/29/2019 by Will Bonnet, MD No       Preterm labor symptoms and general obstetric precautions including but  not limited to vaginal bleeding, contractions, leaking of fluid and fetal movement were reviewed in detail with the patient. Please refer to After Visit Summary for other counseling recommendations.   - Quad screen today  Return in about 4 weeks (around 12/07/2019) for Anatomy u/s and routine prenatal.  Thomasene Mohair, MD, [facility], [facility] 11/09/2019 4:31 PM

## 2019-11-11 LAB — AFP TETRA
DIA Mom Value: 0.66
DIA Value (EIA): 78.86 pg/mL
DSR (By Age)    1 IN: 1022
DSR (Second Trimester) 1 IN: 4377
Gestational Age: 16 WEEKS
MSAFP Mom: 0.52
MSAFP: 13.1 ng/mL
MSHCG Mom: 0.91
MSHCG: 27007 m[IU]/mL
Maternal Age At EDD: 25.2 yr
Osb Risk: 10000
T18 (By Age): 1:3981 {titer}
Test Results:: NEGATIVE
Weight: 253 [lb_av]
uE3 Mom: 0.74
uE3 Value: 0.62 ng/mL

## 2019-11-25 ENCOUNTER — Emergency Department
Admission: EM | Admit: 2019-11-25 | Discharge: 2019-11-25 | Disposition: A | Discharge status: Home / Self Care | Payer: BC Managed Care – PPO | Attending: Emergency Medicine | Admitting: Emergency Medicine

## 2019-11-25 ENCOUNTER — Other Ambulatory Visit: Payer: Self-pay

## 2019-11-25 ENCOUNTER — Encounter: Payer: Self-pay | Admitting: Emergency Medicine

## 2019-11-25 DIAGNOSIS — Z20822 Contact with and (suspected) exposure to covid-19: Secondary | ICD-10-CM | POA: Diagnosis not present

## 2019-11-25 DIAGNOSIS — O219 Vomiting of pregnancy, unspecified: Secondary | ICD-10-CM | POA: Insufficient documentation

## 2019-11-25 DIAGNOSIS — Z3A18 18 weeks gestation of pregnancy: Secondary | ICD-10-CM | POA: Insufficient documentation

## 2019-11-25 DIAGNOSIS — Z79899 Other long term (current) drug therapy: Secondary | ICD-10-CM | POA: Insufficient documentation

## 2019-11-25 LAB — COMPREHENSIVE METABOLIC PANEL WITH GFR
ALT: 14 U/L (ref 0–44)
AST: 13 U/L — ABNORMAL LOW (ref 15–41)
Albumin: 3.3 g/dL — ABNORMAL LOW (ref 3.5–5.0)
Alkaline Phosphatase: 60 U/L (ref 38–126)
Anion gap: 9 (ref 5–15)
BUN: 7 mg/dL (ref 6–20)
CO2: 21 mmol/L — ABNORMAL LOW (ref 22–32)
Calcium: 8.8 mg/dL — ABNORMAL LOW (ref 8.9–10.3)
Chloride: 108 mmol/L (ref 98–111)
Creatinine, Ser: 0.5 mg/dL (ref 0.44–1.00)
GFR calc Af Amer: >60 mL/min (ref >60)
GFR calc non Af Amer: >60 mL/min (ref >60)
Glucose, Bld: 104 mg/dL — ABNORMAL HIGH (ref 70–99)
Potassium: 3.9 mmol/L (ref 3.5–5.1)
Sodium: 138 mmol/L (ref 135–145)
Total Bilirubin: 0.7 mg/dL (ref 0.3–1.2)
Total Protein: 7.2 g/dL (ref 6.5–8.1)

## 2019-11-25 LAB — POCT PREGNANCY, URINE: Preg Test, Ur: POSITIVE — AB

## 2019-11-25 LAB — CBC
HCT: 37.8 % (ref 36.0–46.0)
Hemoglobin: 12.6 g/dL (ref 12.0–15.0)
MCH: 29.4 pg (ref 26.0–34.0)
MCHC: 33.3 g/dL (ref 30.0–36.0)
MCV: 88.3 fL (ref 80.0–100.0)
Platelets: 211 10*3/uL (ref 150–400)
RBC: 4.28 MIL/uL (ref 3.87–5.11)
RDW: 13.7 % (ref 11.5–15.5)
WBC: 10.6 10*3/uL — ABNORMAL HIGH (ref 4.0–10.5)
nRBC: 0 % (ref 0.0–0.2)

## 2019-11-25 LAB — URINALYSIS, COMPLETE (UACMP) WITH MICROSCOPIC
Bilirubin Urine: NEGATIVE
Glucose, UA: NEGATIVE mg/dL
Hgb urine dipstick: NEGATIVE
Ketones, ur: 5 mg/dL — AB
Leukocytes,Ua: NEGATIVE
Nitrite: NEGATIVE
Protein, ur: 30 mg/dL — AB
Specific Gravity, Urine: 1.023 (ref 1.005–1.030)
pH: 7 (ref 5.0–8.0)

## 2019-11-25 LAB — LIPASE, BLOOD: Lipase: 27 U/L (ref 11–51)

## 2019-11-25 LAB — SARS CORONAVIRUS 2 (TAT 6-24 HRS): SARS Coronavirus 2: NEGATIVE

## 2019-11-25 MED ORDER — DEXTROSE-NACL 5-0.45 % IV SOLN: Freq: Once | INTRAVENOUS | Status: AC

## 2019-11-25 MED ORDER — DIPHENHYDRAMINE HCL 50 MG/ML IJ SOLN
50.0000 mg | Freq: Once | INTRAMUSCULAR | Status: AC
  Administered 2019-11-25: 09:00:00 50 mg via INTRAVENOUS
  Filled 2019-11-25: qty 1

## 2019-11-25 MED ORDER — SODIUM CHLORIDE 0.9 % IV BOLUS: 1000.0000 mL | Freq: Once | INTRAVENOUS | Status: DC

## 2019-11-25 MED ORDER — METOCLOPRAMIDE HCL 5 MG/ML IJ SOLN
10.0000 mg | Freq: Once | INTRAMUSCULAR | Status: AC
  Administered 2019-11-25: 10 mg via INTRAVENOUS
  Filled 2019-11-25: qty 2

## 2019-11-25 NOTE — ED Provider Notes (Signed)
Madison Mack, Madison [facility] Department Provider Note  ____________________________________________  Time seen: Approximately 8:20 AM  I have reviewed the triage vital signs and the nursing notes.   HISTORY  Chief Complaint Emesis    HPI Madison Mack is a 24 y.o. female currently about [redacted] weeks pregnant, experiencing nausea and vomiting throughout first trimester who comes in today complaining of worsened nausea and vomiting for the past 24 hours, having emesis about every hour with difficulty taking p.o.  Denies postprandial symptoms recently.  No significant blood in the emesis.  Associated diarrhea that is not black or bloody.  No body aches or fever but she does report some headache as well.  No known Covid exposure, not vaccinated yet.  Symptoms are intermittent, waxing waning, no aggravating or alleviating factors.  She has tried Diclegis Reglan and Zofran at home without relief.  Denies any contractions, vaginal bleeding, leakage of fluid.   Denies dysuria frequency urgency.  Nursing reports that they were unable to find fetal heart tones in triage.      Past Medical History:  Diagnosis Date  . Allergy   . Anxiety   . HA (headache)   . Nausea & vomiting      Patient Active Problem List   Diagnosis Date Noted  . Rh negative state in antepartum period 09/06/2019  . Supervision of normal pregnancy 08/29/2019  . Obesity affecting pregnancy in first trimester 08/29/2019  . BMI 37.0-37.9, adult 08/29/2019  . Nausea and vomiting of pregnancy, antepartum 08/22/2019  . Intractable vomiting   . Barrett's esophagus without dysplasia   . MDD (major depressive disorder), recurrent episode, mild (HCC) 09/08/2018  . Cyclic vomiting syndrome 09/08/2018  . ADD (attention deficit disorder) 09/08/2018     Past Surgical History:  Procedure Laterality Date  . ANKLE FRACTURE SURGERY Right 04/08  . ESOPHAGOGASTRODUODENOSCOPY (EGD) WITH PROPOFOL N/A  08/10/2019   Procedure: ESOPHAGOGASTRODUODENOSCOPY (EGD) WITH PROPOFOL;  Surgeon: Pasty Spillers, MD;  Location: [facility] ENDOSCOPY;  Service: Endoscopy;  Laterality: N/A;     Prior to Admission medications   Medication Sig Start Date End Date Taking? Authorizing Provider  acetaminophen (TYLENOL) 500 MG tablet Take 500 mg by mouth every 6 (six) hours as needed.    [provider]  dimenhyDRINATE (DRAMAMINE) 50 MG tablet Take 50 mg by mouth every 8 (eight) hours as needed.    [provider]  diphenhydrAMINE (BENADRYL) 25 MG tablet Take 25 mg by mouth every 6 (six) hours as needed.    [provider]  Doxylamine-Pyridoxine (DICLEGIS) 10-10 MG TBEC Take 2 tablets by mouth at bedtime. If symptoms persist, add one tablet in the morning and one in the afternoon 08/22/19   Farrel Conners, CNM  metoCLOPramide (REGLAN) 10 MG tablet Take 1 tablet (10 mg total) by mouth 3 (three) times daily as needed for nausea or vomiting. 09/02/19   Conard Novak, MD  omeprazole (PRILOSEC) 20 MG capsule Take 20 mg by mouth daily.    [provider]  Prenatal Vit-Fe Fumarate-FA (MULTIVITAMIN-PRENATAL) 27-0.8 MG TABS tablet Take 1 tablet by mouth daily at 12 noon.    [provider]     Allergies Penicillins, Amoxicillin, and Doxycycline   Family History  Problem Relation Age of Onset  . Hyperlipidemia Father   . Hypertension Mother   . ADD / ADHD Maternal Aunt   . Alcohol abuse Maternal Aunt   . Drug abuse Maternal Aunt   . Anxiety disorder Maternal Aunt   .  Alcohol abuse Maternal Uncle   . Drug abuse Maternal Uncle   . Anxiety disorder Paternal Grandfather   . Depression Paternal Grandfather   . Dementia Paternal Grandmother     Social History Social History   Tobacco Use  . Smoking status: Never Smoker  . Smokeless tobacco: Never Used  Substance Use Topics  . Alcohol use: No    Alcohol/week: 0.0 standard drinks  . Drug use: No    Review  of Systems  Constitutional:   No fever or chills.  ENT:   No sore throat. No rhinorrhea. Cardiovascular:   No chest pain or syncope. Respiratory:   No dyspnea or cough. Gastrointestinal: Positive vague abdominal pain and vomiting and diarrhea Musculoskeletal:   Negative for focal pain or swelling All other systems reviewed and are negative except as documented above in ROS and HPI.  ____________________________________________   PHYSICAL EXAM:  VITAL SIGNS: ED Triage Vitals [11/25/19 0711]  Enc Vitals Group     BP 129/79     Pulse Rate 79     Resp 20     Temp 99.1 F (37.3 C)     Temp Source Oral     SpO2 99 %     Weight 250 lb (113.4 kg)     Height 5\' 9"  (1.753 m)     Head Circumference      Peak Flow      Pain Score 0     Pain Loc      Pain Edu?      Excl. in [place]?     Vital signs reviewed, nursing assessments reviewed.   Constitutional:   Alert and oriented. Non-toxic appearance. Eyes:   Conjunctivae are normal. EOMI. PERRL. ENT      Head:   Normocephalic and atraumatic.      Nose:   Wearing a mask.      Mouth/Throat:   Wearing a mask.      Neck:   No meningismus. Full ROM. Hematological/Lymphatic/Immunilogical:   No cervical lymphadenopathy. Cardiovascular:   RRR. Symmetric bilateral radial and DP pulses.  No murmurs. Cap refill less than 2 seconds. Respiratory:   Normal respiratory effort without tachypnea/retractions. Breath sounds are clear and equal bilaterally. No wheezes/rales/rhonchi. Gastrointestinal:   Soft without focal tenderness.  Gravid, size consistent with dates.  There is no CVA tenderness.  No rebound, rigidity, or guarding. Musculoskeletal:   Normal range of motion in all extremities. No joint effusions.  No lower extremity tenderness.  No edema. Neurologic:   Normal speech and language.  Motor grossly intact. No acute focal neurologic deficits are appreciated.  Skin:    Skin is warm, dry and intact. No rash noted.  No petechiae, purpura, or  bullae.  Bedside ultrasound performed by me, visualizing fetus and measuring heart rate of 135 beats a minute.  Gross fetal movements observed.  ____________________________________________    LABS (pertinent positives/negatives) (all labs ordered are listed, but only abnormal results are displayed) Labs Reviewed  COMPREHENSIVE METABOLIC PANEL - Abnormal; Notable for the following components:      Result Value   CO2 21 (*)    Glucose, Bld 104 (*)    Calcium 8.8 (*)    Albumin 3.3 (*)    AST 13 (*)    All other components within normal limits  CBC - Abnormal; Notable for the following components:   WBC 10.6 (*)    All other components within normal limits  URINALYSIS, COMPLETE (UACMP) WITH MICROSCOPIC - Abnormal; Notable  for the following components:   Color, Urine YELLOW (*)    APPearance HAZY (*)    Ketones, ur 5 (*)    Protein, ur 30 (*)    Bacteria, UA RARE (*)    All other components within normal limits  POCT PREGNANCY, URINE - Abnormal; Notable for the following components:   Preg Test, Ur POSITIVE (*)    All other components within normal limits  SARS CORONAVIRUS 2 (TAT 6-24 HRS)  LIPASE, BLOOD   ____________________________________________   EKG    ____________________________________________    RADIOLOGY  No results found.  ____________________________________________   PROCEDURES Procedures  ____________________________________________    CLINICAL IMPRESSION / ASSESSMENT AND PLAN / ED COURSE  Medications ordered in the ED: Medications  sodium chloride 0.9 % bolus 1,000 mL (has no administration in time range)  dextrose 5 %-0.45 % sodium chloride infusion ( Intravenous New Bag/Given 11/25/19 0841)  metoCLOPramide (REGLAN) injection 10 mg (10 mg Intravenous Given 11/25/19 0840)  diphenhydrAMINE (BENADRYL) injection 50 mg (50 mg Intravenous Given 11/25/19 0840)    Pertinent labs & imaging results that were available during my care of the patient  were reviewed by me and considered in my medical decision making (see chart for details).  Madison Mack was evaluated in Emergency Department on 11/25/2019 for the symptoms described in the history of present illness. She was evaluated in the context of the global COVID-19 pandemic, which necessitated consideration that the patient might be at risk for infection with the SARS-CoV-2 virus that causes COVID-19. Institutional protocols and algorithms that pertain to the evaluation of patients at risk for COVID-19 are in a state of rapid change based on information released by regulatory bodies including the CDC and federal and state organizations. These policies and algorithms were followed during the patient's care in the ED.   Patient presents with nausea vomiting of pregnancy.  She is nontoxic, vital signs are normal, exam is reassuring. Considering the patient's symptoms, medical history, and physical examination today, I have low suspicion for cholecystitis or biliary pathology, pancreatitis, perforation or bowel obstruction, hernia, intra-abdominal abscess, AAA or dissection, volvulus or intussusception, mesenteric ischemia, or appendicitis.  I will give IV fluids starting with D5 half-normal for hydration and reversal of starvation metabolism, IV Reglan and Benadryl for symptom relief.  Clinical Course as of Nov 25 951  Fri Nov 25, 2019  7619 Patient feels much better, ready to go home.  Ambulatory, vitals remain normal.  Stable for discharge.   [PS]    Clinical Course User Index [PS] Sharman Cheek, MD     ____________________________________________   FINAL CLINICAL IMPRESSION(S) / ED DIAGNOSES    Final diagnoses:  Nausea and vomiting during pregnancy prior to [redacted] weeks gestation     ED Discharge Orders    None      Portions of this note were generated with dragon dictation software. Dictation errors may occur despite best attempts at proofreading.   Sharman Cheek, MD 11/25/19 [phone]

## 2019-11-25 NOTE — ED Triage Notes (Signed)
Pt reports is [redacted] weeks pregnant and has also has trouble with vomiting but yesterday the vomiting got worse. Pt reports she has vomited every hour.

## 2019-12-07 ENCOUNTER — Ambulatory Visit (INDEPENDENT_AMBULATORY_CARE_PROVIDER_SITE_OTHER): Payer: BC Managed Care – PPO

## 2019-12-07 ENCOUNTER — Ambulatory Visit (INDEPENDENT_AMBULATORY_CARE_PROVIDER_SITE_OTHER): Payer: BC Managed Care – PPO | Admitting: Obstetrics & Gynecology

## 2019-12-07 ENCOUNTER — Other Ambulatory Visit: Payer: Self-pay

## 2019-12-07 ENCOUNTER — Encounter: Payer: Self-pay | Admitting: Obstetrics & Gynecology

## 2019-12-07 VITALS — BP 120/80 | Wt 251.0 lb

## 2019-12-07 DIAGNOSIS — Z3482 Encounter for supervision of other normal pregnancy, second trimester: Secondary | ICD-10-CM

## 2019-12-07 DIAGNOSIS — Z3A19 19 weeks gestation of pregnancy: Secondary | ICD-10-CM

## 2019-12-07 DIAGNOSIS — Z3689 Encounter for other specified antenatal screening: Secondary | ICD-10-CM

## 2019-12-07 DIAGNOSIS — Z3A20 20 weeks gestation of pregnancy: Secondary | ICD-10-CM

## 2019-12-07 MED ORDER — ONDANSETRON 4 MG PO TBDP: 4.0000 mg | ORAL_TABLET | Freq: Four times a day (QID) | ORAL | 0 refills | Status: DC | PRN

## 2019-12-07 MED ORDER — BONJESTA 20-20 MG PO TBCR: 1.0000 | EXTENDED_RELEASE_TABLET | Freq: Two times a day (BID) | ORAL | 3 refills | Status: DC

## 2019-12-07 NOTE — Patient Instructions (Signed)

## 2019-12-07 NOTE — Progress Notes (Signed)
  Subjective  Fetal Movement? yes Contractions? no Leaking Fluid? no Vaginal Bleeding? no  Objective  BP 120/80   Wt 251 lb (113.9 kg)   LMP 07/16/2019 (Exact Date)   BMI 37.07 kg/m  General: NAD Pumonary: no increased work of breathing Abdomen: gravid, non-tender Extremities: no edema Psychiatric: mood appropriate, affect full  Assessment  24 y.o. G2P1001 at [redacted]w[redacted]d by  04/25/2020, by Ultrasound presenting for routine prenatal visit  Plan   Problem List Items Addressed This Visit      Other   Supervision of normal pregnancy    Other Visit Diagnoses    [redacted] weeks gestation of pregnancy    -  Primary   Screening, antenatal, for fetal anatomic survey       Relevant Orders   US OB Follow Up      pregnancy Problems (from 08/29/19 to present)    Problem Noted Resolved   Supervision of normal pregnancy 08/29/2019 by Conard Novak, MD No   Overview Addendum 09/06/2019  8:27 PM by Farrel Conners, CNM    Clinic [facility] Prenatal Labs  Dating 6wk6d ultrasound Blood type: A/Negative/-- (01/18 1107)   Genetic Screen 1 Screen:    AFP:     Quad:     NIPS: Antibody:Negative (01/18 1107)  Anatomic [place] [facility], needs f/u Rubella: 3.06 (01/18 1107) Varicella: Immune  GTT Early:118 Third trimester:  RPR: Non Reactive (01/18 1107)   Rhogam needed HBsAg: Negative (01/18 1107)   TDaP vaccine              Flu Shot: HIV: Non Reactive (01/18 1107)   Baby Food                                GBS:   Contraception  Pap:  CBB  No   CS/VBAC N/A   Support Person Husband           Obesity affecting pregnancy in first trimester 08/29/2019 by Conard Novak, MD No   Overview Signed 08/29/2019  5:10 PM by Conard Novak, MD    [ ]  early 1h gtt      BMI 37.0-37.9, adult 08/29/2019 by 08/31/2019, MD No     PNV  Nausea meds adjusted again, still having daily problems w n/v  Review of ULTRASOUND. I have personally reviewed images and report of recent ultrasound done at  [facility]. There is a singleton gestation with subjectively normal amniotic fluid volume. The fetal biometry correlates with established dating. Detailed evaluation of the fetal anatomy was performed.The fetal anatomical survey appears within normal limits within the resolution of ultrasound as described above.  It must be noted that a normal ultrasound is unable to rule out fetal aneuploidy.   Needs f/u for certain missed parts nv  [facility], MD, [facility], [facility] 12/07/2019  4:49 PM

## 2019-12-08 ENCOUNTER — Other Ambulatory Visit: Payer: Self-pay | Admitting: Advanced Practice Midwife

## 2019-12-08 ENCOUNTER — Telehealth: Payer: Self-pay

## 2019-12-08 DIAGNOSIS — O219 Vomiting of pregnancy, unspecified: Secondary | ICD-10-CM

## 2019-12-08 MED ORDER — DOXYLAMINE-PYRIDOXINE 10-10 MG PO TBEC: 2.0000 | DELAYED_RELEASE_TABLET | Freq: Every day | ORAL | 5 refills | Status: DC

## 2019-12-08 NOTE — Progress Notes (Unsigned)
Bonjesta d/c'ed and ordered diclegis.

## 2019-12-08 NOTE — Telephone Encounter (Signed)
Can you send it to someone who has a little less patient or page the on call

## 2019-12-08 NOTE — Telephone Encounter (Signed)
Ordered diclegis for patient. Can you let her know. Thanks.

## 2019-12-08 NOTE — Telephone Encounter (Signed)
Pt states the [facility] Tiburcio Pea prescribed yesterday is 200 a month, Can she switch back to diclegis, its cheaper. Can you send in since [facility] is not in the office

## 2019-12-09 NOTE — Telephone Encounter (Signed)
Pt aware.

## 2019-12-22 ENCOUNTER — Other Ambulatory Visit: Payer: Self-pay | Admitting: Advanced Practice Midwife

## 2019-12-22 ENCOUNTER — Telehealth: Payer: Self-pay

## 2019-12-22 DIAGNOSIS — O219 Vomiting of pregnancy, unspecified: Secondary | ICD-10-CM

## 2019-12-22 MED ORDER — ONDANSETRON 4 MG PO TBDP: 4.0000 mg | ORAL_TABLET | Freq: Four times a day (QID) | ORAL | 3 refills | Status: DC | PRN

## 2019-12-22 NOTE — Progress Notes (Unsigned)
Refill zofran sent to patient pharmacy.

## 2019-12-22 NOTE — Telephone Encounter (Signed)
Please let her know that I reordered her zofran with 2 refills available for her. Last Rx from [facility] had no refills in the order.

## 2019-12-22 NOTE — Telephone Encounter (Signed)
Patient aware.

## 2019-12-22 NOTE — Telephone Encounter (Signed)
Patient has been taking the Diclegis and the zofran together and it has been working well. Her zofran rx was for #18. She states she can take it more than once per day which she has been doing. When she doesn't take it, she's constantly sick and unable to work. She has refills, but they said it's too soon for her to fill it. Inquiring if directions can be changed so that she can get her refill.

## 2020-01-04 ENCOUNTER — Ambulatory Visit (INDEPENDENT_AMBULATORY_CARE_PROVIDER_SITE_OTHER): Payer: BC Managed Care – PPO

## 2020-01-04 ENCOUNTER — Ambulatory Visit (INDEPENDENT_AMBULATORY_CARE_PROVIDER_SITE_OTHER): Payer: BC Managed Care – PPO | Admitting: Obstetrics and Gynecology

## 2020-01-04 ENCOUNTER — Other Ambulatory Visit: Payer: Self-pay

## 2020-01-04 ENCOUNTER — Encounter: Payer: Self-pay | Admitting: Obstetrics and Gynecology

## 2020-01-04 VITALS — BP 122/84 | Wt 251.0 lb

## 2020-01-04 DIAGNOSIS — Z3A24 24 weeks gestation of pregnancy: Secondary | ICD-10-CM

## 2020-01-04 DIAGNOSIS — Z131 Encounter for screening for diabetes mellitus: Secondary | ICD-10-CM

## 2020-01-04 DIAGNOSIS — O99212 Obesity complicating pregnancy, second trimester: Secondary | ICD-10-CM

## 2020-01-04 DIAGNOSIS — Z3689 Encounter for other specified antenatal screening: Secondary | ICD-10-CM | POA: Diagnosis not present

## 2020-01-04 DIAGNOSIS — Z6791 Unspecified blood type, Rh negative: Secondary | ICD-10-CM

## 2020-01-04 DIAGNOSIS — O0992 Supervision of high risk pregnancy, unspecified, second trimester: Secondary | ICD-10-CM

## 2020-01-04 DIAGNOSIS — O26899 Other specified pregnancy related conditions, unspecified trimester: Secondary | ICD-10-CM

## 2020-01-04 DIAGNOSIS — Z113 Encounter for screening for infections with a predominantly sexual mode of transmission: Secondary | ICD-10-CM

## 2020-01-04 NOTE — Progress Notes (Signed)
Routine Prenatal Care Visit  Subjective  Madison Mack is a 24 y.o. G2P1001 at [redacted]w[redacted]d being seen today for ongoing prenatal care.  She is currently monitored for the following issues for this high-risk pregnancy and has MDD (major depressive disorder), recurrent episode, mild (HCC); Cyclic vomiting syndrome; ADD (attention deficit disorder); Intractable vomiting; Barrett's esophagus without dysplasia; Nausea and vomiting of pregnancy, antepartum; Supervision of high risk pregnancy, antepartum; Obesity affecting pregnancy in first trimester; BMI 37.0-37.9, adult; and Rh negative state in antepartum period on their problem list.  ----------------------------------------------------------------------------------- Patient reports no complaints.    . Vag. Bleeding: None.  Movement: Present. Leaking Fluid denies.  U/S: anatomy scan now complete ----------------------------------------------------------------------------------- The following portions of the patient's history were reviewed and updated as appropriate: allergies, current medications, past family history, past medical history, past social history, past surgical history and problem list. Problem list updated.  Objective  Blood pressure 122/84, weight 251 lb (113.9 kg), last menstrual period 07/16/2019. Pregravid weight 246 lb (111.6 kg) Total Weight Gain 5 lb (2.268 kg) Urinalysis: Urine Protein    Urine Glucose    Fetal Status: Fetal Heart Rate (bpm): 147   Movement: Present     General:  Alert, oriented and cooperative. Patient is in no acute distress.  Skin: Skin is warm and dry. No rash noted.   Cardiovascular: Normal heart rate noted  Respiratory: Normal respiratory effort, no problems with respiration noted  Abdomen: Soft, gravid, appropriate for gestational age. Pain/Pressure: Present     Pelvic:  Cervical exam deferred        Extremities: Normal range of motion.  Edema: None  Mental Status: Normal mood and affect. Normal  behavior. Normal judgment and thought content.   Assessment   24 y.o. G2P1001 at [redacted]w[redacted]d by  04/25/2020, by Ultrasound presenting for routine prenatal visit  Plan   pregnancy Problems (from 08/29/19 to present)    Problem Noted Resolved   Supervision of high risk pregnancy, antepartum 08/29/2019 by Conard Novak, MD No   Overview Addendum 12/07/2019  4:51 PM by Nadara Mustard, MD    Clinic [facility] Prenatal Labs  Dating 6wk6d ultrasound Blood type: A/Negative/-- (01/18 1107)   Genetic Screen 1 Screen:  Unable to get NT Antibody:Negative (01/18 1107)  Anatomic [place] [facility], needs f/u images Rubella: 3.06 (01/18 1107) Varicella: Immune  GTT Early:   118  Third trimester:  RPR: Non Reactive (01/18 1107)   Rhogam A-, so needed HBsAg: Negative (01/18 1107)   TDaP vaccine                       Flu Shot: HIV: Non Reactive (01/18 1107)   Baby Food                                GBS:   Contraception  Pap:08/2019  CBB  No   CS/VBAC N/A   Support Person Husband           Obesity affecting pregnancy in first trimester 08/29/2019 by Conard Novak, MD No   Overview Signed 08/29/2019  5:10 PM by Conard Novak, MD    [ ]  early 1h gtt      BMI 37.0-37.9, adult 08/29/2019 by 08/31/2019, MD No       Preterm labor symptoms and general obstetric precautions including but not limited to vaginal bleeding, contractions, leaking of fluid and fetal movement were  reviewed in detail with the patient. Please refer to After Visit Summary for other counseling recommendations.   Return in about 4 weeks (around 02/01/2020) for 28 week labs and routine prenatal.  Prentice Docker, MD, [place] 01/04/2020 4:34 PM

## 2020-01-15 ENCOUNTER — Emergency Department: Payer: BC Managed Care – PPO

## 2020-01-15 ENCOUNTER — Emergency Department
Admission: EM | Admit: 2020-01-15 | Discharge: 2020-01-16 | Disposition: A | Discharge status: Home / Self Care | Payer: BC Managed Care – PPO | Attending: Emergency Medicine | Admitting: Emergency Medicine

## 2020-01-15 ENCOUNTER — Other Ambulatory Visit: Payer: Self-pay

## 2020-01-15 DIAGNOSIS — O26892 Other specified pregnancy related conditions, second trimester: Secondary | ICD-10-CM | POA: Diagnosis present

## 2020-01-15 DIAGNOSIS — R05 Cough: Secondary | ICD-10-CM | POA: Insufficient documentation

## 2020-01-15 DIAGNOSIS — Z20822 Contact with and (suspected) exposure to covid-19: Secondary | ICD-10-CM | POA: Insufficient documentation

## 2020-01-15 DIAGNOSIS — R0789 Other chest pain: Secondary | ICD-10-CM | POA: Insufficient documentation

## 2020-01-15 DIAGNOSIS — Z3A25 25 weeks gestation of pregnancy: Secondary | ICD-10-CM | POA: Insufficient documentation

## 2020-01-15 DIAGNOSIS — R0600 Dyspnea, unspecified: Secondary | ICD-10-CM

## 2020-01-15 DIAGNOSIS — R071 Chest pain on breathing: Secondary | ICD-10-CM | POA: Diagnosis not present

## 2020-01-15 DIAGNOSIS — R059 Cough, unspecified: Secondary | ICD-10-CM

## 2020-01-15 LAB — BASIC METABOLIC PANEL
Anion gap: 9 (ref 5–15)
BUN: 7 mg/dL (ref 6–20)
CO2: 20 mmol/L — ABNORMAL LOW (ref 22–32)
Calcium: 9.1 mg/dL (ref 8.9–10.3)
Chloride: 108 mmol/L (ref 98–111)
Creatinine, Ser: 0.62 mg/dL (ref 0.44–1.00)
GFR calc Af Amer: >60 mL/min (ref >60)
GFR calc non Af Amer: >60 mL/min (ref >60)
Glucose, Bld: 85 mg/dL (ref 70–99)
Potassium: 3.8 mmol/L (ref 3.5–5.1)
Sodium: 137 mmol/L (ref 135–145)

## 2020-01-15 LAB — SARS CORONAVIRUS 2 BY RT PCR (HOSPITAL ORDER, PERFORMED IN ~~LOC~~ HOSPITAL LAB): SARS Coronavirus 2: NEGATIVE

## 2020-01-15 LAB — CBC
HCT: 37 % (ref 36.0–46.0)
Hemoglobin: 12.5 g/dL (ref 12.0–15.0)
MCH: 29.5 pg (ref 26.0–34.0)
MCHC: 33.8 g/dL (ref 30.0–36.0)
MCV: 87.3 fL (ref 80.0–100.0)
Platelets: 213 10*3/uL (ref 150–400)
RBC: 4.24 MIL/uL (ref 3.87–5.11)
RDW: 14.3 % (ref 11.5–15.5)
WBC: 12.1 10*3/uL — ABNORMAL HIGH (ref 4.0–10.5)
nRBC: 0 % (ref 0.0–0.2)

## 2020-01-15 LAB — FIBRIN DERIVATIVES D-DIMER (ARMC ONLY): Fibrin derivatives D-dimer (ARMC): 661.34 ng/mL (FEU) — ABNORMAL HIGH (ref 0.00–499.00)

## 2020-01-15 LAB — BRAIN NATRIURETIC PEPTIDE: B Natriuretic Peptide: 44.4 pg/mL (ref 0.0–100.0)

## 2020-01-15 LAB — TROPONIN I (HIGH SENSITIVITY): Troponin I (High Sensitivity): <2 ng/L (ref <18)

## 2020-01-15 MED ORDER — IOHEXOL 350 MG/ML SOLN
100.0000 mL | Freq: Once | INTRAVENOUS | Status: AC | PRN
  Administered 2020-01-15: 100 mL via INTRAVENOUS

## 2020-01-15 MED ORDER — ALBUTEROL SULFATE (2.5 MG/3ML) 0.083% IN NEBU
2.5000 mg | INHALATION_SOLUTION | Freq: Once | RESPIRATORY_TRACT | Status: AC
  Administered 2020-01-15: 2.5 mg via RESPIRATORY_TRACT
  Filled 2020-01-15: qty 3

## 2020-01-15 NOTE — ED Triage Notes (Signed)
Pt c/o of SOB, CP, rib pain from coughing. States symptoms began Thursday. A&O, ambulatory. Breathing fast in triage. Appears anxious. Denies fever. Denies sick contacts.   25 weeks, 4 days pregnant. Sees [facility]. Denies vaginal bleeding, discharge, or cramping.

## 2020-01-15 NOTE — ED Notes (Signed)
Pt reports improved shob after nebulizer.

## 2020-01-15 NOTE — ED Provider Notes (Addendum)
[facility] Emergency Department Provider Note   ____________________________________________   First MD Initiated Contact with Patient 01/15/20 1948     (approximate)  I have reviewed the triage vital signs and the nursing notes.   HISTORY  Chief Complaint Shortness of Breath and Cough    HPI Madison Mack is a 24 y.o. female who has had a dry nonproductive cough for several days.  She is having pain in her chest when she coughs and also pain with inspiration.  She is pregnant.  She has never had anything like this before.  She does not have a history of asthma.  Pain when she coughs or takes of breath is moderately severe.         Past Medical History:  Diagnosis Date  . Allergy   . Anxiety   . HA (headache)   . Nausea & vomiting     Patient Active Problem List   Diagnosis Date Noted  . Rh negative state in antepartum period 09/06/2019  . Supervision of high risk pregnancy, antepartum 08/29/2019  . Obesity affecting pregnancy in first trimester 08/29/2019  . BMI 37.0-37.9, adult 08/29/2019  . Nausea and vomiting of pregnancy, antepartum 08/22/2019  . Intractable vomiting   . Barrett's esophagus without dysplasia   . MDD (major depressive disorder), recurrent episode, mild (HCC) 09/08/2018  . Cyclic vomiting syndrome 09/08/2018  . ADD (attention deficit disorder) 09/08/2018    Past Surgical History:  Procedure Laterality Date  . ANKLE FRACTURE SURGERY Right 04/08  . ESOPHAGOGASTRODUODENOSCOPY (EGD) WITH PROPOFOL N/A 08/10/2019   Procedure: ESOPHAGOGASTRODUODENOSCOPY (EGD) WITH PROPOFOL;  Surgeon: Pasty Spillers, MD;  Location: [facility] ENDOSCOPY;  Service: Endoscopy;  Laterality: N/A;    Prior to Admission medications   Medication Sig Start Date End Date Taking? Authorizing Provider  acetaminophen (TYLENOL) 500 MG tablet Take 500 mg by mouth every 6 (six) hours as needed.   Yes [provider]  dimenhyDRINATE  (DRAMAMINE) 50 MG tablet Take 50 mg by mouth every 8 (eight) hours as needed.   Yes [provider]  diphenhydrAMINE (BENADRYL) 25 MG tablet Take 25 mg by mouth every 6 (six) hours as needed.   Yes [provider]  Doxylamine-Pyridoxine (DICLEGIS) 10-10 MG TBEC Take 2 tablets by mouth at bedtime. If symptoms persist, add one tablet in the morning and one in the afternoon 12/08/19  Yes Tresea Mall, CNM  omeprazole (PRILOSEC) 20 MG capsule Take 20 mg by mouth daily.   Yes [provider]  Prenatal Vit-Fe Fumarate-FA (MULTIVITAMIN-PRENATAL) 27-0.8 MG TABS tablet Take 1 tablet by mouth daily at 12 noon.   Yes [provider]    Allergies Penicillins, Amoxicillin, and Doxycycline  Family History  Problem Relation Age of Onset  . Hyperlipidemia Father   . Hypertension Mother   . ADD / ADHD Maternal Aunt   . Alcohol abuse Maternal Aunt   . Drug abuse Maternal Aunt   . Anxiety disorder Maternal Aunt   . Alcohol abuse Maternal Uncle   . Drug abuse Maternal Uncle   . Anxiety disorder Paternal Grandfather   . Depression Paternal Grandfather   . Dementia Paternal Grandmother     Social History Social History   Tobacco Use  . Smoking status: Never Smoker  . Smokeless tobacco: Never Used  Substance Use Topics  . Alcohol use: No    Alcohol/week: 0.0 standard drinks  . Drug use: No    Review of Systems  Constitutional: No fever/chills Eyes:  No visual changes. ENT: No sore throat. Cardiovascular:  chest pain. Respiratory shortness of breath. Gastrointestinal: No abdominal pain.  No nausea, no vomiting.  No diarrhea.  No constipation. Genitourinary: Negative for dysuria. Musculoskeletal: Negative for back pain. Skin: Negative for rash. Neurological: Negative for headaches, focal weakness   ____________________________________________   PHYSICAL EXAM:  VITAL SIGNS: ED Triage Vitals  Enc Vitals Group     BP 01/15/20 1837 130/64     Pulse  Rate 01/15/20 1837 91     Resp 01/15/20 1837 (!) 26     Temp 01/15/20 1837 98.2 F (36.8 C)     Temp Source 01/15/20 1837 Oral     SpO2 01/15/20 1837 99 %     Weight 01/15/20 1841 250 lb (113.4 kg)     Height 01/15/20 1841 5\' 8"  (1.727 m)     Head Circumference --      Peak Flow --      Pain Score 01/15/20 1841 7     Pain Loc --      Pain Edu? --      Excl. in [place]? --     Constitutional: Alert and oriented.  Looks uncomfortable especially when she breathes in or coughs Eyes: Conjunctivae are normal.  Head: Atraumatic. Nose: No congestion/rhinnorhea. Mouth/Throat: Mucous membranes are moist.  Oropharynx non-erythematous. Neck: No stridor.   Cardiovascular: Normal rate, regular rhythm. Grossly normal heart sounds.  Good peripheral circulation. Respiratory: Normal respiratory effort.  No retractions. Lungs occasional wheezes Gastrointestinal: Soft and nontender. No distention. No abdominal bruits. No CVA tenderness. Musculoskeletal: No lower extremity tenderness nor edema.  Neurologic:  Normal speech and language. No gross focal neurologic deficits are appreciated.  Skin:  Skin is warm, dry and intact. No rash noted.   ____________________________________________   LABS (all labs ordered are listed, but only abnormal results are displayed)  Labs Reviewed  BASIC METABOLIC PANEL - Abnormal; Notable for the following components:      Result Value   CO2 20 (*)    All other components within normal limits  CBC - Abnormal; Notable for the following components:   WBC 12.1 (*)    All other components within normal limits  FIBRIN DERIVATIVES D-DIMER ([facility] ONLY) - Abnormal; Notable for the following components:   Fibrin derivatives D-dimer ([facility]) 661.34 (*)    All other components within normal limits  SARS CORONAVIRUS 2 BY RT PCR (HOSPITAL ORDER, [place] [facility])  BRAIN NATRIURETIC PEPTIDE  DIFFERENTIAL  CBC  TROPONIN I (HIGH SENSITIVITY)  TROPONIN I (HIGH  SENSITIVITY)   ____________________________________________  EKG EKG read interpreted by me shows normal sinus rhythm rate of 98 normal axis essentially normal Cardiac monitor read and interpreted by me shows mostly normal sinus rhythm in the 90s.  Patient will call into the low 100s. ____________________________________________  RADIOLOGY  ED MD interpretation: Chest x-ray read by radiology and reviewed by me is negative chest CT read by radiology is negative  Official radiology report(s): DG Chest 2 View  Result Date: 01/15/2020 CLINICAL DATA:  Chest pain, cough, shortness of breath. Pregnant patient at [redacted] weeks gestation. EXAM: CHEST - 2 VIEW COMPARISON:  Chest radiograph 01/15/2014 FINDINGS: The cardiomediastinal contours are normal. The lungs are clear. Pulmonary vasculature is normal. No consolidation, pleural effusion, or pneumothorax. No acute osseous abnormalities are seen. IMPRESSION: Negative radiographs of the chest. Electronically Signed   By: Keith Rake M.D.   On: 01/15/2020 19:13   CT Angio Chest PE W  and/or Wo Contrast  Result Date: 01/15/2020 CLINICAL DATA:  Shortness of breath and chest pain. EXAM: CT ANGIOGRAPHY CHEST WITH CONTRAST TECHNIQUE: Multidetector CT imaging of the chest was performed using the standard protocol during bolus administration of intravenous contrast. Multiplanar CT image reconstructions and MIPs were obtained to evaluate the vascular anatomy. CONTRAST:  OMNIPAQUE IOHEXOL 350 MG/ML SOLN COMPARISON:  None. FINDINGS: Cardiovascular: The subsegmental pulmonary arteries are limited in evaluation secondary to suboptimal opacification with intravenous contrast. No intraluminal filling defects are identified. Normal heart size. No pericardial effusion. Mediastinum/Nodes: No enlarged mediastinal, hilar, or axillary lymph nodes. Thyroid gland, trachea, and esophagus demonstrate no significant findings. Lungs/Pleura: Lungs are clear. No pleural effusion or  pneumothorax. Upper Abdomen: There is a small hiatal hernia. Musculoskeletal: No chest wall abnormality. No acute or significant osseous findings. Review of the MIP images confirms the above findings. IMPRESSION: 1. No CT evidence of pulmonary embolism or other acute intrathoracic process. 2. Small hiatal hernia. Electronically Signed   By: Aram Candela M.D.   On: 01/15/2020 22:08    ____________________________________________   PROCEDURES  Procedure(s) performed (including Critical Care):  Procedures   ____________________________________________   INITIAL IMPRESSION / ASSESSMENT AND PLAN / ED COURSE  Patient with pleuritic chest pain coughing subjective shortness of breath borderline tachycardia.  Her D-dimer is elevated.  This is not unexpected in pregnancy but not helpful.  Chest x-ray is clear.  Discussed with patient risk factors possible increased cancer risk and the baby with CT.  I should add that while this is based on her Clydie Braun survivor studies there are studies in [place] which show an increase survivability in animals with low-dose radiation.  In any rate I discussed all this with the patient and we will go ahead and CT her. ----------------------------------------- 11:23 PM on 01/15/2020 -----------------------------------------  CT is negative.  Patient has gotten 1 albuterol and is almost completely clear.  We will give her 1 more for the road and then anticipate discharge.             ____________________________________________   FINAL CLINICAL IMPRESSION(S) / ED DIAGNOSES  Final diagnoses:  Cough  Dyspnea, unspecified type     ED Discharge Orders    None       Note:  This document was prepared using Dragon voice recognition software and may include unintentional dictation errors.    Arnaldo Natal, MD 01/15/20 0947    Arnaldo Natal, MD 01/16/20 Pernell Dupre    Arnaldo Natal, MD 01/16/20 [phone]

## 2020-01-15 NOTE — ED Notes (Signed)
Pt up to commode 

## 2020-01-15 NOTE — ED Notes (Signed)
Pt to ct scan.

## 2020-01-16 MED ORDER — ALBUTEROL SULFATE (2.5 MG/3ML) 0.083% IN NEBU
2.5000 mg | INHALATION_SOLUTION | Freq: Once | RESPIRATORY_TRACT | Status: AC
  Administered 2020-01-16: 2.5 mg via RESPIRATORY_TRACT
  Filled 2020-01-16: qty 3

## 2020-01-16 MED ORDER — ALBUTEROL SULFATE HFA 108 (90 BASE) MCG/ACT IN AERS
2.0000 | INHALATION_SPRAY | Freq: Four times a day (QID) | RESPIRATORY_TRACT | 0 refills | Status: DC | PRN
Start: 2020-01-16 — End: 2020-02-23

## 2020-01-16 NOTE — Discharge Instructions (Signed)
Please use the albuterol inhaler 2 puffs 4 times a day as we discussed.  Return here if you are worse or if you get a fever and especially for worse shortness of breath.  Please see [facility] tomorrow.

## 2020-01-25 ENCOUNTER — Other Ambulatory Visit: Payer: Self-pay

## 2020-01-25 ENCOUNTER — Observation Stay
Admission: EM | Admit: 2020-01-25 | Discharge: 2020-01-25 | Disposition: A | Discharge status: Home / Self Care | Payer: BC Managed Care – PPO | Attending: Certified Nurse Midwife | Admitting: Certified Nurse Midwife

## 2020-01-25 ENCOUNTER — Encounter: Payer: Self-pay | Admitting: Obstetrics & Gynecology

## 2020-01-25 ENCOUNTER — Ambulatory Visit (INDEPENDENT_AMBULATORY_CARE_PROVIDER_SITE_OTHER): Payer: BC Managed Care – PPO | Admitting: Obstetrics

## 2020-01-25 VITALS — BP 126/74 | Wt 250.0 lb

## 2020-01-25 DIAGNOSIS — O219 Vomiting of pregnancy, unspecified: Principal | ICD-10-CM | POA: Insufficient documentation

## 2020-01-25 DIAGNOSIS — E860 Dehydration: Secondary | ICD-10-CM | POA: Diagnosis not present

## 2020-01-25 DIAGNOSIS — Z3A27 27 weeks gestation of pregnancy: Secondary | ICD-10-CM

## 2020-01-25 DIAGNOSIS — O99212 Obesity complicating pregnancy, second trimester: Secondary | ICD-10-CM | POA: Insufficient documentation

## 2020-01-25 DIAGNOSIS — Z79899 Other long term (current) drug therapy: Secondary | ICD-10-CM | POA: Diagnosis not present

## 2020-01-25 DIAGNOSIS — O99211 Obesity complicating pregnancy, first trimester: Secondary | ICD-10-CM

## 2020-01-25 DIAGNOSIS — O26892 Other specified pregnancy related conditions, second trimester: Secondary | ICD-10-CM | POA: Insufficient documentation

## 2020-01-25 DIAGNOSIS — Z6837 Body mass index (BMI) 37.0-37.9, adult: Secondary | ICD-10-CM

## 2020-01-25 DIAGNOSIS — E669 Obesity, unspecified: Secondary | ICD-10-CM | POA: Insufficient documentation

## 2020-01-25 DIAGNOSIS — O0992 Supervision of high risk pregnancy, unspecified, second trimester: Secondary | ICD-10-CM

## 2020-01-25 DIAGNOSIS — O0990 Supervision of high risk pregnancy, unspecified, unspecified trimester: Secondary | ICD-10-CM

## 2020-01-25 LAB — URINALYSIS, COMPLETE (UACMP) WITH MICROSCOPIC
Bacteria, UA: NONE SEEN
Bilirubin Urine: NEGATIVE
Glucose, UA: NEGATIVE mg/dL
Hgb urine dipstick: NEGATIVE
Ketones, ur: 80 mg/dL — AB
Nitrite: NEGATIVE
Protein, ur: 30 mg/dL — AB
Specific Gravity, Urine: 1.024 (ref 1.005–1.030)
pH: 6 (ref 5.0–8.0)

## 2020-01-25 LAB — CBC
HCT: 36.4 % (ref 36.0–46.0)
Hemoglobin: 12.2 g/dL (ref 12.0–15.0)
MCH: 29.5 pg (ref 26.0–34.0)
MCHC: 33.5 g/dL (ref 30.0–36.0)
MCV: 87.9 fL (ref 80.0–100.0)
Platelets: 243 10*3/uL (ref 150–400)
RBC: 4.14 MIL/uL (ref 3.87–5.11)
RDW: 14.3 % (ref 11.5–15.5)
WBC: 8.3 10*3/uL (ref 4.0–10.5)
nRBC: 0 % (ref 0.0–0.2)

## 2020-01-25 LAB — COMPREHENSIVE METABOLIC PANEL
ALT: 28 U/L (ref 0–44)
AST: 25 U/L (ref 15–41)
Albumin: 3.2 g/dL — ABNORMAL LOW (ref 3.5–5.0)
Alkaline Phosphatase: 82 U/L (ref 38–126)
Anion gap: 10 (ref 5–15)
BUN: 8 mg/dL (ref 6–20)
CO2: 21 mmol/L — ABNORMAL LOW (ref 22–32)
Calcium: 8.6 mg/dL — ABNORMAL LOW (ref 8.9–10.3)
Chloride: 105 mmol/L (ref 98–111)
Creatinine, Ser: 0.48 mg/dL (ref 0.44–1.00)
GFR calc Af Amer: >60 mL/min (ref >60)
GFR calc non Af Amer: >60 mL/min (ref >60)
Glucose, Bld: 76 mg/dL (ref 70–99)
Potassium: 3.7 mmol/L (ref 3.5–5.1)
Sodium: 136 mmol/L (ref 135–145)
Total Bilirubin: 1.3 mg/dL — ABNORMAL HIGH (ref 0.3–1.2)
Total Protein: 7 g/dL (ref 6.5–8.1)

## 2020-01-25 LAB — MAGNESIUM: Magnesium: 2 mg/dL (ref 1.7–2.4)

## 2020-01-25 MED ORDER — PROMETHAZINE HCL 25 MG/ML IJ SOLN
12.5000 mg | Freq: Four times a day (QID) | INTRAMUSCULAR | Status: DC | PRN
  Administered 2020-01-25: 12.5 mg via INTRAVENOUS
  Filled 2020-01-25: qty 1

## 2020-01-25 MED ORDER — ONDANSETRON HCL 4 MG/2ML IJ SOLN
4.0000 mg | INTRAMUSCULAR | Status: DC | PRN
  Administered 2020-01-25: 4 mg via INTRAVENOUS
  Filled 2020-01-25: qty 2

## 2020-01-25 MED ORDER — FAMOTIDINE 20 MG PO TABS
20.0000 mg | ORAL_TABLET | Freq: Two times a day (BID) | ORAL | Status: DC
  Administered 2020-01-25: 20 mg via ORAL
  Filled 2020-01-25: qty 1

## 2020-01-25 MED ORDER — PROMETHAZINE HCL 12.5 MG PO TABS: 12.5000 mg | ORAL_TABLET | Freq: Four times a day (QID) | ORAL | 1 refills | Status: DC | PRN

## 2020-01-25 MED ORDER — LACTATED RINGERS IV SOLN: INTRAVENOUS | Status: DC

## 2020-01-25 MED ORDER — LACTATED RINGERS IV BOLUS
1000.0000 mL | Freq: Once | INTRAVENOUS | Status: AC
  Administered 2020-01-25: 1000 mL via INTRAVENOUS

## 2020-01-25 NOTE — Progress Notes (Signed)
Pt. Has been able to keep crackers, peanut butter and fluids down. Order for discharge received. Iv removed and pt discharged to home.

## 2020-01-25 NOTE — Progress Notes (Signed)
Routine Prenatal Care Visit  Subjective  Madison Mack is a 24 y.o. G2P1001 at [redacted]w[redacted]d being seen today for ongoing prenatal care.  She is currently monitored for the following issues for this high-risk pregnancy and has MDD (major depressive disorder), recurrent episode, mild (HCC); Cyclic vomiting syndrome; ADD (attention deficit disorder); Intractable vomiting; Barrett's esophagus without dysplasia; Nausea and vomiting of pregnancy, antepartum; Supervision of high risk pregnancy, antepartum; Obesity affecting pregnancy in first trimester; BMI 37.0-37.9, adult; and Rh negative state in antepartum period on their problem list.  ----------------------------------------------------------------------------------- Patient reports fatigue, nausea, vomiting and weakness .  She has been unable to keep any food down for over 24 hours. Feels dehydrated.She has continued on her diclegis, but does not think it is working well. She has also used the Zofran. 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 She has a hx of situational anxiety/depression for which she took Prozac. She stoppe the Fluoxetine when she wanted to conceive. She denies any severed depressive sxs today.  . Vag. Bleeding: None.  Movement: Present. Leaking Fluid denies.  ----------------------------------------------------------------------------------- The following portions of the patient's history were reviewed and updated as appropriate: allergies, current medications, past family history, past medical history, past social history, past surgical history and problem list. Problem list updated.  Objective  Blood pressure 126/74, weight 250 lb (113.4 kg), last menstrual period 07/16/2019. Pregravid weight 246 lb (111.6 kg) Total Weight Gain 4 lb (1.814 kg) Urinalysis: Urine Protein    Urine Glucose    Fetal Status:     Movement: Present     General:  Alert, oriented and cooperative. Patient is in no acute distress.    Skin: Skin is warm and dry. No rash noted.   Cardiovascular: Normal heart rate noted  Respiratory: Normal respiratory effort, no problems with respiration noted  Abdomen: Soft, gravid, appropriate for gestational age. Pain/Pressure: Absent     Pelvic:  Cervical exam deferred        Extremities: Normal range of motion.     Mental Status: Normal mood and affect. Normal behavior. Normal judgment and thought content.   Assessment   24 y.o. G2P1001 at [redacted]w[redacted]d by  04/25/2020, by Ultrasound presenting for work-in prenatal visit for nausea and vomiting not well controlled  With her previous regimen.  Urine shows Ketones- large  Plan   pregnancy Problems (from 08/29/19 to present)    Problem Noted Resolved   Supervision of high risk pregnancy, antepartum 08/29/2019 by Conard Novak, MD No   Overview Addendum 01/04/2020  4:35 PM by Conard Novak, MD    Clinic [facility] Prenatal Labs  Dating 6wk6d ultrasound Blood type: A/Negative/-- (01/18 1107)   Genetic Screen 1 Screen:  Unable to get NT Antibody:Negative (01/18 1107)  Anatomic [place] [facility], 5/26 complete Rubella: 3.06 (01/18 1107) Varicella: Immune  GTT Early:   118  Third trimester:  RPR: Non Reactive (01/18 1107)   Rhogam A-, so needed HBsAg: Negative (01/18 1107)   TDaP vaccine                       Flu Shot: HIV: Non Reactive (01/18 1107)   Baby Food                                GBS:   Contraception  Pap:08/2019  CBB  No   CS/VBAC N/A   Support Person Husband  Previous Version   Obesity affecting pregnancy in first trimester 08/29/2019 by Will Bonnet, MD No   Overview Signed 08/29/2019  5:10 PM by Will Bonnet, MD    [ ]  early 1h gtt      BMI 37.0-37.9, adult 08/29/2019 by Will Bonnet, MD No       Preterm labor symptoms and general obstetric precautions including but not limited to vaginal bleeding, contractions, leaking of fluid and fetal movement were reviewed in detail with the  patient.  She is sent over to Labor and Delivery for IV fluid hydration, and antiemetics. Evert Kohl CNM notified and report given regarding patient's imminent arrival. Please refer to After Visit Summary for other counseling recommendations.   Return for already has next appointment set up.Imagene Riches, CNM  01/25/2020 11:41 AM

## 2020-01-25 NOTE — Final Progress Note (Signed)
Physician Final Progress Note  Patient ID: Madison Mack MRN: [id number] DOB/AGE: April 01, 1996 24 y.o.  Admit date: 01/25/2020 Admitting provider: Renae Fickle Harris/ Claire Bridge L. Sharen Hones, CNM Discharge date: 01/25/2020   Admission Diagnoses: IUP at 27 weeks with nausea and vomiting  Discharge Diagnoses:  IUP at 27 weeks with nausea and vomiting R/O acute gastroenteritis.  Consults: None  Significant Findings/ Diagnostic Studies:    History of Present Illness:  Chief Complaint:  "Have not been able to keep down any food or fluids since last night. Feeling weak." HPI:  Madison Mack is a 24 y.o. G35P1001 female with EDC=04/25/2020 at [redacted]w[redacted]d dated by a 6wk6d ultrasound.  Her pregnancy has been complicated by obesity (current BMI 38 kg/m2) nausea and vomiting of pregnancy, and a hx of depression/anxiety (not currently on medications). She has had problems with nausea and vomiting and epigastric pain prior to this pregnancy and was undergoing an evaluation by GI, when she became pregnant. Has a past medical history of cyclical vomiting syndrome and Barrett's esophagus.   She presents to L&D for treatment of nausea and vomiting/ dehydration. Reports that she has had decreased appetite since 6/14 PM after eating supper that she cooked at home (chicken stir fry). Started with the nausea and vomiting yesterday. Was able to keep down a fruit cup and a banana and mayo sandwich last night for supper. Reports vomiting about 10 times in the last 24 hours and 4 times since this AM. Has been able to drink some water, but usually vomits 30-40 minutes later. Usually takes Diclegis, Zofran and omeprazole. Last took Diclegis today and the Zofran last night.  She also reports being seen in the ER 6/6 for SOB and chest pain when she coughed. She had a negative Covid test and a neg CT and CXR, but she did have a small hiatal hernia. She improved with Albuterol treatments. Her son had similar symptoms and was later  diagnosed with RSV   ROS: Denies fever, diarrhea, constipation, regular contractions, vaginal bleeding, dysuria. Baby is active..   Prenatal care site: Prenatal care at [facility] has been remarkable for  [facility] Prenatal Labs  Dating 6wk6d ultrasound Blood type: A/Negative/-- (01/18 1107)   Genetic Screen 1 Screen:  Unable to get NT Antibody:Negative (01/18 1107)  Anatomic [place] [facility], 5/26 complete Rubella: 3.06 (01/18 1107) Varicella: Immune  GTT Early:   118  Third trimester:  RPR: Non Reactive (01/18 1107)   Rhogam A-, so needed HBsAg: Negative (01/18 1107)   TDaP vaccine                       Flu Shot: HIV: Non Reactive (01/18 1107)   Baby Food                                GBS:   Contraception  Pap:08/2019  CBB  No   CS/VBAC N/A   Support Person Husband           Maternal Medical History:   Past Medical History:  Diagnosis Date  . Allergy   . Anxiety   . HA (headache)   . Nausea & vomiting     Past Surgical History:  Procedure Laterality Date  . ANKLE FRACTURE SURGERY Right 04/08  . ESOPHAGOGASTRODUODENOSCOPY (EGD) WITH PROPOFOL N/A 08/10/2019   Procedure: ESOPHAGOGASTRODUODENOSCOPY (EGD) WITH PROPOFOL;  Surgeon: Pasty Spillers, MD;  Location: [facility] ENDOSCOPY;  Service:  Endoscopy;  Laterality: N/A;   OB History  Gravida Para Term Preterm AB Living  2 1 1     1   SAB TAB Ectopic Multiple Live Births          1    # Outcome Date GA Lbr Len/2nd Weight Sex Delivery Anes PTL Lv  2 Current           1 Term 10/23/16 [redacted]w[redacted]d  3572 g M Vag-Spont  N LIV   Allergies  Allergen Reactions  . Penicillins Rash    Has patient had a PCN reaction causing immediate rash, facial/tongue/throat swelling, SOB or lightheadedness with hypotension: SWELLING FEET AND HANDS, HIVES Has patient had a PCN reaction causing severe rash involving mucus membranes or skin necrosis: NO Has patient had a PCN reaction that required hospitalization: NO Has patient had a PCN reaction  occurring within the last 10 years: NO If all of the above answers are "NO", then may proceed with Cephalosporin use.   Marland Kitchen Amoxicillin   . Doxycycline Hives    Prior to Admission medications   Medication Sig Start Date End Date Taking? Authorizing Provider  albuterol (VENTOLIN HFA) 108 (90 Base) MCG/ACT inhaler Inhale 2 puffs into the lungs every 6 (six) hours as needed for wheezing or shortness of breath. 01/16/20  Yes Nena Polio, MD  Doxylamine-Pyridoxine (DICLEGIS) 10-10 MG TBEC Take 2 tablets by mouth at bedtime. If symptoms persist, add one tablet in the morning and one in the afternoon 12/08/19  Yes Rod Can, CNM  omeprazole (PRILOSEC) 20 MG capsule Take 20 mg by mouth daily.   Yes [provider]  ondansetron (ZOFRAN-ODT) 4 MG disintegrating tablet Take 4 mg by mouth every 8 (eight) hours as needed for nausea or vomiting.   Yes [provider]  Prenatal Vit-Fe Fumarate-FA (MULTIVITAMIN-PRENATAL) 27-0.8 MG TABS tablet Take 1 tablet by mouth daily at 12 noon.   Yes [provider]  acetaminophen (TYLENOL) 500 MG tablet Take 500 mg by mouth every 6 (six) hours as needed. Patient not taking: Reported on 01/25/2020    [provider]  dimenhyDRINATE (DRAMAMINE) 50 MG tablet Take 50 mg by mouth every 8 (eight) hours as needed. Patient not taking: Reported on 01/25/2020    [provider]  diphenhydrAMINE (BENADRYL) 25 MG tablet Take 25 mg by mouth every 6 (six) hours as needed. Patient not taking: Reported on 01/25/2020    [provider]  promethazine (PHENERGAN) 12.5 MG tablet Take 1 tablet (12.5 mg total) by mouth every 6 (six) hours as needed for nausea or vomiting. 01/25/20   Dalia Heading, CNM     Social History: She  reports that she has never smoked. She has never used smokeless tobacco. She reports that she does not drink alcohol and does not use drugs.  Family History: family history includes ADD / ADHD in her maternal  aunt; Alcohol abuse in her maternal aunt and maternal uncle; Anxiety disorder in her maternal aunt and paternal grandfather; Dementia in her paternal grandmother; Depression in her paternal grandfather; Drug abuse in her maternal aunt and maternal uncle; Hyperlipidemia in her father; Hypertension in her mother.   Review of Systems: Review of Systems  Constitutional: Negative for chills and fever.  Respiratory: Negative for cough.   Gastrointestinal: Positive for nausea and vomiting. Negative for abdominal pain, constipation, diarrhea and heartburn.  Genitourinary: Negative for dysuria.       Denies vaginal bleeding or leakage of water.  Musculoskeletal: Positive for  back pain.  Skin: Negative for rash.      Physical Exam:  Vital Signs: BP (!) 116/56 (BP Location: Left Arm)   Pulse 92   Temp 98.3 F (36.8 C) (Oral)   Resp 16   Ht 5\' 8"  (1.727 m)   Wt 113.4 kg   LMP 07/16/2019 (Exact Date)   BMI 38.01 kg/m  General: WF in no acute distress.  HEENT: normocephalic, atraumatic Heart: regular rate & rhythm.  No murmurs/rubs/gallops Lungs: clear to auscultation bilaterally, normal respiratory effort Abdomen: gravid, soft and non tender uterus. Mild epigastric tenderness to palpation. NABS Extremities: non-tender, symmetric, no edema bilaterally.   Neurologic: Alert & oriented x 3.   Baseline FHR: 140 baseline with accelerations to 150s to 160s, moderate variability Toco: no contractions seen   Bedside Ultrasound: breech presentation  Results for orders placed or performed during the hospital encounter of 01/25/20 (from the past 24 hour(s))  CBC     Status: None   Collection Time: 01/25/20  1:23 PM  Result Value Ref Range   WBC 8.3 4.0 - 10.5 K/uL   RBC 4.14 3.87 - 5.11 MIL/uL   Hemoglobin 12.2 12.0 - 15.0 g/dL   HCT 01/27/20 36 - 46 %   MCV 87.9 80.0 - 100.0 fL   MCH 29.5 26.0 - 34.0 pg   MCHC 33.5 30.0 - 36.0 g/dL   RDW 29.4 76.5 - 46.5 %   Platelets 243 150 - 400 K/uL   nRBC  0.0 0.0 - 0.2 %  Comprehensive metabolic panel     Status: Abnormal   Collection Time: 01/25/20  1:23 PM  Result Value Ref Range   Sodium 136 135 - 145 mmol/L   Potassium 3.7 3.5 - 5.1 mmol/L   Chloride 105 98 - 111 mmol/L   CO2 21 (L) 22 - 32 mmol/L   Glucose, Bld 76 70 - 99 mg/dL   BUN 8 6 - 20 mg/dL   Creatinine, Ser 01/27/20 0.44 - 1.00 mg/dL   Calcium 8.6 (L) 8.9 - 10.3 mg/dL   Total Protein 7.0 6.5 - 8.1 g/dL   Albumin 3.2 (L) 3.5 - 5.0 g/dL   AST 25 15 - 41 U/L   ALT 28 0 - 44 U/L   Alkaline Phosphatase 82 38 - 126 U/L   Total Bilirubin 1.3 (H) 0.3 - 1.2 mg/dL   GFR calc non Af Amer >60 >60 mL/min   GFR calc Af Amer >60 >60 mL/min   Anion gap 10 5 - 15  Magnesium     Status: None   Collection Time: 01/25/20  1:23 PM  Result Value Ref Range   Magnesium 2.0 1.7 - 2.4 mg/dL  Urinalysis, Complete w Microscopic     Status: Abnormal   Collection Time: 01/25/20  1:23 PM  Result Value Ref Range   Color, Urine AMBER (A) YELLOW   APPearance HAZY (A) CLEAR   Specific Gravity, Urine 1.024 1.005 - 1.030   pH 6.0 5.0 - 8.0   Glucose, UA NEGATIVE NEGATIVE mg/dL   Hgb urine dipstick NEGATIVE NEGATIVE   Bilirubin Urine NEGATIVE NEGATIVE   Ketones, ur 80 (A) NEGATIVE mg/dL   Protein, ur 30 (A) NEGATIVE mg/dL   Nitrite NEGATIVE NEGATIVE   Leukocytes,Ua SMALL (A) NEGATIVE   RBC / HPF 0-5 0 - 5 RBC/hpf   WBC, UA 11-20 0 - 5 WBC/hpf   Bacteria, UA NONE SEEN NONE SEEN   Squamous Epithelial / LPF 6-10 0 - 5  Mucus PRESENT     Hospital Course: Received IV hydration and initially was given Zofran 4 mgm IV. Her nausea persisted, although she had no further vomiting. She was given phenergan 12.5 mgm IV as well as Pepcid 20 mgm po which did resolve her nausea. She was able to keep down ginger ale and peanut butter crackers and some applesauce, after which she requested discharge.  Her IV was discontinued and she was discharged home on a BRAT diet.   Procedures: none  Discharge Condition:  stable  Disposition: Discharge disposition: 01-Home or Self Care       Diet: BRAT/bland diet  Discharge Activity: Activity as tolerated  Discharge Instructions    Discharge patient   Complete by: As directed    Discharge disposition: 01-Home or Self Care   Discharge patient date: 01/25/2020     Allergies as of 01/25/2020      Reactions   Penicillins Rash   Has patient had a PCN reaction causing immediate rash, facial/tongue/throat swelling, SOB or lightheadedness with hypotension: SWELLING FEET AND HANDS, HIVES Has patient had a PCN reaction causing severe rash involving mucus membranes or skin necrosis: NO Has patient had a PCN reaction that required hospitalization: NO Has patient had a PCN reaction occurring within the last 10 years: NO If all of the above answers are "NO", then may proceed with Cephalosporin use.   Amoxicillin    Doxycycline Hives      Medication List    STOP taking these medications   dimenhyDRINATE 50 MG tablet Commonly known as: DRAMAMINE   Doxylamine-Pyridoxine 10-10 MG Tbec Commonly known as: Diclegis   ondansetron 4 MG disintegrating tablet Commonly known as: ZOFRAN-ODT     TAKE these medications   acetaminophen 500 MG tablet Commonly known as: TYLENOL Take 500 mg by mouth every 6 (six) hours as needed.   albuterol 108 (90 Base) MCG/ACT inhaler Commonly known as: VENTOLIN HFA Inhale 2 puffs into the lungs every 6 (six) hours as needed for wheezing or shortness of breath.   diphenhydrAMINE 25 MG tablet Commonly known as: BENADRYL Take 25 mg by mouth every 6 (six) hours as needed.   multivitamin-prenatal 27-0.8 MG Tabs tablet Take 1 tablet by mouth daily at 12 noon.   omeprazole 20 MG capsule Commonly known as: PRILOSEC Take 20 mg by mouth daily.   promethazine 12.5 MG tablet Commonly known as: PHENERGAN Take 1 tablet (12.5 mg total) by mouth every 6 (six) hours as needed for nausea or vomiting.        Total time spent  taking care of this patient: 30 minutes  Signed: Farrel Conners 01/25/2020, 5:56 PM

## 2020-01-25 NOTE — Discharge Instructions (Signed)
Bland Diet A bland diet consists of foods that are often soft and do not have a lot of fat, fiber, or extra seasonings. Foods without fat, fiber, or seasoning are easier for the body to digest. They are also less likely to irritate your mouth, throat, stomach, and other parts of your digestive system. A bland diet is sometimes called a BRAT diet. What is my plan? Your health care provider or food and nutrition specialist (dietitian) may recommend specific changes to your diet to prevent symptoms or to treat your symptoms. These changes may include:  Eating small meals often.  Cooking food until it is soft enough to chew easily.  Chewing your food well.  Drinking fluids slowly.  Not eating foods that are very spicy, sour, or fatty.  Not eating citrus fruits, such as oranges and grapefruit. What do I need to know about this diet?  Eat a variety of foods from the bland diet food list.  Do not follow a bland diet longer than needed.  Ask your health care provider whether you should take vitamins or supplements. What foods can I eat? Grains  Hot cereals, such as cream of wheat. Rice. Bread, crackers, or tortillas made from refined white flour. Vegetables Canned or cooked vegetables. Mashed or boiled potatoes. Fruits  Bananas. Applesauce. Other types of cooked or canned fruit with the skin and seeds removed, such as canned peaches or pears. Meats and other proteins  Scrambled eggs. Creamy peanut butter or other nut butters. Lean, well-cooked meats, such as chicken or fish. Tofu. Soups or broths. Dairy Low-fat dairy products, such as milk, cottage cheese, or yogurt. Beverages  Water. Herbal tea. Apple juice. Fats and oils Mild salad dressings. Canola or olive oil. Sweets and desserts Pudding. Custard. Fruit gelatin. Ice cream. The items listed above may not be a complete list of recommended foods and beverages. Contact a dietitian for more options. What foods are not  recommended? Grains Whole grain breads and cereals. Vegetables Raw vegetables. Fruits Raw fruits, especially citrus, berries, or dried fruits. Dairy Whole fat dairy foods. Beverages Caffeinated drinks. Alcohol. Seasonings and condiments Strongly flavored seasonings or condiments. Hot sauce. Salsa. Other foods Spicy foods. Fried foods. Sour foods, such as pickled or fermented foods. Foods with high sugar content. Foods high in fiber. The items listed above may not be a complete list of foods and beverages to avoid. Contact a dietitian for more information. Summary  A bland diet consists of foods that are often soft and do not have a lot of fat, fiber, or extra seasonings.  Foods without fat, fiber, or seasoning are easier for the body to digest.  Check with your health care provider to see how long you should follow this diet plan. It is not meant to be followed for long periods. This information is not intended to replace advice given to you by your health care provider. Make sure you discuss any questions you have with your health care provider. Document Revised: 08/26/2017 Document Reviewed: 08/26/2017 Elsevier Patient Education  2020 Elsevier Inc.  

## 2020-01-25 NOTE — OB Triage Note (Signed)
Patient G2P1 [redacted]w[redacted]d complains of n/v since Monday night. She states she has been sick since then and does not know of anything she ate that could have upset her stomach. She states in the last 24 hours she has gotten sick 10 times. She states she has had braxton hicks for a couple of days and they are uncomfortable when they come. She reports last BM 01/24/20. Patient denies vaginal bleeding and discharge. Patient reports + FM. Vital signs stable and monitors applied and assessing.

## 2020-01-25 NOTE — Progress Notes (Signed)
A lot of nausea and vomitting. No appetite. Feels dehydrated

## 2020-01-26 LAB — URINE CULTURE: Special Requests: NORMAL

## 2020-02-01 ENCOUNTER — Ambulatory Visit (INDEPENDENT_AMBULATORY_CARE_PROVIDER_SITE_OTHER): Payer: BC Managed Care – PPO | Admitting: Certified Nurse Midwife

## 2020-02-01 ENCOUNTER — Other Ambulatory Visit: Payer: Self-pay

## 2020-02-01 ENCOUNTER — Other Ambulatory Visit: Payer: BC Managed Care – PPO

## 2020-02-01 VITALS — BP 114/70 | Ht 68.0 in | Wt 250.0 lb

## 2020-02-01 DIAGNOSIS — O26899 Other specified pregnancy related conditions, unspecified trimester: Secondary | ICD-10-CM

## 2020-02-01 DIAGNOSIS — Z3A28 28 weeks gestation of pregnancy: Secondary | ICD-10-CM | POA: Diagnosis not present

## 2020-02-01 DIAGNOSIS — O26843 Uterine size-date discrepancy, third trimester: Secondary | ICD-10-CM

## 2020-02-01 DIAGNOSIS — O1213 Gestational proteinuria, third trimester: Secondary | ICD-10-CM

## 2020-02-01 DIAGNOSIS — O0993 Supervision of high risk pregnancy, unspecified, third trimester: Secondary | ICD-10-CM

## 2020-02-01 DIAGNOSIS — O219 Vomiting of pregnancy, unspecified: Secondary | ICD-10-CM

## 2020-02-01 DIAGNOSIS — O0992 Supervision of high risk pregnancy, unspecified, second trimester: Secondary | ICD-10-CM

## 2020-02-01 DIAGNOSIS — Z6791 Unspecified blood type, Rh negative: Secondary | ICD-10-CM

## 2020-02-01 DIAGNOSIS — Z113 Encounter for screening for infections with a predominantly sexual mode of transmission: Secondary | ICD-10-CM

## 2020-02-01 DIAGNOSIS — O0990 Supervision of high risk pregnancy, unspecified, unspecified trimester: Secondary | ICD-10-CM

## 2020-02-01 DIAGNOSIS — Z131 Encounter for screening for diabetes mellitus: Secondary | ICD-10-CM

## 2020-02-01 LAB — POCT URINALYSIS DIPSTICK OB: Glucose, UA: NEGATIVE

## 2020-02-01 LAB — POCT URINALYSIS DIPSTICK
Blood, UA: NEGATIVE
Glucose, UA: NEGATIVE
Ketones, UA: 3
Leukocytes, UA: NEGATIVE
Nitrite, UA: NEGATIVE
Protein, UA: POSITIVE — AB
Spec Grav, UA: 1.02 (ref 1.010–1.025)
Urobilinogen, UA: 1 E.U./dL
pH, UA: 6 (ref 5.0–8.0)

## 2020-02-01 MED ORDER — SCOPOLAMINE 1 MG/3DAYS TD PT72: 1.0000 | MEDICATED_PATCH | TRANSDERMAL | 3 refills | Status: DC

## 2020-02-01 MED ORDER — RHO D IMMUNE GLOBULIN 1500 UNIT/2ML IJ SOSY
300.0000 ug | PREFILLED_SYRINGE | Freq: Once | INTRAMUSCULAR | Status: AC
  Administered 2020-02-01: 300 ug via INTRAMUSCULAR

## 2020-02-01 NOTE — Progress Notes (Signed)
ROB at 28 weeks: Has been having continued problems with nausea and vomiting. Seen in L&D last week for IV hydration and IV antiemetics. Usually taking 2 Diclegis at hs with some phenergan. Nausea and vomiting worse in AM. Has been keeping down more food and fluids during the day. Urine looked clear during the day yesterday, No dysuria. Was not able to eat or drink this Am before appointment. Took a Zofran so she would be able to keep down the glucola today. Baby moving very well. No vaginal bleeding.  Exam: BP 114/70 Weight unchanged from last week (250#). FH 33cm FHT 143 Results for orders placed or performed in visit on 02/01/20 (from the past 24 hour(s))  POC Urinalysis Dipstick OB     Status: None   Collection Time: 02/01/20  8:52 AM  Result Value Ref Range   Color, UA     Clarity, UA     Glucose, UA Negative Negative   Bilirubin, UA     Ketones, UA     Spec Grav, UA     Blood, UA     pH, UA     POC,PROTEIN,UA Moderate (2+) Negative, Trace, Small (1+), Moderate (2+), Large (3+), 4+   Urobilinogen, UA     Nitrite, UA     Leukocytes, UA     Appearance     Odor    POCT Urinalysis Dipstick     Status: Abnormal   Collection Time: 02/01/20  9:10 AM  Result Value Ref Range   Color, UA     Clarity, UA     Glucose, UA Negative Negative   Bilirubin, UA trace    Ketones, UA 3    Spec Grav, UA 1.020 1.010 - 1.025   Blood, UA negative    pH, UA 6.0 5.0 - 8.0   Protein, UA Positive (A) Negative   Urobilinogen, UA 1.0 0.2 or 1.0 E.U./dL   Nitrite, UA negative    Leukocytes, UA Negative Negative   Appearance dark amber    Odor     +2 to +3 proteinuria. Urine the color of tea A negative blood type  A: IUP at 28 weeks S>D Continued nausea and vomiting-concentrated urine probably causing proteinuria on dipstick  R/O UTI RH negative  P: Growth scan and ROB in 1 week Trial of Scopolamine transdermal-change patch every 3 days-increase water/ fluid intake RHOGAM today after 28 week  labs drawn Urine culture  Farrel Conners, CNM

## 2020-02-02 LAB — 28 WEEKS RH-PANEL
Antibody Screen: NEGATIVE
Basophils Absolute: 0 10*3/uL (ref 0.0–0.2)
Basos: 0 %
EOS (ABSOLUTE): 0.1 10*3/uL (ref 0.0–0.4)
Eos: 1 %
Gestational Diabetes Screen: 124 mg/dL (ref 65–139)
HIV Screen 4th Generation wRfx: NONREACTIVE
Hematocrit: 39.2 % (ref 34.0–46.6)
Hemoglobin: 12.6 g/dL (ref 11.1–15.9)
Immature Grans (Abs): 0 10*3/uL (ref 0.0–0.1)
Immature Granulocytes: 0 %
Lymphocytes Absolute: 1.3 10*3/uL (ref 0.7–3.1)
Lymphs: 15 %
MCH: 29.2 pg (ref 26.6–33.0)
MCHC: 32.1 g/dL (ref 31.5–35.7)
MCV: 91 fL (ref 79–97)
Monocytes Absolute: 0.5 10*3/uL (ref 0.1–0.9)
Monocytes: 5 %
Neutrophils Absolute: 7.1 10*3/uL — ABNORMAL HIGH (ref 1.4–7.0)
Neutrophils: 79 %
Platelets: 220 10*3/uL (ref 150–450)
RBC: 4.31 x10E6/uL (ref 3.77–5.28)
RDW: 13.3 % (ref 11.7–15.4)
RPR Ser Ql: NONREACTIVE
WBC: 9 10*3/uL (ref 3.4–10.8)

## 2020-02-03 LAB — URINE CULTURE

## 2020-02-09 ENCOUNTER — Ambulatory Visit (INDEPENDENT_AMBULATORY_CARE_PROVIDER_SITE_OTHER): Payer: BC Managed Care – PPO

## 2020-02-09 ENCOUNTER — Other Ambulatory Visit: Payer: Self-pay

## 2020-02-09 ENCOUNTER — Ambulatory Visit (INDEPENDENT_AMBULATORY_CARE_PROVIDER_SITE_OTHER): Payer: BC Managed Care – PPO | Admitting: Obstetrics

## 2020-02-09 VITALS — BP 100/70 | Wt 256.0 lb

## 2020-02-09 DIAGNOSIS — O0990 Supervision of high risk pregnancy, unspecified, unspecified trimester: Secondary | ICD-10-CM

## 2020-02-09 DIAGNOSIS — Z3A29 29 weeks gestation of pregnancy: Secondary | ICD-10-CM

## 2020-02-09 DIAGNOSIS — Z23 Encounter for immunization: Secondary | ICD-10-CM

## 2020-02-09 DIAGNOSIS — O26843 Uterine size-date discrepancy, third trimester: Secondary | ICD-10-CM | POA: Diagnosis not present

## 2020-02-09 DIAGNOSIS — O0993 Supervision of high risk pregnancy, unspecified, third trimester: Secondary | ICD-10-CM

## 2020-02-09 LAB — POCT URINALYSIS DIPSTICK OB
Glucose, UA: NEGATIVE
POC,PROTEIN,UA: NEGATIVE

## 2020-02-09 NOTE — Progress Notes (Signed)
Here for ROB and growth sono. Doing well Reports her appetite has returned since starting on a Scopolamine patch. O: See VS      Sono: Growth is 57%, and AC is 66%. A: IUP 29 weeks      High BMI- growth scans P: RTC in 2 weeks. Discussed having 2 support people in labor, and for PP stay. [name], CNM  02/09/2020 2:31 PM

## 2020-02-09 NOTE — Progress Notes (Signed)
ROB/Growth Scan/TDAP and BT consent today- abdominal rash, very itchy x 5 days

## 2020-02-13 ENCOUNTER — Other Ambulatory Visit: Payer: Self-pay

## 2020-02-13 ENCOUNTER — Observation Stay (HOSPITAL_BASED_OUTPATIENT_CLINIC_OR_DEPARTMENT_OTHER)
Admission: EM | Admit: 2020-02-13 | Discharge: 2020-02-14 | Disposition: A | Discharge status: Home / Self Care | Payer: BC Managed Care – PPO | Source: Home / Self Care | Admitting: Obstetrics

## 2020-02-13 ENCOUNTER — Encounter: Payer: Self-pay | Admitting: Obstetrics and Gynecology

## 2020-02-13 DIAGNOSIS — Z881 Allergy status to other antibiotic agents status: Secondary | ICD-10-CM | POA: Insufficient documentation

## 2020-02-13 DIAGNOSIS — O212 Late vomiting of pregnancy: Secondary | ICD-10-CM | POA: Insufficient documentation

## 2020-02-13 DIAGNOSIS — O210 Mild hyperemesis gravidarum: Secondary | ICD-10-CM

## 2020-02-13 DIAGNOSIS — Z3A29 29 weeks gestation of pregnancy: Secondary | ICD-10-CM

## 2020-02-13 DIAGNOSIS — O0990 Supervision of high risk pregnancy, unspecified, unspecified trimester: Secondary | ICD-10-CM

## 2020-02-13 DIAGNOSIS — Z6837 Body mass index (BMI) 37.0-37.9, adult: Secondary | ICD-10-CM

## 2020-02-13 DIAGNOSIS — Z880 Allergy status to penicillin: Secondary | ICD-10-CM | POA: Insufficient documentation

## 2020-02-13 DIAGNOSIS — O99211 Obesity complicating pregnancy, first trimester: Secondary | ICD-10-CM

## 2020-02-13 LAB — URINALYSIS, ROUTINE W REFLEX MICROSCOPIC
Bilirubin Urine: NEGATIVE
Glucose, UA: 50 mg/dL — AB
Hgb urine dipstick: NEGATIVE
Ketones, ur: 80 mg/dL — AB
Leukocytes,Ua: NEGATIVE
Nitrite: NEGATIVE
Protein, ur: NEGATIVE mg/dL
Specific Gravity, Urine: 1.018 (ref 1.005–1.030)
pH: 7 (ref 5.0–8.0)

## 2020-02-13 MED ORDER — DEXTROSE IN LACTATED RINGERS 5 % IV SOLN
INTRAVENOUS | Status: DC
  Administered 2020-02-13: 500 mL via INTRAVENOUS

## 2020-02-13 MED ORDER — ONDANSETRON HCL 4 MG/2ML IJ SOLN
4.0000 mg | Freq: Four times a day (QID) | INTRAMUSCULAR | Status: AC | PRN
  Administered 2020-02-14: 4 mg via INTRAVENOUS
  Filled 2020-02-13: qty 2

## 2020-02-13 MED ORDER — LACTATED RINGERS IV SOLN
INTRAVENOUS | Status: DC
  Administered 2020-02-13: 500 mL via INTRAVENOUS

## 2020-02-13 MED ORDER — PROMETHAZINE HCL 25 MG/ML IJ SOLN
INTRAMUSCULAR | Status: AC
  Administered 2020-02-13: 12.5 mg
  Filled 2020-02-13: qty 1

## 2020-02-13 MED ORDER — PROMETHAZINE HCL 25 MG/ML IJ SOLN: 12.5000 mg | Freq: Four times a day (QID) | INTRAMUSCULAR | Status: DC | PRN

## 2020-02-13 MED ORDER — PROMETHAZINE HCL 25 MG RE SUPP
25.0000 mg | Freq: Three times a day (TID) | RECTAL | Status: DC | PRN
  Filled 2020-02-13: qty 1

## 2020-02-13 NOTE — OB Triage Note (Signed)
Pt [redacted]w[redacted]d G2P1 presents to birthplace with complaints of contractions every 5-7 min starting around 7pm, states tightness and pressure in lower abdomen. Reports good fetal movement, no LOF, no vag bleeding. Reports intercourse yesterday. VSS. Monitors applied. fht @ 2118 150.

## 2020-02-13 NOTE — Final Progress Note (Signed)
OB History & Physical   History of Present Illness:  Chief Complaint:   HPI:  Madison Mack is a 24 y.o. G2P1001 female at [redacted]w[redacted]d dated by LMP and ultrasound.  Her pregnancy has been complicated by ongoing hyperemesis.  She presents to L&D for evaluation of nausea and vomiting all day today which has not responded to her home supply of Zofran.   She admits to mild  Contractions.   She denies Leakage of fluid.   She denies Vaginal Bleeding.   She reports Fetal movement.   Prenatal care site: Prenatal care at [facility] has been remarkable for hyperemesis gravidarum, obesity, depression, cyclic vomiting syndrome.       Maternal Medical History:   Past Medical History:  Diagnosis Date  . Allergy   . Anxiety   . HA (headache)   . Nausea & vomiting     Past Surgical History:  Procedure Laterality Date  . ANKLE ARTHODESIS W/ ARTHROSCOPY Right   . ANKLE FRACTURE SURGERY Right 04/08  . ESOPHAGOGASTRODUODENOSCOPY (EGD) WITH PROPOFOL N/A 08/10/2019   Procedure: ESOPHAGOGASTRODUODENOSCOPY (EGD) WITH PROPOFOL;  Surgeon: Pasty Spillers, MD;  Location: [facility] ENDOSCOPY;  Service: Endoscopy;  Laterality: N/A;    Allergies  Allergen Reactions  . Penicillins Rash    Has patient had a PCN reaction causing immediate rash, facial/tongue/throat swelling, SOB or lightheadedness with hypotension: SWELLING FEET AND HANDS, HIVES Has patient had a PCN reaction causing severe rash involving mucus membranes or skin necrosis: NO Has patient had a PCN reaction that required hospitalization: NO Has patient had a PCN reaction occurring within the last 10 years: NO If all of the above answers are "NO", then may proceed with Cephalosporin use.   Marland Kitchen Amoxicillin   . Doxycycline Hives    Prior to Admission medications   Medication Sig Start Date End Date Taking? Authorizing Provider  omeprazole (PRILOSEC) 20 MG capsule Take 20 mg by mouth daily.   Yes [provider]  Prenatal Vit-Fe  Fumarate-FA (MULTIVITAMIN-PRENATAL) 27-0.8 MG TABS tablet Take 1 tablet by mouth daily at 12 noon.   Yes [provider]  scopolamine (TRANSDERM-SCOP, 1.5 MG,) 1 MG/3DAYS Place 1 patch (1.5 mg total) onto the skin every 3 (three) days. 02/01/20  Yes Farrel Conners, CNM  albuterol (VENTOLIN HFA) 108 (90 Base) MCG/ACT inhaler Inhale 2 puffs into the lungs every 6 (six) hours as needed for wheezing or shortness of breath. Patient not taking: Reported on 02/09/2020 01/16/20   Arnaldo Natal, MD  diphenhydrAMINE (BENADRYL) 25 MG tablet Take 25 mg by mouth every 6 (six) hours as needed.  Patient not taking: Reported on 02/09/2020    [provider]          Social History: She  reports that she has never smoked. She has never used smokeless tobacco. She reports that she does not drink alcohol and does not use drugs.  Family History: family history includes ADD / ADHD in her maternal aunt; Alcohol abuse in her maternal aunt and maternal uncle; Anxiety disorder in her maternal aunt and paternal grandfather; Dementia in her paternal grandmother; Depression in her paternal grandfather; Drug abuse in her maternal aunt and maternal uncle; Hyperlipidemia in her father; Hypertension in her mother.   Review of Systems: Negative x 10 systems reviewed except as noted in the HPI.      Physical Exam:  Vital Signs: BP 123/71 (BP Location: Right Arm)   Pulse 82   Temp 98.8 F (37.1 C) (  Oral)   Resp 18   Ht 5\' 8"  (1.727 m)   Wt 116.1 kg   LMP 07/16/2019 (Exact Date)   BMI 38.92 kg/m  General: no acute distress.  HEENT: normocephalic, atraumatic Heart: regular rate & rhythm.  No murmurs/rubs/gallops Lungs: clear to auscultation bilaterally Abdomen: soft, gravid, non-tender;  + bowel sounds Pelvic:deferred Extremities: non-tender, symmetric, no edema bilaterally.   Neurologic: Alert & oriented x 3.    Pertinent Results:  Prenatal Labs: Blood type/Rh A negative  Antibody screen  neg  Rubella immune  RPR NR  HBsAg negative  HIV neg  GC {neg  Chlamydia {neg  Genetic screening First screen negative  1 hour GTT Early 118  3 hour GTT na  GBS To be done at 35 weeks   Baseline FHR: 145 beats    Variability: moderate    Accelerations: present    Decelerations: absent Contractions: irregular, mild Overall assessment: Reactive NST.   Assessment:  Madison Mack is a 24 y.o. G2P1001 female at [redacted]w[redacted]d with hyperemesis and cyclic vomiting syndrome, seen for nausea and vomiting today..   Plan:  1. IV bolus of D5LR, to be followed by LR. 2. IV Phenergan 12.5 mg 3. EFM 4. UA sent 5. Will reevalaute post IV fluid rehydration, and will discharge home once no longer contracting. 6. F/U planned at [facility] this week.  [facility]  02/13/2020 10:59 PM

## 2020-02-14 ENCOUNTER — Inpatient Hospital Stay
Admission: EM | Admit: 2020-02-14 | Discharge: 2020-02-19 | DRG: 832 | Disposition: A | Discharge status: Home / Self Care | Payer: BC Managed Care – PPO | Attending: Obstetrics and Gynecology | Admitting: Obstetrics and Gynecology

## 2020-02-14 ENCOUNTER — Other Ambulatory Visit: Payer: Self-pay | Admitting: Obstetrics

## 2020-02-14 ENCOUNTER — Telehealth: Payer: Self-pay

## 2020-02-14 ENCOUNTER — Encounter: Payer: Self-pay | Admitting: Obstetrics and Gynecology

## 2020-02-14 DIAGNOSIS — Z3A29 29 weeks gestation of pregnancy: Secondary | ICD-10-CM

## 2020-02-14 DIAGNOSIS — O99343 Other mental disorders complicating pregnancy, third trimester: Secondary | ICD-10-CM | POA: Diagnosis present

## 2020-02-14 DIAGNOSIS — O210 Mild hyperemesis gravidarum: Secondary | ICD-10-CM | POA: Diagnosis not present

## 2020-02-14 DIAGNOSIS — O99323 Drug use complicating pregnancy, third trimester: Secondary | ICD-10-CM | POA: Diagnosis present

## 2020-02-14 DIAGNOSIS — F1290 Cannabis use, unspecified, uncomplicated: Secondary | ICD-10-CM | POA: Diagnosis present

## 2020-02-14 DIAGNOSIS — E860 Dehydration: Secondary | ICD-10-CM | POA: Diagnosis not present

## 2020-02-14 DIAGNOSIS — O0990 Supervision of high risk pregnancy, unspecified, unspecified trimester: Secondary | ICD-10-CM

## 2020-02-14 DIAGNOSIS — O99613 Diseases of the digestive system complicating pregnancy, third trimester: Secondary | ICD-10-CM | POA: Diagnosis present

## 2020-02-14 DIAGNOSIS — F419 Anxiety disorder, unspecified: Secondary | ICD-10-CM | POA: Diagnosis present

## 2020-02-14 DIAGNOSIS — Z20822 Contact with and (suspected) exposure to covid-19: Secondary | ICD-10-CM | POA: Diagnosis present

## 2020-02-14 DIAGNOSIS — O99891 Other specified diseases and conditions complicating pregnancy: Secondary | ICD-10-CM | POA: Diagnosis present

## 2020-02-14 DIAGNOSIS — O219 Vomiting of pregnancy, unspecified: Secondary | ICD-10-CM

## 2020-02-14 DIAGNOSIS — F411 Generalized anxiety disorder: Secondary | ICD-10-CM

## 2020-02-14 DIAGNOSIS — K2270 Barrett's esophagus without dysplasia: Secondary | ICD-10-CM | POA: Diagnosis present

## 2020-02-14 DIAGNOSIS — K219 Gastro-esophageal reflux disease without esophagitis: Secondary | ICD-10-CM | POA: Diagnosis present

## 2020-02-14 DIAGNOSIS — Z3A30 30 weeks gestation of pregnancy: Secondary | ICD-10-CM | POA: Diagnosis not present

## 2020-02-14 DIAGNOSIS — R197 Diarrhea, unspecified: Secondary | ICD-10-CM | POA: Diagnosis present

## 2020-02-14 DIAGNOSIS — Z880 Allergy status to penicillin: Secondary | ICD-10-CM

## 2020-02-14 LAB — COMPREHENSIVE METABOLIC PANEL
ALT: 19 U/L (ref 0–44)
AST: 15 U/L (ref 15–41)
Albumin: 3 g/dL — ABNORMAL LOW (ref 3.5–5.0)
Alkaline Phosphatase: 73 U/L (ref 38–126)
Anion gap: 9 (ref 5–15)
BUN: 7 mg/dL (ref 6–20)
CO2: 21 mmol/L — ABNORMAL LOW (ref 22–32)
Calcium: 8.6 mg/dL — ABNORMAL LOW (ref 8.9–10.3)
Chloride: 107 mmol/L (ref 98–111)
Creatinine, Ser: 0.52 mg/dL (ref 0.44–1.00)
GFR calc Af Amer: >60 mL/min (ref >60)
GFR calc non Af Amer: >60 mL/min (ref >60)
Glucose, Bld: 80 mg/dL (ref 70–99)
Potassium: 3.5 mmol/L (ref 3.5–5.1)
Sodium: 137 mmol/L (ref 135–145)
Total Bilirubin: 1.1 mg/dL (ref 0.3–1.2)
Total Protein: 6.6 g/dL (ref 6.5–8.1)

## 2020-02-14 LAB — CBC
HCT: 32.8 % — ABNORMAL LOW (ref 36.0–46.0)
Hemoglobin: 11.5 g/dL — ABNORMAL LOW (ref 12.0–15.0)
MCH: 29.6 pg (ref 26.0–34.0)
MCHC: 35.1 g/dL (ref 30.0–36.0)
MCV: 84.3 fL (ref 80.0–100.0)
Platelets: 210 10*3/uL (ref 150–400)
RBC: 3.89 MIL/uL (ref 3.87–5.11)
RDW: 14.6 % (ref 11.5–15.5)
WBC: 10.4 10*3/uL (ref 4.0–10.5)
nRBC: 0 % (ref 0.0–0.2)

## 2020-02-14 LAB — MAGNESIUM: Magnesium: 1.8 mg/dL (ref 1.7–2.4)

## 2020-02-14 MED ORDER — ONDANSETRON HCL 4 MG/2ML IJ SOLN: 4.0000 mg | Freq: Four times a day (QID) | INTRAMUSCULAR | Status: DC | PRN

## 2020-02-14 MED ORDER — HYDROXYZINE HCL 25 MG PO TABS
50.0000 mg | ORAL_TABLET | Freq: Four times a day (QID) | ORAL | Status: DC | PRN
  Filled 2020-02-14: qty 1

## 2020-02-14 MED ORDER — METOCLOPRAMIDE HCL 5 MG/ML IJ SOLN
10.0000 mg | Freq: Four times a day (QID) | INTRAMUSCULAR | Status: DC
  Administered 2020-02-14 – 2020-02-18 (×14): 10 mg via INTRAVENOUS
  Filled 2020-02-14 (×14): qty 2

## 2020-02-14 MED ORDER — THIAMINE HCL 100 MG/ML IJ SOLN
Freq: Once | INTRAVENOUS | Status: AC
  Filled 2020-02-14: qty 1000

## 2020-02-14 MED ORDER — FAMOTIDINE IN NACL 20-0.9 MG/50ML-% IV SOLN
20.0000 mg | Freq: Two times a day (BID) | INTRAVENOUS | Status: DC
  Administered 2020-02-14 – 2020-02-17 (×7): 20 mg via INTRAVENOUS
  Filled 2020-02-14 (×13): qty 50

## 2020-02-14 MED ORDER — FAMOTIDINE 20 MG PO TABS
20.0000 mg | ORAL_TABLET | Freq: Two times a day (BID) | ORAL | Status: DC
  Administered 2020-02-18 – 2020-02-19 (×3): 20 mg via ORAL
  Filled 2020-02-14 (×3): qty 1

## 2020-02-14 MED ORDER — METOCLOPRAMIDE HCL 10 MG PO TABS
10.0000 mg | ORAL_TABLET | Freq: Four times a day (QID) | ORAL | Status: DC
  Administered 2020-02-17 – 2020-02-19 (×5): 10 mg via ORAL
  Filled 2020-02-14 (×6): qty 1

## 2020-02-14 MED ORDER — PROMETHAZINE HCL 25 MG RE SUPP
12.5000 mg | RECTAL | Status: DC | PRN
  Administered 2020-02-15: 25 mg via RECTAL
  Filled 2020-02-14 (×2): qty 1

## 2020-02-14 MED ORDER — SODIUM CHLORIDE 0.9 % IV SOLN
8.0000 mg | Freq: Three times a day (TID) | INTRAVENOUS | Status: DC | PRN
  Filled 2020-02-14 (×2): qty 4

## 2020-02-14 MED ORDER — LACTATED RINGERS IV BOLUS
1000.0000 mL | Freq: Once | INTRAVENOUS | Status: AC
  Administered 2020-02-14: 1000 mL via INTRAVENOUS

## 2020-02-14 MED ORDER — HYDROXYZINE HCL 50 MG/ML IM SOLN
50.0000 mg | Freq: Four times a day (QID) | INTRAMUSCULAR | Status: DC | PRN
  Filled 2020-02-14: qty 1

## 2020-02-14 MED ORDER — SERTRALINE HCL 50 MG PO TABS: 50.0000 mg | ORAL_TABLET | Freq: Every day | ORAL | 2 refills | Status: DC

## 2020-02-14 MED ORDER — ONDANSETRON HCL 4 MG/2ML IJ SOLN
4.0000 mg | Freq: Three times a day (TID) | INTRAMUSCULAR | Status: DC | PRN
  Administered 2020-02-14 – 2020-02-19 (×10): 4 mg via INTRAVENOUS
  Filled 2020-02-14 (×10): qty 2

## 2020-02-14 MED ORDER — PROMETHAZINE HCL 25 MG PO TABS
12.5000 mg | ORAL_TABLET | ORAL | Status: DC | PRN
  Administered 2020-02-18 (×2): 25 mg via ORAL
  Filled 2020-02-14 (×3): qty 1

## 2020-02-14 MED ORDER — ONDANSETRON 4 MG PO TBDP
4.0000 mg | ORAL_TABLET | Freq: Three times a day (TID) | ORAL | Status: DC | PRN
  Administered 2020-02-19: 8 mg via ORAL
  Filled 2020-02-14: qty 2

## 2020-02-14 MED ORDER — PROMETHAZINE HCL 25 MG RE SUPP: 25.0000 mg | Freq: Three times a day (TID) | RECTAL | 0 refills | Status: DC | PRN

## 2020-02-14 NOTE — OB Triage Note (Signed)
Patient came in for observation for vomiting and diarrhea since 0430. Patient states she has had 6 episodes of vomiting and diarrhea today and has not been able to keep anything down. Patient denies uterine contractions. Patient reports +FM. Patient denies leaking of fluid and denies vaginal bleeding and spotting. Vital signs stable and patient afebrile. FHR baseline 140 with moderate variability with accelerations 15 x 15 and no decelerations. Patient in room by herself and monitors applied.

## 2020-02-14 NOTE — Discharge Summary (Signed)
See final Progress Note [name], CNM  02/14/2020 12:21 AM

## 2020-02-14 NOTE — Progress Notes (Signed)
Preparing to discharge patient home. See previous Final progress note. Discussed patient's documented history of Depressed mood, and in light of her ongoing struggle with Hyperemesis this pregnancy, inquired regarding her emotional status. She admits to feeling anxious and stressed from the many visits to address her Nausea and vomiting.  We discussed the affect of ongling hyperemeisi in pregnancy and mood.  In the past she has used Prozac for general anxiety or depressive sxs. Offered her the option of a trial of zoloft to adddress her anxiety and she expresses a desire to start on medication. The risk/benefits of SSRI medications reviewed carefully with her. The common side effects also described.  We mutually agreed to start on Zoloft 50 mg po daily, to be take in the evening before bed.  A RX for 30 tablets with two refills sent to her pharmacy. She will follow up on the effects of the medication with the [facility] providers over the next few visits.   [name], CNM  02/14/2020 12:30 AM

## 2020-02-14 NOTE — Telephone Encounter (Signed)
Pt calling; went to ED for ctxs and n/v; was rx'd phenergan supp; still vomiting and still feels weak; urine is orange.  [phone]  Mailbox is full.

## 2020-02-14 NOTE — H&P (Signed)
Madison Mack is an 24 y.o. female.   Chief Complaint: Nausea and liquid stools HPI:  24 yo G2P1001 [redacted]w[redacted]d  She  have been struggling this pregnancy with severe nausea and vomiting. She had some issues prior to pregnancy with cyclical vomiting and Barrett's esophagus. These issues had been present for 6-8 months (on and off) In December she had a upper endoscopy done 1 week before she found out she was pregnant.  She saw Dr. Maximino Greenland for this procedure. She has not seen her since.  The plan was to take protonix or omeprazole. She still takes the omeprazole. About 1 months ago she was here for a CT scan and she had a small hiatal hernia.   She has tried oral medications (reglan, phenergan, zofran, and dramamine, B6 and unisom), suppositories, and scopolamine patch but nothing is helping. She can't eat. She can't keep fluids down. She can't care for her child.  She was seen last night on L&D for contraction. The contractions resolved but the vomiting worsened.  This morning when she urinated she saw orange urine. She also had liquids stools 4-5 times today which were orange in color. She has been dizzy today and seeing stars. She felt very weird after taking the phenergan suppository.   She has lost 6 lbs in 1 week.  She started this pregnancy at 265 lbs and now is 251lbs - she has lost 14lbs.    She occasionally has heart burn.  She has vomited 6-7 times today She threw up with her first bite of food today.  She last kept down food on Sunday.  She last kept down water yesterday.    pregnancy Problems (from 08/29/19 to present)    Problem Noted Resolved   Supervision of high risk pregnancy, antepartum 08/29/2019 by Conard Novak, MD No   Overview Addendum 02/09/2020  2:31 PM by [name], CNM    Clinic [facility] Prenatal Labs  Dating 6wk6d ultrasound Blood type: A/Negative/-- (01/18 1107)   Genetic Screen 1 Screen:  Unable to get NT Antibody:Negative (01/18 1107)  Anatomic [place]  [facility], 5/26 complete Rubella: 3.06 (01/18 1107) Varicella: Immune  GTT Early:   118  Third trimester:  RPR: Non Reactive (01/18 1107)   Rhogam 02/01/20 HBsAg: Negative (01/18 1107)   TDaP vaccine                       Flu Shot: HIV: Non Reactive (01/18 1107)   Baby Food      Breast                          GBS:   Contraception Mirena Pap:08/2019  CBB  No   CS/VBAC N/A   Support Person Husband           Previous Version   Obesity affecting pregnancy in first trimester 08/29/2019 by Conard Novak, MD No   Overview Signed 08/29/2019  5:10 PM by Conard Novak, MD    [ ]  early 1h gtt      BMI 37.0-37.9, adult 08/29/2019 by 08/31/2019, MD No        Past Medical History:  Diagnosis Date  . Allergy   . Anxiety   . HA (headache)   . Nausea & vomiting     Past Surgical History:  Procedure Laterality Date  . ANKLE ARTHODESIS W/ ARTHROSCOPY Right   . ANKLE FRACTURE SURGERY Right 04/08  .  ESOPHAGOGASTRODUODENOSCOPY (EGD) WITH PROPOFOL N/A 08/10/2019   Procedure: ESOPHAGOGASTRODUODENOSCOPY (EGD) WITH PROPOFOL;  Surgeon: Pasty Spillers, MD;  Location: [facility] ENDOSCOPY;  Service: Endoscopy;  Laterality: N/A;    Family History  Problem Relation Age of Onset  . Hyperlipidemia Father   . Hypertension Mother   . ADD / ADHD Maternal Aunt   . Alcohol abuse Maternal Aunt   . Drug abuse Maternal Aunt   . Anxiety disorder Maternal Aunt   . Alcohol abuse Maternal Uncle   . Drug abuse Maternal Uncle   . Anxiety disorder Paternal Grandfather   . Depression Paternal Grandfather   . Dementia Paternal Grandmother    Social History:  reports that she has never smoked. She has never used smokeless tobacco. She reports that she does not drink alcohol and does not use drugs.  Allergies:  Allergies  Allergen Reactions  . Penicillins Rash    Has patient had a PCN reaction causing immediate rash, facial/tongue/throat swelling, SOB or lightheadedness with hypotension: SWELLING  FEET AND HANDS, HIVES Has patient had a PCN reaction causing severe rash involving mucus membranes or skin necrosis: NO Has patient had a PCN reaction that required hospitalization: NO Has patient had a PCN reaction occurring within the last 10 years: NO If all of the above answers are "NO", then may proceed with Cephalosporin use.   Marland Kitchen Amoxicillin   . Doxycycline Hives    Medications Prior to Admission  Medication Sig Dispense Refill  . omeprazole (PRILOSEC) 20 MG capsule Take 20 mg by mouth daily.    . Prenatal Vit-Fe Fumarate-FA (MULTIVITAMIN-PRENATAL) 27-0.8 MG TABS tablet Take 1 tablet by mouth daily at 12 noon.    . promethazine (PHENERGAN) 25 MG suppository Place 1 suppository (25 mg total) rectally every 8 (eight) hours as needed for nausea or vomiting. 12 each 0  . sertraline (ZOLOFT) 50 MG tablet Take 1 tablet (50 mg total) by mouth daily. 30 tablet 2  . albuterol (VENTOLIN HFA) 108 (90 Base) MCG/ACT inhaler Inhale 2 puffs into the lungs every 6 (six) hours as needed for wheezing or shortness of breath. (Patient not taking: Reported on 02/09/2020) 8 g 0  . diphenhydrAMINE (BENADRYL) 25 MG tablet Take 25 mg by mouth every 6 (six) hours as needed.  (Patient not taking: Reported on 02/09/2020)    . scopolamine (TRANSDERM-SCOP, 1.5 MG,) 1 MG/3DAYS Place 1 patch (1.5 mg total) onto the skin every 3 (three) days. 10 patch 3    Results for orders placed or performed during the hospital encounter of 02/13/20 (from the past 48 hour(s))  Urinalysis, Routine w reflex microscopic     Status: Abnormal   Collection Time: 02/13/20 10:52 PM  Result Value Ref Range   Color, Urine YELLOW (A) YELLOW   APPearance HAZY (A) CLEAR   Specific Gravity, Urine 1.018 1.005 - 1.030   pH 7.0 5.0 - 8.0   Glucose, UA 50 (A) NEGATIVE mg/dL   Hgb urine dipstick NEGATIVE NEGATIVE   Bilirubin Urine NEGATIVE NEGATIVE   Ketones, ur 80 (A) NEGATIVE mg/dL   Protein, ur NEGATIVE NEGATIVE mg/dL   Nitrite NEGATIVE  NEGATIVE   Leukocytes,Ua NEGATIVE NEGATIVE    Comment: Performed at [facility], [address] [place], [place] [address]   No results found.  Review of Systems  Constitutional: Negative for chills and fever.  HENT: Negative for congestion, hearing loss and sinus pain.   Respiratory: Negative for cough, shortness of breath and wheezing.   Cardiovascular: Negative for  chest pain, palpitations and leg swelling.  Gastrointestinal: Negative for abdominal pain, constipation, diarrhea, nausea and vomiting.  Genitourinary: Negative for dysuria, flank pain, frequency, hematuria and urgency.  Musculoskeletal: Negative for back pain.  Skin: Negative for rash.  Neurological: Negative for dizziness and headaches.  Psychiatric/Behavioral: Negative for suicidal ideas. The patient is not nervous/anxious.     Blood pressure 134/67, pulse 83, temperature 98.3 F (36.8 C), temperature source Oral, resp. rate 18, height 5\' 8"  (1.727 m), weight 113.9 kg, last menstrual period 07/16/2019.   Physical Exam Vitals and nursing note reviewed.  Constitutional:      Appearance: She is well-developed.  HENT:     Head: Normocephalic and atraumatic.  Cardiovascular:     Rate and Rhythm: Normal rate and regular rhythm.  Pulmonary:     Effort: Pulmonary effort is normal.     Breath sounds: Normal breath sounds.  Abdominal:     General: Bowel sounds are normal.     Palpations: Abdomen is soft.  Musculoskeletal:        General: Normal range of motion.  Skin:    General: Skin is warm and dry.  Neurological:     Mental Status: She is alert and oriented to person, place, and time.  Psychiatric:        Behavior: Behavior normal.        Thought Content: Thought content normal.        Judgment: Judgment normal.     Assessment/Plan 24 yo G2P1001 [redacted]w[redacted]d with Hyperemesis Gravidarum  1. Will initiate hyperemesis protocol with IV and PO meds as tolerated.  2. Fluid recessitation  3.  No electrolyte  abnormalities at this time. 4. Will consider steroid taper if symptoms do not improve overnight 5. Given patient's significant weight loss and nausea if these therapies are not successful it will be reasonable to consider tube feeds. Discussed this possible option with the patient briefly.  6. Will consult GI in the AM.  7. Liquid diarrhea, Imodium PRM, will monitor color.  8. SCDs and pepcid  9. NPO- advance to clears as tolerated.    [redacted]w[redacted]d, MD 02/14/2020, 8:59 PM

## 2020-02-15 ENCOUNTER — Observation Stay: Payer: BC Managed Care – PPO

## 2020-02-15 DIAGNOSIS — O212 Late vomiting of pregnancy: Secondary | ICD-10-CM | POA: Diagnosis present

## 2020-02-15 DIAGNOSIS — O219 Vomiting of pregnancy, unspecified: Secondary | ICD-10-CM

## 2020-02-15 DIAGNOSIS — O99343 Other mental disorders complicating pregnancy, third trimester: Secondary | ICD-10-CM | POA: Diagnosis present

## 2020-02-15 DIAGNOSIS — O99323 Drug use complicating pregnancy, third trimester: Secondary | ICD-10-CM | POA: Diagnosis present

## 2020-02-15 DIAGNOSIS — K2270 Barrett's esophagus without dysplasia: Secondary | ICD-10-CM | POA: Diagnosis present

## 2020-02-15 DIAGNOSIS — Z3A30 30 weeks gestation of pregnancy: Secondary | ICD-10-CM | POA: Diagnosis not present

## 2020-02-15 DIAGNOSIS — O210 Mild hyperemesis gravidarum: Secondary | ICD-10-CM | POA: Diagnosis present

## 2020-02-15 DIAGNOSIS — Z3A29 29 weeks gestation of pregnancy: Secondary | ICD-10-CM | POA: Diagnosis not present

## 2020-02-15 DIAGNOSIS — R197 Diarrhea, unspecified: Secondary | ICD-10-CM | POA: Diagnosis present

## 2020-02-15 DIAGNOSIS — O99891 Other specified diseases and conditions complicating pregnancy: Secondary | ICD-10-CM | POA: Diagnosis present

## 2020-02-15 DIAGNOSIS — K219 Gastro-esophageal reflux disease without esophagitis: Secondary | ICD-10-CM | POA: Diagnosis present

## 2020-02-15 DIAGNOSIS — O99613 Diseases of the digestive system complicating pregnancy, third trimester: Secondary | ICD-10-CM | POA: Diagnosis present

## 2020-02-15 DIAGNOSIS — F1290 Cannabis use, unspecified, uncomplicated: Secondary | ICD-10-CM | POA: Diagnosis present

## 2020-02-15 DIAGNOSIS — Z20822 Contact with and (suspected) exposure to covid-19: Secondary | ICD-10-CM | POA: Diagnosis present

## 2020-02-15 DIAGNOSIS — Z880 Allergy status to penicillin: Secondary | ICD-10-CM | POA: Diagnosis not present

## 2020-02-15 DIAGNOSIS — F419 Anxiety disorder, unspecified: Secondary | ICD-10-CM | POA: Diagnosis present

## 2020-02-15 LAB — C DIFFICILE QUICK SCREEN W PCR REFLEX
C Diff antigen: NEGATIVE
C Diff interpretation: NOT DETECTED
C Diff toxin: NEGATIVE

## 2020-02-15 LAB — GLUCOSE, CAPILLARY: Glucose-Capillary: 76 mg/dL (ref 70–99)

## 2020-02-15 MED ORDER — LACTATED RINGERS IV SOLN: INTRAVENOUS | Status: DC

## 2020-02-15 MED ORDER — ACETAMINOPHEN 325 MG PO TABS
650.0000 mg | ORAL_TABLET | Freq: Four times a day (QID) | ORAL | Status: DC | PRN
  Administered 2020-02-15: 650 mg via ORAL
  Filled 2020-02-15: qty 2

## 2020-02-15 MED ORDER — LOPERAMIDE HCL 2 MG PO CAPS
2.0000 mg | ORAL_CAPSULE | ORAL | Status: DC | PRN
  Filled 2020-02-15 (×2): qty 1

## 2020-02-15 MED ORDER — THIAMINE HCL 100 MG/ML IJ SOLN
Freq: Every day | INTRAVENOUS | Status: AC
  Filled 2020-02-15 (×3): qty 1000

## 2020-02-15 MED ORDER — METHYLPREDNISOLONE SODIUM SUCC 40 MG IJ SOLR
16.0000 mg | Freq: Three times a day (TID) | INTRAMUSCULAR | Status: AC
  Administered 2020-02-15 – 2020-02-18 (×9): 16 mg via INTRAVENOUS
  Filled 2020-02-15 (×10): qty 1

## 2020-02-15 MED ORDER — ENSURE ENLIVE PO LIQD
237.0000 mL | Freq: Two times a day (BID) | ORAL | Status: DC
  Administered 2020-02-16: 237 mL via ORAL

## 2020-02-15 MED ORDER — DIPHENHYDRAMINE HCL 50 MG/ML IJ SOLN
50.0000 mg | Freq: Every evening | INTRAMUSCULAR | Status: DC | PRN
  Administered 2020-02-15 – 2020-02-16 (×2): 50 mg via INTRAVENOUS
  Filled 2020-02-15 (×2): qty 1

## 2020-02-15 NOTE — Progress Notes (Signed)
Patient transferred from 346 to Obs 2 for NST. Patient reports good fetal movement. Denies leaking of fluid, vaginal bleeding, and contractions. EFM applied. Initial FHT noted at 141bpm.

## 2020-02-15 NOTE — Consult Note (Signed)
[facility] GI Inpatient Consult Note   Jamey Reas, M.D.  Reason for Consult: Persistent nausea and vomiting   Attending Requesting Consult: Farrel Conners, CNM  Outpatient Primary Physician: Adelene Idler, M.D.  History of Present Illness: Madison Mack is a 24 y.o. female with history of recurrent nausea and vomiting currently at 30w gestation with her second baby. Patient reports no significant NSAID use or alcohol. The OB team including Ms. Sharen Hones has tried reglan and zofran and recently began a steroid protocol for hyperemesis which has yet to be fully instituted. Patient has workup that has been negative. Normal GB on U/S, normal LFT's. No hemetemesis, melena. Patient admits to "occasional" use of marijuana both before and during pregnancy, though she qualifies that she quit "after early pregnancy". She is eating some solid food but is having some continued postprandial vomiting of that.   Past Medical History:  Past Medical History:  Diagnosis Date  . Allergy   . Anxiety   . HA (headache)   . Nausea & vomiting     Problem List: Patient Active Problem List   Diagnosis Date Noted  . Hyperemesis gravidarum 02/14/2020  . [redacted] weeks gestation of pregnancy 02/13/2020  . Nausea and vomiting during pregnancy 01/25/2020  . Rh negative state in antepartum period 09/06/2019  . Supervision of high risk pregnancy, antepartum 08/29/2019  . Obesity affecting pregnancy in first trimester 08/29/2019  . BMI 37.0-37.9, adult 08/29/2019  . Hyperemesis affecting pregnancy, antepartum 08/22/2019  . Intractable vomiting   . Barrett's esophagus without dysplasia   . MDD (major depressive disorder), recurrent episode, mild (HCC) 09/08/2018  . Cyclic vomiting syndrome 09/08/2018  . ADD (attention deficit disorder) 09/08/2018    Past Surgical History: Past Surgical History:  Procedure Laterality Date  . ANKLE ARTHODESIS W/ ARTHROSCOPY Right   . ANKLE FRACTURE  SURGERY Right 04/08  . ESOPHAGOGASTRODUODENOSCOPY (EGD) WITH PROPOFOL N/A 08/10/2019   Procedure: ESOPHAGOGASTRODUODENOSCOPY (EGD) WITH PROPOFOL;  Surgeon: Pasty Spillers, MD;  Location: [facility] ENDOSCOPY;  Service: Endoscopy;  Laterality: N/A;    Allergies: Allergies  Allergen Reactions  . Penicillins Rash    Has patient had a PCN reaction causing immediate rash, facial/tongue/throat swelling, SOB or lightheadedness with hypotension: SWELLING FEET AND HANDS, HIVES Has patient had a PCN reaction causing severe rash involving mucus membranes or skin necrosis: NO Has patient had a PCN reaction that required hospitalization: NO Has patient had a PCN reaction occurring within the last 10 years: NO If all of the above answers are "NO", then may proceed with Cephalosporin use.   Marland Kitchen Amoxicillin   . Doxycycline Hives    Home Medications: Medications Prior to Admission  Medication Sig Dispense Refill Last Dose  . omeprazole (PRILOSEC) 20 MG capsule Take 20 mg by mouth daily.   02/14/2020 at Unknown time  . Prenatal Vit-Fe Fumarate-FA (MULTIVITAMIN-PRENATAL) 27-0.8 MG TABS tablet Take 1 tablet by mouth daily at 12 noon.   02/14/2020 at Unknown time  . promethazine (PHENERGAN) 25 MG suppository Place 1 suppository (25 mg total) rectally every 8 (eight) hours as needed for nausea or vomiting. 12 each 0 02/14/2020 at Unknown time  . sertraline (ZOLOFT) 50 MG tablet Take 1 tablet (50 mg total) by mouth daily. 30 tablet 2 02/14/2020 at Unknown time  . albuterol (VENTOLIN HFA) 108 (90 Base) MCG/ACT inhaler Inhale 2 puffs into the lungs every 6 (six) hours as needed for wheezing or shortness of breath. (Patient not taking: Reported on 02/09/2020) 8 g 0   .  diphenhydrAMINE (BENADRYL) 25 MG tablet Take 25 mg by mouth every 6 (six) hours as needed.  (Patient not taking: Reported on 02/09/2020)     . scopolamine (TRANSDERM-SCOP, 1.5 MG,) 1 MG/3DAYS Place 1 patch (1.5 mg total) onto the skin every 3 (three) days. 10 patch  3    Home medication reconciliation was completed with the patient.   Scheduled Inpatient Medications:   . famotidine  20 mg Oral Q12H  . methylPREDNISolone (SOLU-MEDROL) injection  16 mg Intravenous Q8H  . metoCLOPramide  10 mg Oral Q6H   Or  . metoCLOPramide (REGLAN) injection  10 mg Intravenous Q6H    Continuous Inpatient Infusions:   . famotidine (PEPCID) IV 20 mg (02/15/20 1158)  . lactated ringers 125 mL/hr at 02/15/20 0918  . ondansetron (ZOFRAN) IV    . banana bag IV 1000 mL 125 mL/hr at 02/15/20 1340    PRN Inpatient Medications:  hydrOXYzine **OR** hydrOXYzine, loperamide, ondansetron **OR** ondansetron (ZOFRAN) IV **OR** ondansetron (ZOFRAN) IV, promethazine **OR** promethazine  Family History: family history includes ADD / ADHD in her maternal aunt; Alcohol abuse in her maternal aunt and maternal uncle; Anxiety disorder in her maternal aunt and paternal grandfather; Dementia in her paternal grandmother; Depression in her paternal grandfather; Drug abuse in her maternal aunt and maternal uncle; Hyperlipidemia in her father; Hypertension in her mother.   GI Family History: Negative  Social History:   reports that she has never smoked. She has never used smokeless tobacco. She reports that she does not drink alcohol and does not use drugs. The patient denies ETOH, tobacco, or drug use.    Review of Systems: Review of Systems - Negative except that in HPI  Physical Examination: BP (!) 123/54 (BP Location: Right Arm) Comment: nurse Lodema Pilot notified  Pulse 75   Temp 98.8 F (37.1 C) (Oral)   Resp 18   Ht 5\' 8"  (1.727 m)   Wt 114.8 kg   LMP 07/16/2019 (Exact Date)   SpO2 97%   BMI 38.48 kg/m  Physical Exam Vitals and nursing note reviewed.  Constitutional:      Appearance: Normal appearance. She is obese. She is not ill-appearing or diaphoretic.  HENT:     Head: Normocephalic and atraumatic.     Nose: Nose normal.  Eyes:     Extraocular Movements:  Extraocular movements intact.     Conjunctiva/sclera: Conjunctivae normal.     Pupils: Pupils are equal, round, and reactive to light.  Cardiovascular:     Rate and Rhythm: Regular rhythm. Tachycardia present.     Pulses: Normal pulses.  Pulmonary:     Effort: Pulmonary effort is normal.     Breath sounds: Normal breath sounds.  Abdominal:     General: There is distension.     Comments: Distension compatible with intrauterine pregnancy  Musculoskeletal:        General: Normal range of motion.  Skin:    General: Skin is warm.  Neurological:     General: No focal deficit present.     Mental Status: She is alert.  Psychiatric:        Mood and Affect: Mood normal.     Data: Lab Results  Component Value Date   WBC 10.4 02/14/2020   HGB 11.5 (L) 02/14/2020   HCT 32.8 (L) 02/14/2020   MCV 84.3 02/14/2020   PLT 210 02/14/2020   Recent Labs  Lab 02/14/20 2119  HGB 11.5*   Lab Results  Component Value Date  NA 137 02/14/2020   K 3.5 02/14/2020   CL 107 02/14/2020   CO2 21 (L) 02/14/2020   BUN 7 02/14/2020   CREATININE 0.52 02/14/2020   Lab Results  Component Value Date   ALT 19 02/14/2020   AST 15 02/14/2020   ALKPHOS 73 02/14/2020   BILITOT 1.1 02/14/2020   No results for input(s): APTT, INR, PTT in the last 168 hours. CBC Latest Ref Rng & Units 02/14/2020 02/01/2020 01/25/2020  WBC 4.0 - 10.5 K/uL 10.4 9.0 8.3  Hemoglobin 12.0 - 15.0 g/dL 11.5(L) 12.6 12.2  Hematocrit 36 - 46 % 32.8(L) 39.2 36.4  Platelets 150 - 400 K/uL 210 220 243    STUDIES: US Abdomen Limited RUQ  Result Date: 02/15/2020 CLINICAL DATA:  Nausea, vomiting. EXAM: ULTRASOUND ABDOMEN LIMITED RIGHT UPPER QUADRANT COMPARISON:  None. FINDINGS: Gallbladder: No gallstones or wall thickening visualized. No sonographic Murphy sign noted by sonographer. Common bile duct: Diameter: 2 mm which is within normal limits. Liver: No focal lesion identified. Within normal limits in parenchymal echogenicity. Portal  vein is patent on color Doppler imaging with normal direction of blood flow towards the liver. Other: None. IMPRESSION: No definite abnormality seen in the right upper quadrant of the abdomen. Electronically Signed   By: Lupita Raider M.D.   On: 02/15/2020 11:53   @IMAGES @  Assessment:  1. Chronic recurrent nausea and vomiting - Worse with pregnancy compared to non-pregnant state. 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   COVID-19 status: Tested negative.  Recommendations:  1. Continue with initiation of steroid protocol for nausea. 2. Will add Benadryl 50mg  IV q HS for nausea and to avoid hypersalivation associated with nausea. 3. I advised patient to completely stop marijuana use to avoid these symptoms. 4. Drop to liquid diet in the interim to assist with easier gastric emptying.   Thank you for the consult. Please call with questions or concerns.  , " MD [facility] [address] [place], [address] [place] [phone]  02/15/2020 4:23 PM

## 2020-02-15 NOTE — Progress Notes (Addendum)
Admission Date: 02/14/2020 Today's Date: 02/15/2020  Persistent nausea and vomiting with pregnancy and loose stools x 2 days  S: Still sees stars, feels unsteady/ shaky, lightheaded at times.  Had 3 more watery stool this Am before 0700. Complains of acid reflux. No vomiting since yesterday-has been NPO except for ice chips. Baby moving OK  O: BP 117/68 (BP Location: Right Arm)   Pulse 75   Temp 98.5 F (36.9 C) (Oral)   Resp 18   Ht 5\' 8"  (1.727 m)   Wt 114.8 kg   LMP 07/16/2019 (Exact Date)   SpO2 97%   BMI 38.48 kg/m    General: appears tired, in NAD  Heart: RRR without murmur Lungs: CTAB, normal respiratory effort  Abdomen: gravid, mild RUQ tenderness with palpation, soft, NABS  Extremities: no calf tenderness  Skin: normal turgor  Neuro: alert and awake, answering questions appropriately  Results for orders placed or performed during the hospital encounter of 02/14/20 (from the past 24 hour(s))  Comprehensive metabolic panel     Status: Abnormal   Collection Time: 02/14/20  9:19 PM  Result Value Ref Range   Sodium 137 135 - 145 mmol/L   Potassium 3.5 3.5 - 5.1 mmol/L   Chloride 107 98 - 111 mmol/L   CO2 21 (L) 22 - 32 mmol/L   Glucose, Bld 80 70 - 99 mg/dL   BUN 7 6 - 20 mg/dL   Creatinine, Ser 04/16/20 0.44 - 1.00 mg/dL   Calcium 8.6 (L) 8.9 - 10.3 mg/dL   Total Protein 6.6 6.5 - 8.1 g/dL   Albumin 3.0 (L) 3.5 - 5.0 g/dL   AST 15 15 - 41 U/L   ALT 19 0 - 44 U/L   Alkaline Phosphatase 73 38 - 126 U/L   Total Bilirubin 1.1 0.3 - 1.2 mg/dL   GFR calc non Af Amer >60 >60 mL/min   GFR calc Af Amer >60 >60 mL/min   Anion gap 9 5 - 15  CBC     Status: Abnormal   Collection Time: 02/14/20  9:19 PM  Result Value Ref Range   WBC 10.4 4.0 - 10.5 K/uL   RBC 3.89 3.87 - 5.11 MIL/uL   Hemoglobin 11.5 (L) 12.0 - 15.0 g/dL   HCT 04/16/20 (L) 36 - 46 %   MCV 84.3 80.0 - 100.0 fL   MCH 29.6 26.0 - 34.0 pg   MCHC 35.1 30.0 - 36.0 g/dL   RDW 35.5 73.2 - 20.2 %   Platelets 210  150 - 400 K/uL   nRBC 0.0 0.0 - 0.2 %  Magnesium     Status: None   Collection Time: 02/14/20  9:19 PM  Result Value Ref Range   Magnesium 1.8 1.7 - 2.4 mg/dL  Glucose, capillary     Status: None   Collection Time: 02/15/20  5:57 AM  Result Value Ref Range   Glucose-Capillary 76 70 - 99 mg/dL   A: IUP at 04/17/20 gestation with persistent nausea and vomiting in pregnancy Had problems with nausea and vomiting prior to pregnancy  P: Limited RUQ ultrasound today. Add steroids: Medrol 16 mgm q8hrs x 2-3 days, followed by taper Consult [facility] after abdominal ultrasound back Consider MFM consult tomorrow Continue banana bag daily and IV hydration. Consider starting BRAT diet/ clear liquids after ultrasound Stool specimen for C difficle  70WC, CNM  Abdominal ultrasound was WNL. Will start on BRAT diet/ liquids Contacted Dr [phone] via secure chat regarding GI consult,  but not on call. Secure chatted Dr Norma Fredrickson regarding the consult.

## 2020-02-15 NOTE — Telephone Encounter (Signed)
Pt is currently admitted to [facility].

## 2020-02-16 DIAGNOSIS — F1210 Cannabis abuse, uncomplicated: Secondary | ICD-10-CM

## 2020-02-16 DIAGNOSIS — R110 Nausea: Secondary | ICD-10-CM

## 2020-02-16 DIAGNOSIS — R197 Diarrhea, unspecified: Secondary | ICD-10-CM

## 2020-02-16 DIAGNOSIS — O99323 Drug use complicating pregnancy, third trimester: Secondary | ICD-10-CM

## 2020-02-16 MED ORDER — LACTATED RINGERS IV BOLUS
500.0000 mL | Freq: Once | INTRAVENOUS | Status: AC
  Administered 2020-02-16: 500 mL via INTRAVENOUS

## 2020-02-16 NOTE — Progress Notes (Signed)
Subjective: Patient reports nausea.   But no vominting this morning. This 24 yo G2 P1 at 30 weeks 1day Gestation, was admitted to the Postpartum floor after several hospital visits pertaining to her ongoing struggles with hyperemesis. She has been unable to tolerate solid foods, has had daily N/V, and was not responding to Zofran, Diclegis, or phenergan.  She has received IV fluid hydration , and after consultation with GI, is now on a hyperemesis protocol. She is feeling much better today, and has started on a liquid diet. She was able to take in some broth this morning. She would like to move to solid food this afternoon.  Obstetrically, her baby is moving well, and she denies contractions, vaginal bleeding, or LOF.  Objective: I have reviewed patient's vital signs, medications and previous provider notes.  General: alert, cooperative, appears stated age and moderately obese Resp: clear to auscultation bilaterally Cardio: regular rate and rhythm, S1, S2 normal, no murmur, click, rub or gallop GI: soft, non-tender; bowel sounds normal; no masses,  no organomegaly Extremities: extremities normal, atraumatic, no cyanosis or edema Reactive NST  Assessment/Plan: IUP 30 weeks 1 day Hyperemesis vs cyclic vomiting syndrome Reassuring FHTS  Continue present orders and plan of care. Consider Discharge tomorrow.   Madison Mack 02/16/2020, 11:35 AM

## 2020-02-16 NOTE — Consult Note (Signed)
Maternal Fetal Medicine Inpatient Consultation  Date of service: 02/16/20 Reason for request: Nausea and vomiting in pregnancy Requesting provider: Farrel Conners, CNM  Madison Mack is a 24 yo G2P1 at 67 w 1 d who is admitted to the hospital due to persistent nauseas, vomiting and now diarrhea that began on Tuesday.  Her pregnancy is desired. She denies signs and symptoms of preterm labor or preeclampsia.    Maternal issues:  1) Pre-pregnancy marijuana use that she said she used on days where she felt poorly. She said she has been using MJ for >1.5 years approximately 1-2 times per week.  2) Pre pregnancy GERD and esophagitis with cyclical nausea and vomiting. She notes that she has had N/V symptoms prior to pregnancy intermittently, with an uncertain frequency. She notes that these symptoms have occurred for > 1 year prior to pregnancy.  3) Nausea and Vomiting in pregnancy current: She notes that she has had symptoms since the time of pregnancy but it has worsened in the last week. On Monday she felt that should couldn't hold anything down and on Tuesday morning diarrhea began. She said her urine and stools have been orange.   She has been treated with various antiemetics with various temporary relief. She has received IV hydration and now on a steroid taper which has improved her symptoms. Per the nurse her stool is brown and she has not had a N/V episode in two days. She is holding down some food equivalent to > 1000 cal per day. Her electrolytes are being replaced. Her overall work up has been normal.  A GI consultation was performed suggesting MJ induced cyclical vomiting and to continue steroid taper while cessation of MJ use.    Fetal Issues: Normally growth fetus on 02/09/20 with EFW of 57.7% of 1412 g. Good fetal movements and amniotic fluid.   Vitals with BMI 02/16/2020 02/16/2020 02/16/2020  Height - - -  Weight - 249 lbs 9 oz -  BMI - 37.95 -  Systolic 135 - 118  Diastolic 75 -  62  Pulse 88 - 74  Some encounter information is confidential and restricted. Go to Review Flowsheets activity to see all data.   CBC Latest Ref Rng & Units 02/14/2020 02/01/2020 01/25/2020  WBC 4.0 - 10.5 K/uL 10.4 9.0 8.3  Hemoglobin 12.0 - 15.0 g/dL 11.5(L) 12.6 12.2  Hematocrit 36 - 46 % 32.8(L) 39.2 36.4  Platelets 150 - 400 K/uL 210 220 243   CMP Latest Ref Rng & Units 02/14/2020 01/25/2020 01/15/2020  Glucose 70 - 99 mg/dL 80 76 85  BUN 6 - 20 mg/dL 7 8 7   Creatinine 0.44 - 1.00 mg/dL 9.47 0.96  Sodium 135 - 145 mmol/L 137 136 137  Potassium 3.5 - 5.1 mmol/L 3.5 3.7 3.8  Chloride 98 - 111 mmol/L 107 105 108  CO2 22 - 32 mmol/L 21(L) 21(L) 20(L)  Calcium 8.9 - 10.3 mg/dL 2.83) 6.6(Q) 9.1  Total Protein 6.5 - 8.1 g/dL 6.6 7.0 -  Total Bilirubin 0.3 - 1.2 mg/dL 1.1 9.4(T) -  Alkaline Phos 38 - 126 U/L 73 82 -  AST 15 - 41 U/L 15 25 -  ALT 0 - 44 U/L 19 28 -   OB History  Gravida Para Term Preterm AB Living  2 1 1     1   SAB TAB Ectopic Multiple Live Births          1    # Outcome Date GA Lbr Len/2nd Weight  Sex Delivery Anes PTL Lv  2 Current           1 Term 10/23/16 [redacted]w[redacted]d  3572 g M Vag-Spont  N LIV   Past Medical History:  Diagnosis Date  . Allergy   . Anxiety   . HA (headache)   . Nausea & vomiting    Past Surgical History:  Procedure Laterality Date  . ANKLE ARTHODESIS W/ ARTHROSCOPY Right   . ANKLE FRACTURE SURGERY Right 04/08  . ESOPHAGOGASTRODUODENOSCOPY (EGD) WITH PROPOFOL N/A 08/10/2019   Procedure: ESOPHAGOGASTRODUODENOSCOPY (EGD) WITH PROPOFOL;  Surgeon: Pasty Spillers, MD;  Location: [facility] ENDOSCOPY;  Service: Endoscopy;  Laterality: N/A;   Family History  Problem Relation Age of Onset  . Hyperlipidemia Father   . Hypertension Mother   . ADD / ADHD Maternal Aunt   . Alcohol abuse Maternal Aunt   . Drug abuse Maternal Aunt   . Anxiety disorder Maternal Aunt   . Alcohol abuse Maternal Uncle   . Drug abuse Maternal Uncle   . Anxiety disorder  Paternal Grandfather   . Depression Paternal Grandfather   . Dementia Paternal Grandmother    Social History   Socioeconomic History  . Marital status: Married    Spouse name: Madison Mack  . Number of children: 1  . Years of education: Not on file  . Highest education level: Bachelor's degree (e.g., BA, AB, BS)  Occupational History  . 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 Smoking status: Never Smoker  . Smokeless tobacco: Never Used  Vaping Use  . Vaping Use: Never used  Substance and Sexual Activity  . Alcohol use: No    Alcohol/week: 0.0 standard drinks  . Drug use: No  . Sexual activity: Yes    Birth control/protection: I.U.D.  Other Topics Concern  . Not on file  Social History Narrative  . Not on file   Social Determinants of Health   Financial Resource Strain:   . Difficulty of Paying Living Expenses:   Food Insecurity:   . Worried About Programme researcher, broadcasting/film/video in the Last Year:   . Barista in the Last Year:   Transportation Needs:   . Freight forwarder (Medical):   Marland Kitchen Lack of Transportation (Non-Medical):   Physical Activity:   . Days of Exercise per Week:   . Minutes of Exercise per Session:   Stress:   . Feeling of Stress :   Social Connections:   . Frequency of Communication with Friends and Family:   . Frequency of Social Gatherings with Friends and Family:   . Attends Religious Services:   . Active Member of Clubs or Organizations:   . Attends Banker Meetings:   Marland Kitchen Marital Status:   Intimate Partner Violence:   . Fear of Current or Ex-Partner:   . Emotionally Abused:   Marland Kitchen Physically Abused:   . Sexually Abused:    Allergies  Allergen Reactions  . Penicillins Rash    Has patient had a PCN reaction causing immediate rash, facial/tongue/throat swelling, SOB or lightheadedness with hypotension: SWELLING FEET AND HANDS, HIVES Has patient had a PCN reaction causing severe rash  involving mucus membranes or skin necrosis: NO Has patient had a PCN reaction that required hospitalization: NO Has patient had a PCN reaction occurring within the last 10 years: NO If all of the above answers are "NO", then may proceed with Cephalosporin use.   Marland Kitchen Amoxicillin   .  Doxycycline Hives    Impression/Counseling:  Nausea and vomiting in pregnancy. Madison Mack appears to be improving with now being able to retain her meals. She has some mild diarrhea per the nurse. In addition, Madison Mack is desired solid foods where she hadn't desired solid foods previously. She believes the steroid taper is having a positive effect.  There is an expressed concern in the referral regarding when to initiate enteral feeding. This decision is a clinical in nature, which include  maternal symptoms ( dehydration, weight loss, and tachycardia and signs of malnutrition), daily calorie intake, and failed prior medical therapies.  At this time it appears the steroid taper is improving her overall symptoms and she is retaining >1000k per day. I would recommend continuing to advance her diet with liquid/soft nutrients (smoothies, broth etc).   With regards to her diarrhea it uncertain of the cause her cdiff panel is normal. Submit for bacterial culture continue fluid and electrolyte replacement as per GI consultation.  I spent 30 minutes with > 50% in face to face consultation and care coordination.   Novella Olive,  MD.

## 2020-02-16 NOTE — Progress Notes (Signed)
Pt called out for nurse to come to room.  Getting ready to get up to bathroom and felt weak and shaky.  Returned to bed. VSS. Gave pt saltines and AJ.  Pt ate 4 crackers and drank all of juice.  tol well, felt better.  Dr. Grace Bushy, MFM, at bedside for consult.

## 2020-02-16 NOTE — Progress Notes (Signed)
GI Inpatient Follow-up Note  Subjective:  Patient seen in follow-up for chronic recurrent nausea and vomiting in pregnancy. No acute events overnight. She had steroid taper instituted and Benadryl IV was added last night. She has been improving and has been able to retain some of her meals. She had some vegetable broth for lunch. She does report some mild nausea without any recurrent episodes of emesis. She denies abdominal pain. She has had diarrhea since Tuesday. No sick contacts. Appx 6 loose, watery BMs since yesterday without any gross hematochezia or melena.   Scheduled Inpatient Medications:  . famotidine  20 mg Oral Q12H  . feeding supplement (ENSURE ENLIVE)  237 mL Oral BID BM  . methylPREDNISolone (SOLU-MEDROL) injection  16 mg Intravenous Q8H  . metoCLOPramide  10 mg Oral Q6H   Or  . metoCLOPramide (REGLAN) injection  10 mg Intravenous Q6H    Continuous Inpatient Infusions:   . famotidine (PEPCID) IV 20 mg (02/16/20 0956)  . lactated ringers 125 mL/hr at 02/16/20 0615  . ondansetron (ZOFRAN) IV    . banana bag IV 1000 mL 125 mL/hr at 02/16/20 1202    PRN Inpatient Medications:  acetaminophen, diphenhydrAMINE, loperamide, ondansetron **OR** ondansetron (ZOFRAN) IV **OR** ondansetron (ZOFRAN) IV, promethazine **OR** promethazine  Review of Systems: Constitutional: Weight is stable.  Eyes: No changes in vision. ENT: No oral lesions, sore throat.  GI: see HPI.  Heme/Lymph: No easy bruising.  CV: No chest pain.  GU: No hematuria.  Integumentary: No rashes.  Neuro: No headaches.  Psych: No depression/anxiety.  Endocrine: No heat/cold intolerance.  Allergic/Immunologic: No urticaria.  Resp: No cough, SOB.  Musculoskeletal: No joint swelling.    Physical Examination: BP 124/66 (BP Location: Right Arm)   Pulse 79   Temp 98.6 F (37 C) (Oral)   Resp 18   Ht 5\' 8"  (1.727 m)   Wt 113.2 kg   LMP 07/16/2019 (Exact Date)   SpO2 98%   BMI 37.95 kg/m  Gen: NAD, alert  and oriented x 4 HEENT: PEERLA, EOMI, Neck: supple, no JVD or thyromegaly Chest: CTA bilaterally, no wheezes, crackles, or other adventitious sounds CV: RRR, no m/g/c/r Abd: soft, NT, ND, +BS in all four quadrants; no HSM, guarding, ridigity, or rebound tenderness Ext: no edema, well perfused with 2+ pulses, Skin: no rash or lesions noted Lymph: no LAD  Data: Lab Results  Component Value Date   WBC 10.4 02/14/2020   HGB 11.5 (L) 02/14/2020   HCT 32.8 (L) 02/14/2020   MCV 84.3 02/14/2020   PLT 210 02/14/2020   Recent Labs  Lab 02/14/20 2119  HGB 11.5*   Lab Results  Component Value Date   NA 137 02/14/2020   K 3.5 02/14/2020   CL 107 02/14/2020   CO2 21 (L) 02/14/2020   BUN 7 02/14/2020   CREATININE 0.52 02/14/2020   Lab Results  Component Value Date   ALT 19 02/14/2020   AST 15 02/14/2020   ALKPHOS 73 02/14/2020   BILITOT 1.1 02/14/2020   No results for input(s): APTT, INR, PTT in the last 168 hours.  Assessment:  1. Chronic recurrent nausea and vomiting - Worse with pregnancy compared to non-pregnant state. 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   COVID-19 status:        Tested negative.  Recommendations:  1. Continue steroid protocol for nausea/hyperemesis gravidarum  2. Continue  Benadryl 50 mg IV qHS  3. She is improving clinically 4. Advance diet as tolerated 5. No indication for any further GI work-up 6. Anticipate discharge tomorrow if she is able to continue to advance her diet 7. Discussed importance of complete marijuana cessation  Please call with questions or concerns.   Jacob Moores, PA-C [facility] [phone] (Cell)

## 2020-02-16 NOTE — Progress Notes (Signed)
Patient called RN into room c/o contractions.  Pt stated cramping was in lower abdomen and lower back, she stated that she had felt 3 in the last 30 minutes.  RN palpated abdomen and it was firm during stated contraction.  Farrel Conners, CNM notified.

## 2020-02-17 LAB — CBC WITH DIFFERENTIAL/PLATELET
Abs Immature Granulocytes: 0.03 10*3/uL (ref 0.00–0.07)
Basophils Absolute: 0 10*3/uL (ref 0.0–0.1)
Basophils Relative: 0 %
Eosinophils Absolute: 0 10*3/uL (ref 0.0–0.5)
Eosinophils Relative: 0 %
HCT: 34.4 % — ABNORMAL LOW (ref 36.0–46.0)
Hemoglobin: 11.6 g/dL — ABNORMAL LOW (ref 12.0–15.0)
Immature Granulocytes: 0 %
Lymphocytes Relative: 17 %
Lymphs Abs: 1.5 10*3/uL (ref 0.7–4.0)
MCH: 29.6 pg (ref 26.0–34.0)
MCHC: 33.7 g/dL (ref 30.0–36.0)
MCV: 87.8 fL (ref 80.0–100.0)
Monocytes Absolute: 0.5 10*3/uL (ref 0.1–1.0)
Monocytes Relative: 5 %
Neutro Abs: 6.8 10*3/uL (ref 1.7–7.7)
Neutrophils Relative %: 78 %
Platelets: 185 10*3/uL (ref 150–400)
RBC: 3.92 MIL/uL (ref 3.87–5.11)
RDW: 14.6 % (ref 11.5–15.5)
WBC: 8.8 10*3/uL (ref 4.0–10.5)
nRBC: 0 % (ref 0.0–0.2)

## 2020-02-17 LAB — URINE DRUG SCREEN, QUALITATIVE (ARMC ONLY)
Amphetamines, Ur Screen: NOT DETECTED
Barbiturates, Ur Screen: NOT DETECTED
Benzodiazepine, Ur Scrn: NOT DETECTED
Cannabinoid 50 Ng, Ur ~~LOC~~: POSITIVE — AB
Cocaine Metabolite,Ur ~~LOC~~: NOT DETECTED
MDMA (Ecstasy)Ur Screen: NOT DETECTED
Methadone Scn, Ur: NOT DETECTED
Opiate, Ur Screen: NOT DETECTED
Phencyclidine (PCP) Ur S: NOT DETECTED
Tricyclic, Ur Screen: NOT DETECTED

## 2020-02-17 LAB — COMPREHENSIVE METABOLIC PANEL
ALT: 31 U/L (ref 0–44)
AST: 22 U/L (ref 15–41)
Albumin: 3 g/dL — ABNORMAL LOW (ref 3.5–5.0)
Alkaline Phosphatase: 70 U/L (ref 38–126)
Anion gap: 7 (ref 5–15)
BUN: <5 mg/dL — ABNORMAL LOW (ref 6–20)
CO2: 22 mmol/L (ref 22–32)
Calcium: 8.6 mg/dL — ABNORMAL LOW (ref 8.9–10.3)
Chloride: 109 mmol/L (ref 98–111)
Creatinine, Ser: 0.51 mg/dL (ref 0.44–1.00)
GFR calc Af Amer: >60 mL/min (ref >60)
GFR calc non Af Amer: >60 mL/min (ref >60)
Glucose, Bld: 97 mg/dL (ref 70–99)
Potassium: 3.5 mmol/L (ref 3.5–5.1)
Sodium: 138 mmol/L (ref 135–145)
Total Bilirubin: 0.7 mg/dL (ref 0.3–1.2)
Total Protein: 6.8 g/dL (ref 6.5–8.1)

## 2020-02-17 MED ORDER — SERTRALINE HCL 25 MG PO TABS
50.0000 mg | ORAL_TABLET | Freq: Every day | ORAL | Status: DC
  Administered 2020-02-17 – 2020-02-18 (×2): 50 mg via ORAL
  Filled 2020-02-17 (×2): qty 2

## 2020-02-17 MED ORDER — HYDROXYZINE HCL 25 MG PO TABS
25.0000 mg | ORAL_TABLET | Freq: Three times a day (TID) | ORAL | Status: DC | PRN
  Administered 2020-02-17 – 2020-02-18 (×2): 25 mg via ORAL
  Filled 2020-02-17 (×3): qty 1

## 2020-02-17 NOTE — Progress Notes (Signed)
GI Inpatient Follow-up Note  Subjective:  Patient seen in follow-up for chronic recurrent nausea and vomiting in pregnancy. Overnight she developed some recurrent nausea and vomiting. Diet was decreased back to clears. She reports two episodes of emesis this morning. She has no appetite and no desire to eat. She is looking at food menu for dinner. She denies any new complaints such as fever, abdominal pain, dysphagia, or rectal bleeding.   Scheduled Inpatient Medications:  . famotidine  20 mg Oral Q12H  . feeding supplement (ENSURE ENLIVE)  237 mL Oral BID BM  . methylPREDNISolone (SOLU-MEDROL) injection  16 mg Intravenous Q8H  . metoCLOPramide  10 mg Oral Q6H   Or  . metoCLOPramide (REGLAN) injection  10 mg Intravenous Q6H  . sertraline  50 mg Oral Daily    Continuous Inpatient Infusions:   . famotidine (PEPCID) IV 20 mg (02/17/20 1016)  . lactated ringers 125 mL/hr at 02/17/20 0556  . ondansetron (ZOFRAN) IV    . banana bag IV 1000 mL 125 mL/hr at 02/17/20 1055    PRN Inpatient Medications:  acetaminophen, diphenhydrAMINE, loperamide, ondansetron **OR** ondansetron (ZOFRAN) IV **OR** ondansetron (ZOFRAN) IV, promethazine **OR** promethazine  Review of Systems: Constitutional: Weight is stable.  Eyes: No changes in vision. ENT: No oral lesions, sore throat.  GI: see HPI.  Heme/Lymph: No easy bruising.  CV: No chest pain.  GU: No hematuria.  Integumentary: No rashes.  Neuro: No headaches.  Psych: No depression/anxiety.  Endocrine: No heat/cold intolerance.  Allergic/Immunologic: No urticaria.  Resp: No cough, SOB.  Musculoskeletal: No joint swelling.    Physical Examination: BP 121/65 (BP Location: Right Arm)   Pulse 83   Temp 99 F (37.2 C) (Oral)   Resp 18   Ht 5\' 8"  (1.727 m)   Wt 112 kg   LMP 07/16/2019 (Exact Date)   SpO2 97%   BMI 37.56 kg/m  Gen: NAD, alert and oriented x 4 HEENT: PEERLA, EOMI, Neck: supple, no JVD or thyromegaly Chest: CTA  bilaterally, no wheezes, crackles, or other adventitious sounds CV: RRR, no m/g/c/r Abd: soft, NT, ND, +BS in all four quadrants; no HSM, guarding, ridigity, or rebound tenderness Ext: no edema, well perfused with 2+ pulses, Skin: no rash or lesions noted Lymph: no LAD  Data: Lab Results  Component Value Date   WBC 8.8 02/17/2020   HGB 11.6 (L) 02/17/2020   HCT 34.4 (L) 02/17/2020   MCV 87.8 02/17/2020   PLT 185 02/17/2020   Recent Labs  Lab 02/14/20 2119 02/17/20 1151  HGB 11.5* 11.6*   Lab Results  Component Value Date   NA 138 02/17/2020   K 3.5 02/17/2020   CL 109 02/17/2020   CO2 22 02/17/2020   BUN <5 (L) 02/17/2020   CREATININE 0.51 02/17/2020   Lab Results  Component Value Date   ALT 31 02/17/2020   AST 22 02/17/2020   ALKPHOS 70 02/17/2020   BILITOT 0.7 02/17/2020   No results for input(s): APTT, INR, PTT in the last 168 hours.   Assessment:  1. Chronic recurrent nausea and vomiting - Worse with pregnancy compared to non-pregnant state. 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  COVID-19 status:Tested negative.  Recommendations:  1. Continue symptomatic treatment 2. Continue steroid taper and Benadryl 50 mg IV qHS 3. Advance diet as tolerated 4. No plan for endoscopic evaluation 5. Dr. 04/19/2020 will  be following over the weekend 6. UDS pending  Please call with questions or concerns.    Jacob Moores, PA-C [facility] [phone] (Cell)

## 2020-02-17 NOTE — Progress Notes (Signed)
UDS specimen sent to lab.

## 2020-02-17 NOTE — Progress Notes (Signed)
S: Patient reports she was feeling well enough to eat yesterday, and then overnight her nausea and vomiting increased. She has not had anything to eat yet this morning for fear of vomiting. She speculates that she may have "overdone" it yesterday since she was beginning to feel better and was hungry. She reports positive fetal movement although not as much this morning.   O: Vital Signs: BP 131/80 (BP Location: Right Arm)   Pulse 78   Temp 98.7 F (37.1 C) (Oral)   Resp 18   Ht 5\' 8"  (1.727 m)   Wt 112 kg   LMP 07/16/2019 (Exact Date)   SpO2 97%   BMI 37.56 kg/m  Constitutional: Well nourished, well developed female in mild distress due to nausea  HEENT: normal Skin: Warm and dry.  Cardiovascular: Regular rate and rhythm  Extremity: no edema  Respiratory: Clear to auscultation bilateral. Normal respiratory effort Abdomen: FHT present Back: no CVAT Neuro: DTRs 2+, Cranial nerves grossly intact Psych: Alert and Oriented x3. No memory deficits. Normal mood and affect  A: 24 y.o. G2 P50 female with IUP at [redacted]w[redacted]d, hyperemesis/cyclical vomiting syndrome  P:  Has been seen by GI, no indication for further GI work up per last note yesterday Steroid taper following 72 hours IV Solu-Medrol Ice chips/clears, advance as tolerated diet Continue IV fluids and antiemetics PRN Daily NST CBC, CMP Disposition: pending progress tolerating PO intake  [redacted]w[redacted]d, CNM [facility] 02/17/2020, 11:35 AM

## 2020-02-17 NOTE — Progress Notes (Addendum)
Spoke with RN for update regarding patient's progress over the day. Patient tolerated some PO intake and also had episode of emesis. Her moods have been labile. We discussed starting zoloft per CNM Fryer's suggestion on 02/14/20.  I visited with the patient who reported dose of zoloft was helpful. I spoke with her regarding depression and anxiety. She has a history of anxiety and previously took prozac. She stopped taking prozac due to pregnancy and thinks her anxiety level has been elevated since then. She also has history of sensitive stomach and GI- easily nauseated as a child and has IBS and hiatal hernia per upper GI scope done previously. She agrees that feeding tube/PICC line are last resorts. I offered her atarax PRN for anxiety while continuing zoloft. She is interested in trying atarax.  We discussed her positive UDS today (marijuana) and she reports last use was mid June. Her anxiety level is increased with positive drug screen since she worries she may lose her children. Reassurance given along with encouragement and recommendation to stop using marijuana. Cyclical Vomiting Syndrome has been associated with daily, long term use of cannabis and with anxiety disorders and depression as well as other possible etiologies. See the following abstracts.   From PubMed.gov Cyclical Vomiting Syndrome: [facility] [place] 1, V Vimal [place] 2, [place] 2 Affiliations expand PMID: [id number] PMCID: [id number] DOI: 10.4103/[id number] [facility] article Abstract Cyclical vomiting syndrome (CVS) is an idiopathic functional disorder characterized by recurrent episodes of nausea and vomiting separated by symptom-free intervals. Even though initially described in children, it is seen in all age groups. Exact etiology is not known. Various physical, infectious, and psychosocial stressors have been implicated for CVS. High incidence of psychiatric comorbidities such as panic attacks,  anxiety disorder, and depression is seen in CVS. Most children outgrow CVS with time though some may transition to migraine or continue to have CVS as adults. Frequent misdiagnosis, delay in diagnosis, or inadequate treatment often lead to years of recurrent vomiting.  And from [facility] Website: Chronic use of marijuana (Cannabis sativa) also has been associated with cyclic vomiting syndrome because some people use marijuana to relieve their nausea. However, chronic marijuana use can lead to a condition called cannabis hyperemesis syndrome, which typically leads to persistent vomiting without normal intervening periods.  We also discussed goals for discharge which include tolerating PO diet and medications. Patient expresses thanks and feels somewhat reassured.  Continue current care advancing diet/PO medications as tolerated  Tresea Mall, CNM 02/17/2020

## 2020-02-18 DIAGNOSIS — F419 Anxiety disorder, unspecified: Secondary | ICD-10-CM

## 2020-02-18 DIAGNOSIS — K219 Gastro-esophageal reflux disease without esophagitis: Secondary | ICD-10-CM

## 2020-02-18 DIAGNOSIS — O210 Mild hyperemesis gravidarum: Principal | ICD-10-CM

## 2020-02-18 DIAGNOSIS — Z3A30 30 weeks gestation of pregnancy: Secondary | ICD-10-CM

## 2020-02-18 LAB — SARS CORONAVIRUS 2 BY RT PCR (HOSPITAL ORDER, PERFORMED IN ~~LOC~~ HOSPITAL LAB): SARS Coronavirus 2: NEGATIVE

## 2020-02-18 MED ORDER — LOPERAMIDE HCL 1 MG/7.5ML PO SUSP
1.0000 mg | Freq: Every day | ORAL | Status: DC | PRN
  Filled 2020-02-18: qty 7.5

## 2020-02-18 MED ORDER — CALCIUM CARBONATE ANTACID 500 MG PO CHEW
1.0000 | CHEWABLE_TABLET | Freq: Four times a day (QID) | ORAL | Status: DC | PRN
  Administered 2020-02-18 – 2020-02-19 (×2): 200 mg via ORAL
  Filled 2020-02-18 (×2): qty 1

## 2020-02-18 MED ORDER — COMPLETENATE 29-1 MG PO CHEW
1.0000 | CHEWABLE_TABLET | Freq: Every day | ORAL | Status: DC
  Filled 2020-02-18 (×2): qty 1

## 2020-02-18 MED ORDER — PREDNISONE 10 MG PO TABS
40.0000 mg | ORAL_TABLET | Freq: Every day | ORAL | Status: AC
  Administered 2020-02-18: 40 mg via ORAL
  Filled 2020-02-18: qty 4

## 2020-02-18 MED ORDER — PREDNISONE 10 MG PO TABS
20.0000 mg | ORAL_TABLET | Freq: Every day | ORAL | Status: DC
  Administered 2020-02-19: 20 mg via ORAL
  Filled 2020-02-18: qty 2

## 2020-02-18 NOTE — Progress Notes (Addendum)
Benign Gynecology Progress Note  Admission Date: 02/14/2020 Current Date: 02/18/2020  Madison Mack is a 24 y.o. G2P1001 HD#5 @ [redacted]w[redacted]d.  History complicated by: Patient Active Problem List   Diagnosis Date Noted  . Hyperemesis gravidarum 02/14/2020  . [redacted] weeks gestation of pregnancy 02/13/2020  . Nausea and vomiting during pregnancy 01/25/2020  . Rh negative state in antepartum period 09/06/2019  . Supervision of high risk pregnancy, antepartum 08/29/2019  . Obesity affecting pregnancy in first trimester 08/29/2019  . BMI 37.0-37.9, adult 08/29/2019  . Hyperemesis affecting pregnancy, antepartum 08/22/2019  . Intractable vomiting   . Barrett's esophagus without dysplasia   . MDD (major depressive disorder), recurrent episode, mild (HCC) 09/08/2018  . Cyclic vomiting syndrome 09/08/2018  . ADD (attention deficit disorder) 09/08/2018   ROS and patient/family/surgical history, located on admission H&P note dated 02/14/2020, have been reviewed, and there are no changes except as noted below  Yesterday/Overnight Events:  Did well yesterday but had 3 episodes n/v during night. Still has diarrhea at times too. Is able to take meds after Reglan/Zofran dosing.  Subjective:  Tired and weak this am. Not having nausea at this time.  Fears taking anything po and triggering nausea again.  Yet she is also motivated for converting to po's and discharge planning.  Expresses she misses her son and is ready some point for d/c.  Objective:   Vitals:   02/18/20 0337 02/18/20 0545 02/18/20 0628 02/18/20 0835  BP: 117/61  139/66 122/63  Pulse: 74  100 82  Resp: 18  19 18   Temp: 98.2 F (36.8 C)  98 F (36.7 C) 98.3 F (36.8 C)  TempSrc: Oral  Oral Oral  SpO2: 99%  98% 97%  Weight:  112.5 kg    Height:        I/O last 3 completed shifts: In: 6767.8 [I.V.:6673.3; IV Piggyback:94.5] Out: 3750 [Urine:3750] Total I/O In: 444.7 [I.V.:444.7] Out: 100 [Urine:100]  Physical exam: General  appearance: alert, cooperative and appears stated age Abdomen: gravid and soft, non-tender; bowel sounds normal; no masses, no organomegaly GU: No gross VB Lungs: clear to auscultation bilaterally Heart: regular rate and rhythm and no MRGs Extremities: no redness or tenderness in the calves or thighs, no edema Skin: no lesions Psych: appropriate  Recent Labs  Lab 02/14/20 2119 02/17/20 1151  NA 137 138  K 3.5 3.5  CL 107 109  CO2 21* 22  BUN 7 <5*  CREATININE 0.52 0.51  GLUCOSE 80 97     Recent Labs  Lab 02/14/20 2119 02/17/20 1151  CALCIUM 8.6* 8.6*  MG 1.8  --        Recent Labs  Lab 02/14/20 2119 02/17/20 1151  ALKPHOS 73 70  BILITOT 1.1 0.7  PROT 6.6 6.8  ALT 19 31  AST 15 22    Recent Labs  Lab 02/14/20 2119 02/17/20 1151  WBC 10.4 8.8  HGB 11.5* 11.6*  HCT 32.8* 34.4*  PLT 210 185   Recent Labs  Lab 02/14/20 2119 02/17/20 1151  NA 137 138  K 3.5 3.5  CL 107 109  CO2 21* 22  BUN 7 <5*  CREATININE 0.52 0.51  CALCIUM 8.6* 8.6*  PROT 6.6 6.8  BILITOT 1.1 0.7  ALKPHOS 73 70  ALT 19 31  AST 15 22  GLUCOSE 80 97    Assessment & Plan:     ICD-10-CM   1. Nausea/vomiting in pregnancy  O21.9   Concern for cyclical vomiting syndrome, hyperemesis gravidarum,  other etiologies Focused on treatment and maintenance for remainder of pregnancy     2.     Anxiety and stress over diagnosis as well as pregnancy Zoloft and Atarax started to provide relief and assistance, as able to take 3.    GERD Pepcid has been given.  Will add Tums Prn as well 4.    30 weeks pregnancy FHTs daily.  Needs to be active w some ambulation.  Diet as able. Next [facility] is next week.  [place] for growth f/u discussed in 2 weeks (need to order once outpatient) 5.    Plan steroid taper to start po if tolerated today.  Pt desires to stay on IV out of fear of nausea again, but encouraged to give it a try timed after Reglan dosing, to work towards outpatient management.  A total  of 30 minutes were spent face-to-face with the patient as well as preparation, review, communication, and documentation during this encounter.   Annamarie Major, MD, [facility], [facility] 02/18/2020  10:48 AM

## 2020-02-18 NOTE — Progress Notes (Signed)
Steroid taper is as follows:  Prednisone PO 40 mg for 1 day (ordered)  Followed by:  20 mg for 3 days (ordered)  Followed by:  10 mg for 3 days (not yet ordered)  Followed by:   5 mg for 7 days (not yet ordered)  *Taper will need to be ordered on discharge according to day of taper  Tresea Mall, CNM

## 2020-02-18 NOTE — Progress Notes (Signed)
NST reactive. Transferred back to 346

## 2020-02-18 NOTE — Progress Notes (Signed)
Since IV reglan and zofran at 0850 this morning, patient has been able to tolerate all PO medication. Patient has had 1 bout of nausea around 1430 but no vomiting. Patient states "I'm feeling pretty good right now." Patient ate 1 whole bagel and some fruit for breakfast and is currently working on mac and cheese and a sandwich for dinner. Voiding adequately. VSS. PRN imodium ordered for her diarrhea but pharmacy states its on order from [facility]. Patient's prenatal vitamin that was ordered is also on order from [place]. Patient updated and RN to give once meds become available. RN will continue to monitor.

## 2020-02-18 NOTE — Progress Notes (Signed)
   Wyline Mood , MD [address], [place], [place], [address] 3940 [address], [place], [place], [address] Phone: [phone]  Fax: [phone]   Madison Mack is being followed for nausea , vomiting  Day 2 of follow up   Subjective: Feels much better. Tolerated her bagel for breakfast, no other complaints.    Objective: Vital signs in last 24 hours: Vitals:   02/18/20 0337 02/18/20 0545 02/18/20 0628 02/18/20 0835  BP: 117/61  139/66 122/63  Pulse: 74  100 82  Resp: 18  19 18   Temp: 98.2 F (36.8 C)  98 F (36.7 C) 98.3 F (36.8 C)  TempSrc: Oral  Oral Oral  SpO2: 99%  98% 97%  Weight:  112.5 kg    Height:       Weight change: 0.454 kg  Intake/Output Summary (Last 24 hours) at 02/18/2020 1118 Last data filed at 02/18/2020 1047 Gross per 24 hour  Intake 4472.05 ml  Output 2350 ml  Net 2122.05 ml     Exam: Alert and orientated x 3 not in any pain or distress   Lab Results: @LABTEST2 @ Micro Results: Recent Results (from the past 240 hour(s))  C Difficile Quick Screen w PCR reflex     Status: None   Collection Time: 02/15/20  3:30 PM   Specimen: STOOL  Result Value Ref Range Status   C Diff antigen NEGATIVE NEGATIVE Final   C Diff toxin NEGATIVE NEGATIVE Final   C Diff interpretation No C. difficile detected.  Final    Comment: Performed at [facility], [address] [place], [address] [place]   Studies/Results: No results found. Medications: I have reviewed the patient's current medications. Scheduled Meds: . famotidine  20 mg Oral Q12H  . feeding supplement (ENSURE ENLIVE)  237 mL Oral BID BM  . metoCLOPramide  10 mg Oral Q6H   Or  . metoCLOPramide (REGLAN) injection  10 mg Intravenous Q6H  . [START ON 02/19/2020] predniSONE  20 mg Oral Q breakfast  . prenatal vitamin w/FE, FA  1 tablet Oral Q1200  . sertraline  50 mg Oral Daily   Continuous Infusions: . famotidine (PEPCID) IV Stopped (02/17/20 2234)  .  lactated ringers 125 mL/hr at 02/18/20 1047  . ondansetron (ZOFRAN) IV     PRN Meds:.acetaminophen, calcium carbonate, hydrOXYzine, loperamide HCl, ondansetron **OR** ondansetron (ZOFRAN) IV **OR** ondansetron (ZOFRAN) IV, promethazine **OR** promethazine   Assessment: Active Problems:   Hyperemesis affecting pregnancy, antepartum   Hyperemesis gravidarum   Madison Mack 24 y.o. female being followed by Dr Florence Canner for nausea and vomiting , urine drug screen positive for Cannabis. [redacted] weeks pregnant. On Pepcid. On a steroid taper.   Plan: 1. Cannabis use is very commonly associated with nausea and vomiting even from passive exposure and can stay in the body for many days if not longer. Treatment is to avoid the same . There are numerous other causes for nausea and vomiting but would start with avoiding cannabis in the future . Continue Pepcid.  I will sign off.  Please call me if any further GI concerns or questions.  We would like to thank you for the opportunity to participate in the care of Madison Mack.   LOS: 3 days   Norma Fredrickson, MD 02/18/2020, 11:18 AM

## 2020-02-18 NOTE — Progress Notes (Signed)
Pt seen, doing well today Tol po's well  A NST procedure was performed with FHR monitoring and a normal baseline established, appropriate time of 20-40 minutes of evaluation, and accels >2 seen w 15x15 characteristics.  Results show a REACTIVE NST.   Plan: monitor overnight, discharge tomorrow if no new worrisome nausea/vomiting findings  Annamarie Major, MD, [facility], [facility] 02/18/2020  6:09 PM

## 2020-02-18 NOTE — Progress Notes (Signed)
Patient nauseous this morning. IV reglan and Zofran given. Patient refused Ensure and oral prednisone, stating she tried oral phenergan around 6am and vomited 20 minutes later. RN discussed the goal of switching to PO medicine, however, patient refuses at this time due to N/V. Patient asked if prednisone could be switched to IV. Provider, Dr. Tiburcio Pea, updated, during rounds. Patient to attempt oral meds, per Dr. Tiburcio Pea. PO prednisone and pepcid given. Patient to call RN with any vomiting. RN encouraged patient to attempt crackers, etc. Patient states she is going to order some fruit.   Vitals stable and WDL. Voiding adequately. Patient still having diarrhea. IV fluid infusing. FHT 146. RN will continue to monitor.

## 2020-02-18 NOTE — OB Triage Note (Signed)
Transferred from 346 for NST.

## 2020-02-19 MED ORDER — PREDNISONE 10 MG PO TABS
ORAL_TABLET | ORAL | 0 refills | Status: DC
Start: 2020-02-19 — End: 2020-04-14

## 2020-02-19 MED ORDER — METOCLOPRAMIDE HCL 10 MG PO TABS: 10.0000 mg | ORAL_TABLET | Freq: Three times a day (TID) | ORAL | 4 refills | Status: DC

## 2020-02-19 MED ORDER — ONDANSETRON 4 MG PO TBDP: 4.0000 mg | ORAL_TABLET | Freq: Four times a day (QID) | ORAL | 2 refills | Status: DC | PRN

## 2020-02-19 MED ORDER — HYDROXYZINE HCL 25 MG PO TABS: 25.0000 mg | ORAL_TABLET | Freq: Three times a day (TID) | ORAL | 0 refills | Status: DC | PRN

## 2020-02-19 NOTE — Discharge Instructions (Signed)
Prednisone TAPER What is this medicine? PREDNISONE (PRED ni sone) is a corticosteroid. It is commonly used to treat inflammation of the skin, joints, lungs, and other organs. Common conditions treated include asthma, allergies, and arthritis. It is also used for other conditions, such as blood disorders and diseases of the adrenal glands. This medicine may be used for other purposes; ask your health care provider or pharmacist if you have questions. COMMON BRAND NAME(S): Deltasone, Predone, Sterapred, Sterapred DS What should I tell my health care provider before I take this medicine? They need to know if you have any of these conditions:  Cushing's syndrome  diabetes  glaucoma  heart disease  high blood pressure  infection (especially a virus infection such as chickenpox, cold sores, or herpes)  kidney disease  liver disease  mental illness  myasthenia gravis  osteoporosis  seizures  stomach or intestine problems  thyroid disease  an unusual or allergic reaction to lactose, prednisone, other medicines, foods, dyes, or preservatives  pregnant or trying to get pregnant  breast-feeding How should I use this medicine? Take this medicine by mouth with a glass of water. Follow the directions on the prescription label. Take this medicine with food. If you are taking this medicine once a day, take it in the morning. Do not take more medicine than you are told to take. Do not suddenly stop taking your medicine because you may develop a severe reaction. Your doctor will tell you how much medicine to take. If your doctor wants you to stop the medicine, the dose may be slowly lowered over time to avoid any side effects. Talk to your pediatrician regarding the use of this medicine in children. Special care may be needed. Overdosage: If you think you have taken too much of this medicine contact a poison control center or emergency room at once. NOTE: This medicine is only for you. Do  not share this medicine with others. What if I miss a dose? If you miss a dose, take it as soon as you can. If it is almost time for your next dose, talk to your doctor or health care professional. You may need to miss a dose or take an extra dose. Do not take double or extra doses without advice. What may interact with this medicine? Do not take this medicine with any of the following medications:  metyrapone  mifepristone This medicine may also interact with the following medications:  aminoglutethimide  amphotericin B  aspirin and aspirin-like medicines  barbiturates  certain medicines for diabetes, like glipizide or glyburide  cholestyramine  cholinesterase inhibitors  cyclosporine  digoxin  diuretics  ephedrine  female hormones, like estrogens and birth control pills  isoniazid  ketoconazole  NSAIDS, medicines for pain and inflammation, like ibuprofen or naproxen  phenytoin  rifampin  toxoids  vaccines  warfarin This list may not describe all possible interactions. Give your health care provider a list of all the medicines, herbs, non-prescription drugs, or dietary supplements you use. Also tell them if you smoke, drink alcohol, or use illegal drugs. Some items may interact with your medicine. What should I watch for while using this medicine? Visit your doctor or health care professional for regular checks on your progress. If you are taking this medicine over a prolonged period, carry an identification card with your name and address, the type and dose of your medicine, and your doctor's name and address. This medicine may increase your risk of getting an infection. Tell your doctor or  health care professional if you are around anyone with measles or chickenpox, or if you develop sores or blisters that do not heal properly. If you are going to have surgery, tell your doctor or health care professional that you have taken this medicine within the last  twelve months. Ask your doctor or health care professional about your diet. You may need to lower the amount of salt you eat. This medicine may increase blood sugar. Ask your healthcare provider if changes in diet or medicines are needed if you have diabetes. What side effects may I notice from receiving this medicine? Side effects that you should report to your doctor or health care professional as soon as possible:  allergic reactions like skin rash, itching or hives, swelling of the face, lips, or tongue  changes in emotions or moods  changes in vision  depressed mood  eye pain  fever or chills, cough, sore throat, pain or difficulty passing urine  signs and symptoms of high blood sugar such as being more thirsty or hungry or having to urinate more than normal. You may also feel very tired or have blurry vision.  swelling of ankles, feet Side effects that usually do not require medical attention (report to your doctor or health care professional if they continue or are bothersome):  confusion, excitement, restlessness  headache  nausea, vomiting  skin problems, acne, thin and shiny skin  trouble sleeping  weight gain This list may not describe all possible side effects. Call your doctor for medical advice about side effects. You may report side effects to FDA at 1-800-FDA-1088. Where should I keep my medicine? Keep out of the reach of children. Store at room temperature between 15 and 30 degrees C (59 and 86 degrees F). Protect from light. Keep container tightly closed. Throw away any unused medicine after the expiration date. NOTE: This sheet is a summary. It may not cover all possible information. If you have questions about this medicine, talk to your doctor, pharmacist, or health care provider.  2020 Elsevier/Gold Standard (2018-04-27 10:54:22)  

## 2020-02-19 NOTE — Progress Notes (Signed)
Discharge instructions complete and prescriptions sent to the pharmacy by the provider. Patient verbalizes understanding of teaching. Patient discharged home via wheelchair at 1050.

## 2020-02-19 NOTE — Discharge Summary (Signed)
Physician Discharge Summary  Patient ID: Madison Mack MRN: [id number] DOB/AGE: Jul 05, 1996 24 y.o.  Admit date: 02/14/2020 Discharge date: 02/19/2020  Admission Diagnoses:Active Problems:   Hyperemesis affecting pregnancy, antepartum   Hyperemesis gravidarum   30 weeks pregnancy  Discharge Diagnoses:  Active Problems:   Hyperemesis affecting pregnancy, antepartum   Hyperemesis gravidarum   Discharged Condition: good  Hospital Course: Pt seen,  Examined and admitted for refractory nausea and vomiting in pregnancy at 30 weeks.  She had imaging studies, labs, consults, and care management until she was able to tolerate po's well.  Importance of hydration and nourishment counseled to patient.  Current regimen includes a prednisone taper, Reglan, Zofran, and also Atarax for anxiety along with Zoloft she has been on.  Diarrhea has improved.  NST reactive daily.  Discharge instructions and follow up discussed w pt.  Consults: GI  Significant Diagnostic Studies: A NST procedure was performed with FHR monitoring and a normal baseline established, appropriate time of 20-40 minutes of evaluation, and accels >2 seen w 15x15 characteristics.  Results show a REACTIVE NST.   Treatments: IV hydration and steroids: prednisone  Discharge Exam: Blood pressure (!) 116/54, pulse 75, temperature 98.5 F (36.9 C), temperature source Oral, resp. rate 18, height 5\' 8"  (1.727 m), weight 113.4 kg, last menstrual period 07/16/2019, SpO2 98 %. General appearance: alert, cooperative and no distress Resp: clear to auscultation bilaterally Cardio: regular rate and rhythm, S1, S2 normal, no murmur, click, rub or gallop GI: gravid, ND, NT Extremities: extremities normal, atraumatic, no cyanosis or edema Skin: Skin color, texture, turgor normal. No rashes or lesions Neurologic: Grossly normal  Disposition:  There are no questions and answers to display.         Allergies as of 02/19/2020       Reactions   Penicillins Rash   Has patient had a PCN reaction causing immediate rash, facial/tongue/throat swelling, SOB or lightheadedness with hypotension: SWELLING FEET AND HANDS, HIVES Has patient had a PCN reaction causing severe rash involving mucus membranes or skin necrosis: NO Has patient had a PCN reaction that required hospitalization: NO Has patient had a PCN reaction occurring within the last 10 years: NO If all of the above answers are "NO", then may proceed with Cephalosporin use.   Amoxicillin    Doxycycline Hives      Medication List    TAKE these medications   albuterol 108 (90 Base) MCG/ACT inhaler Commonly known as: VENTOLIN HFA Inhale 2 puffs into the lungs every 6 (six) hours as needed for wheezing or shortness of breath.   diphenhydrAMINE 25 MG tablet Commonly known as: BENADRYL Take 25 mg by mouth every 6 (six) hours as needed.   hydrOXYzine 25 MG tablet Commonly known as: ATARAX/VISTARIL Take 1 tablet (25 mg total) by mouth 3 (three) times daily as needed for anxiety.   metoCLOPramide 10 MG tablet Commonly known as: REGLAN Take 1 tablet (10 mg total) by mouth 4 (four) times daily -  before meals and at bedtime.   multivitamin-prenatal 27-0.8 MG Tabs tablet Take 1 tablet by mouth daily at 12 noon.   omeprazole 20 MG capsule Commonly known as: PRILOSEC Take 20 mg by mouth daily.   ondansetron 4 MG disintegrating tablet Commonly known as: ZOFRAN-ODT Take 1 tablet (4 mg total) by mouth every 6 (six) hours as needed for nausea or vomiting.   predniSONE 10 MG tablet Commonly known as: DELTASONE 20 mg (2 pills) po daily for 3 days, then 10  mg (1 pill) po daily for 3 days, then 5 mg (1/2 pill) po daily for 3 days   promethazine 25 MG suppository Commonly known as: Phenergan Place 1 suppository (25 mg total) rectally every 8 (eight) hours as needed for nausea or vomiting.   scopolamine 1 MG/3DAYS Commonly known as: Transderm-Scop (1.5 MG) Place 1  patch (1.5 mg total) onto the skin every 3 (three) days.   sertraline 50 MG tablet Commonly known as: Zoloft Take 1 tablet (50 mg total) by mouth daily.       Follow-up Information    Nadara Mustard, MD. Go in 1 week(s).   Specialty: Obstetrics and Gynecology Why: as scheduled Thurs Contact information: [address] [place] [address] [phone]               Signed: Letitia Libra 02/19/2020, 10:15 AM

## 2020-02-23 ENCOUNTER — Encounter: Payer: Self-pay | Admitting: Obstetrics & Gynecology

## 2020-02-23 ENCOUNTER — Other Ambulatory Visit: Payer: Self-pay

## 2020-02-23 ENCOUNTER — Ambulatory Visit (INDEPENDENT_AMBULATORY_CARE_PROVIDER_SITE_OTHER): Payer: BC Managed Care – PPO | Admitting: Obstetrics & Gynecology

## 2020-02-23 VITALS — BP 120/80 | Wt 250.0 lb

## 2020-02-23 DIAGNOSIS — O99213 Obesity complicating pregnancy, third trimester: Secondary | ICD-10-CM

## 2020-02-23 DIAGNOSIS — O0993 Supervision of high risk pregnancy, unspecified, third trimester: Secondary | ICD-10-CM

## 2020-02-23 DIAGNOSIS — Z3A31 31 weeks gestation of pregnancy: Secondary | ICD-10-CM

## 2020-02-23 DIAGNOSIS — Z6837 Body mass index (BMI) 37.0-37.9, adult: Secondary | ICD-10-CM

## 2020-02-23 LAB — POCT URINALYSIS DIPSTICK OB
Glucose, UA: NEGATIVE
POC,PROTEIN,UA: NEGATIVE

## 2020-02-23 NOTE — Progress Notes (Signed)
°  Subjective  Fetal Movement? yes Contractions? no Leaking Fluid? no Vaginal Bleeding? no Improved nausea since steroid taper, still on at this time.   No new emesis since hosptial d/c Sunday Objective  BP 120/80    Wt 250 lb (113.4 kg)    LMP 07/16/2019 (Exact Date)    BMI 38.01 kg/m  General: NAD Pumonary: no increased work of breathing Abdomen: gravid, non-tender Extremities: no edema Psychiatric: mood appropriate, affect full  Assessment  24 y.o. G2P1001 at [redacted]w[redacted]d by  04/25/2020, by Ultrasound presenting for routine prenatal visit  Plan   Problem List Items Addressed This Visit      Other   Supervision of high risk pregnancy, antepartum   Obesity affecting pregnancy in first trimester   BMI 37.0-37.9, adult    Other Visit Diagnoses    [redacted] weeks gestation of pregnancy    -  Primary      pregnancy Problems (from 08/29/19 to present)    Problem Noted Resolved   Supervision of high risk pregnancy, antepartum 08/29/2019 by Conard Novak, MD No   Overview Addendum 02/09/2020  2:31 PM by [name], CNM    Clinic [facility] Prenatal Labs  Dating 6wk6d ultrasound Blood type: A/Negative/-- (01/18 1107)   Genetic Screen 1 Screen:  Unable to get NT Antibody:Negative (01/18 1107)  Anatomic [place] [facility], 5/26 complete Rubella: 3.06 (01/18 1107) Varicella: Immune  GTT Early:   118  Third trimester:  RPR: Non Reactive (01/18 1107)   Rhogam 02/01/20 HBsAg: Negative (01/18 1107)   TDaP vaccine                       Flu Shot: HIV: Non Reactive (01/18 1107)   Baby Food      Breast                          GBS:   Contraception Mirena Pap:08/2019  CBB  No   CS/VBAC N/A   Support Person Husband           Discussed breast feeding again, plans, has pump ordered  Finish steroid taper  [place] next week grwoth  Plans IUD  PNV, [facility]   Annamarie Major, MD, [facility], [facility] 02/23/2020  8:42 AM

## 2020-02-23 NOTE — Addendum Note (Signed)
Addended by: Cornelius Moras D on: 02/23/2020 08:45 AM   Modules accepted: Orders

## 2020-02-23 NOTE — Patient Instructions (Signed)
Thank you for choosing [facility]. As part of our ongoing efforts to improve patient experience, we would appreciate your feedback. Please fill out the short survey that you will receive by mail or MyChart. Your opinion is important to us! -Dr Brenlyn Beshara  

## 2020-03-01 ENCOUNTER — Ambulatory Visit (INDEPENDENT_AMBULATORY_CARE_PROVIDER_SITE_OTHER): Payer: BC Managed Care – PPO

## 2020-03-01 ENCOUNTER — Ambulatory Visit (INDEPENDENT_AMBULATORY_CARE_PROVIDER_SITE_OTHER): Payer: BC Managed Care – PPO | Admitting: Obstetrics

## 2020-03-01 ENCOUNTER — Other Ambulatory Visit: Payer: Self-pay

## 2020-03-01 VITALS — BP 128/80 | Wt 248.0 lb

## 2020-03-01 DIAGNOSIS — O0993 Supervision of high risk pregnancy, unspecified, third trimester: Secondary | ICD-10-CM

## 2020-03-01 DIAGNOSIS — Z3A32 32 weeks gestation of pregnancy: Secondary | ICD-10-CM

## 2020-03-01 DIAGNOSIS — O99213 Obesity complicating pregnancy, third trimester: Secondary | ICD-10-CM

## 2020-03-01 NOTE — Patient Instructions (Signed)
Third Trimester of Pregnancy The third trimester is from week 28 through week 40 (months 7 through 9). The third trimester is a time when the unborn baby (fetus) is growing rapidly. At the end of the ninth month, the fetus is about 20 inches in length and weighs 6-10 pounds. Body changes during your third trimester Your body will continue to go through many changes during pregnancy. The changes vary from woman to woman. During the third trimester:  Your weight will continue to increase. You can expect to gain 25-35 pounds (11-16 kg) by the end of the pregnancy.  You may begin to get stretch marks on your hips, abdomen, and breasts.  You may urinate more often because the fetus is moving lower into your pelvis and pressing on your bladder.  You may develop or continue to have heartburn. This is caused by increased hormones that slow down muscles in the digestive tract.  You may develop or continue to have constipation because increased hormones slow digestion and cause the muscles that push waste through your intestines to relax.  You may develop hemorrhoids. These are swollen veins (varicose veins) in the rectum that can itch or be painful.  You may develop swollen, bulging veins (varicose veins) in your legs.  You may have increased body aches in the pelvis, back, or thighs. This is due to weight gain and increased hormones that are relaxing your joints.  You may have changes in your hair. These can include thickening of your hair, rapid growth, and changes in texture. Some women also have hair loss during or after pregnancy, or hair that feels dry or thin. Your hair will most likely return to normal after your baby is born.  Your breasts will continue to grow and they will continue to become tender. A yellow fluid (colostrum) may leak from your breasts. This is the first milk you are producing for your baby.  Your belly button may stick out.  You may notice more swelling in your hands,  face, or ankles.  You may have increased tingling or numbness in your hands, arms, and legs. The skin on your belly may also feel numb.  You may feel short of breath because of your expanding uterus.  You may have more problems sleeping. This can be caused by the size of your belly, increased need to urinate, and an increase in your body's metabolism.  You may notice the fetus "dropping," or moving lower in your abdomen (lightening).  You may have increased vaginal discharge.  You may notice your joints feel loose and you may have pain around your pelvic bone. What to expect at prenatal visits You will have prenatal exams every 2 weeks until week 36. Then you will have weekly prenatal exams. During a routine prenatal visit:  You will be weighed to make sure you and the baby are growing normally.  Your blood pressure will be taken.  Your abdomen will be measured to track your baby's growth.  The fetal heartbeat will be listened to.  Any test results from the previous visit will be discussed.  You may have a cervical check near your due date to see if your cervix has softened or thinned (effaced).  You will be tested for Group B streptococcus. This happens between 35 and 37 weeks. Your health care provider may ask you:  What your birth plan is.  How you are feeling.  If you are feeling the baby move.  If you have had any abnormal   symptoms, such as leaking fluid, bleeding, severe headaches, or abdominal cramping.  If you are using any tobacco products, including cigarettes, chewing tobacco, and electronic cigarettes.  If you have any questions. Other tests or screenings that may be performed during your third trimester include:  Blood tests that check for low iron levels (anemia).  Fetal testing to check the health, activity level, and growth of the fetus. Testing is done if you have certain medical conditions or if there are problems during the pregnancy.  Nonstress test  (NST). This test checks the health of your baby to make sure there are no signs of problems, such as the baby not getting enough oxygen. During this test, a belt is placed around your belly. The baby is made to move, and its heart rate is monitored during movement. What is false labor? False labor is a condition in which you feel small, irregular tightenings of the muscles in the womb (contractions) that usually go away with rest, changing position, or drinking water. These are called Braxton Hicks contractions. Contractions may last for hours, days, or even weeks before true labor sets in. If contractions come at regular intervals, become more frequent, increase in intensity, or become painful, you should see your health care provider. What are the signs of labor?  Abdominal cramps.  Regular contractions that start at 10 minutes apart and become stronger and more frequent with time.  Contractions that start on the top of the uterus and spread down to the lower abdomen and back.  Increased pelvic pressure and dull back pain.  A watery or bloody mucus discharge that comes from the vagina.  Leaking of amniotic fluid. This is also known as your "water breaking." It could be a slow trickle or a gush. Let your health care provider know if it has a color or strange odor. If you have any of these signs, call your health care provider right away, even if it is before your due date. Follow these instructions at home: Medicines  Follow your health care provider's instructions regarding medicine use. Specific medicines may be either safe or unsafe to take during pregnancy.  Take a prenatal vitamin that contains at least 600 micrograms (mcg) of folic acid.  If you develop constipation, try taking a stool softener if your health care provider approves. Eating and drinking   Eat a balanced diet that includes fresh fruits and vegetables, whole grains, good sources of protein such as meat, eggs, or tofu,  and low-fat dairy. Your health care provider will help you determine the amount of weight gain that is right for you.  Avoid raw meat and uncooked cheese. These carry germs that can cause birth defects in the baby.  If you have low calcium intake from food, talk to your health care provider about whether you should take a daily calcium supplement.  Eat four or five small meals rather than three large meals a day.  Limit foods that are high in fat and processed sugars, such as fried and sweet foods.  To prevent constipation: ? Drink enough fluid to keep your urine clear or pale yellow. ? Eat foods that are high in fiber, such as fresh fruits and vegetables, whole grains, and beans. Activity  Exercise only as directed by your health care provider. Most women can continue their usual exercise routine during pregnancy. Try to exercise for 30 minutes at least 5 days a week. Stop exercising if you experience uterine contractions.  Avoid heavy lifting.  Do   not exercise in extreme heat or humidity, or at high altitudes.  Wear low-heel, comfortable shoes.  Practice good posture.  You may continue to have sex unless your health care provider tells you otherwise. Relieving pain and discomfort  Take frequent breaks and rest with your legs elevated if you have leg cramps or low back pain.  Take warm sitz baths to soothe any pain or discomfort caused by hemorrhoids. Use hemorrhoid cream if your health care provider approves.  Wear a good support bra to prevent discomfort from breast tenderness.  If you develop varicose veins: ? Wear support pantyhose or compression stockings as told by your healthcare provider. ? Elevate your feet for 15 minutes, 3-4 times a day. Prenatal care  Write down your questions. Take them to your prenatal visits.  Keep all your prenatal visits as told by your health care provider. This is important. Safety  Wear your seat belt at all times when driving.  Make  a list of emergency phone numbers, including numbers for family, friends, the hospital, and police and fire departments. General instructions  Avoid cat litter boxes and soil used by cats. These carry germs that can cause birth defects in the baby. If you have a cat, ask someone to clean the litter box for you.  Do not travel far distances unless it is absolutely necessary and only with the approval of your health care provider.  Do not use hot tubs, steam rooms, or saunas.  Do not drink alcohol.  Do not use any products that contain nicotine or tobacco, such as cigarettes and e-cigarettes. If you need help quitting, ask your health care provider.  Do not use any medicinal herbs or unprescribed drugs. These chemicals affect the formation and growth of the baby.  Do not douche or use tampons or scented sanitary pads.  Do not cross your legs for long periods of time.  To prepare for the arrival of your baby: ? Take prenatal classes to understand, practice, and ask questions about labor and delivery. ? Make a trial run to the hospital. ? Visit the hospital and tour the maternity area. ? Arrange for maternity or paternity leave through employers. ? Arrange for family and friends to take care of pets while you are in the hospital. ? Purchase a rear-facing car seat and make sure you know how to install it in your car. ? Pack your hospital bag. ? Prepare the baby's nursery. Make sure to remove all pillows and stuffed animals from the baby's crib to prevent suffocation.  Visit your dentist if you have not gone during your pregnancy. Use a soft toothbrush to brush your teeth and be gentle when you floss. Contact a health care provider if:  You are unsure if you are in labor or if your water has broken.  You become dizzy.  You have mild pelvic cramps, pelvic pressure, or nagging pain in your abdominal area.  You have lower back pain.  You have persistent nausea, vomiting, or  diarrhea.  You have an unusual or bad smelling vaginal discharge.  You have pain when you urinate. Get help right away if:  Your water breaks before 37 weeks.  You have regular contractions less than 5 minutes apart before 37 weeks.  You have a fever.  You are leaking fluid from your vagina.  You have spotting or bleeding from your vagina.  You have severe abdominal pain or cramping.  You have rapid weight loss or weight gain.  You have   shortness of breath with chest pain.  You notice sudden or extreme swelling of your face, hands, ankles, feet, or legs.  Your baby makes fewer than 10 movements in 2 hours.  You have severe headaches that do not go away when you take medicine.  You have vision changes. Summary  The third trimester is from week 28 through week 40, months 7 through 9. The third trimester is a time when the unborn baby (fetus) is growing rapidly.  During the third trimester, your discomfort may increase as you and your baby continue to gain weight. You may have abdominal, leg, and back pain, sleeping problems, and an increased need to urinate.  During the third trimester your breasts will keep growing and they will continue to become tender. A yellow fluid (colostrum) may leak from your breasts. This is the first milk you are producing for your baby.  False labor is a condition in which you feel small, irregular tightenings of the muscles in the womb (contractions) that eventually go away. These are called Braxton Hicks contractions. Contractions may last for hours, days, or even weeks before true labor sets in.  Signs of labor can include: abdominal cramps; regular contractions that start at 10 minutes apart and become stronger and more frequent with time; watery or bloody mucus discharge that comes from the vagina; increased pelvic pressure and dull back pain; and leaking of amniotic fluid. This information is not intended to replace advice given to you by your  health care provider. Make sure you discuss any questions you have with your health care provider. Document Revised: 11/18/2018 Document Reviewed: 09/02/2016 Elsevier Patient Education  2020 [employer]  

## 2020-03-01 NOTE — Progress Notes (Signed)
Routine Prenatal Care Visit  Subjective  Madison Mack is a 24 y.o. G2P1001 at [redacted]w[redacted]d being seen today for ongoing prenatal care.  She is currently monitored for the following issues for this high-risk pregnancy and has MDD (major depressive disorder), recurrent episode, mild (HCC); Cyclic vomiting syndrome; ADD (attention deficit disorder); Intractable vomiting; Barrett's esophagus without dysplasia; Hyperemesis affecting pregnancy, antepartum; Supervision of high risk pregnancy, antepartum; Obesity affecting pregnancy in first trimester; BMI 37.0-37.9, adult; Rh negative state in antepartum period; Nausea and vomiting during pregnancy; [redacted] weeks gestation of pregnancy; and Hyperemesis gravidarum on their problem list.  ----------------------------------------------------------------------------------- Patient reports no bleeding, no contractions, no cramping and and she appers very please sharing that she has a good appetitie now and is enjoying her pregnancy. since starting back on Zoloft. Shares her impressions that anxiety has been a major contributor to her N/V.Marland Kitchen   Contractions: Not present. Vag. Bleeding: None.  Movement: Present. Leaking Fluid denies.  ----------------------------------------------------------------------------------- The following portions of the patient's history were reviewed and updated as appropriate: allergies, current medications, past family history, past medical history, past social history, past surgical history and problem list. Problem list updated.  Objective  Blood pressure 128/80, weight (!) 248 lb (112.5 kg), last menstrual period 07/16/2019. Pregravid weight 246 lb (111.6 kg) Total Weight Gain 2 lb (0.907 kg) Urinalysis: Urine Protein    Urine Glucose    Fetal Status:     Movement: Present     General:  Alert, oriented and cooperative. Patient is in no acute distress.  Skin: Skin is warm and dry. No rash noted.   Cardiovascular: Normal heart rate  noted  Respiratory: Normal respiratory effort, no problems with respiration noted  Abdomen: Soft, gravid, appropriate for gestational age. Pain/Pressure: Present     Pelvic:  Cervical exam deferred        Extremities: Normal range of motion.     Mental Status: Normal mood and affect. Normal behavior. Normal judgment and thought content.  Growth scan: growth at 50% and AC also 50%. NML AFI. Assessment   24 y.o. G2P1001 at [redacted]w[redacted]d by  04/25/2020, by Ultrasound presenting for routine prenatal visit  Appropriate fetal growth.  Plan   pregnancy Problems (from 08/29/19 to present)    Problem Noted Resolved   Supervision of high risk pregnancy, antepartum 08/29/2019 by Conard Novak, MD No   Overview Addendum 02/09/2020  2:31 PM by [name], CNM    Clinic [facility] Prenatal Labs  Dating 6wk6d ultrasound Blood type: A/Negative/-- (01/18 1107)   Genetic Screen 1 Screen:  Unable to get NT Antibody:Negative (01/18 1107)  Anatomic [place] [facility], 5/26 complete Rubella: 3.06 (01/18 1107) Varicella: Immune  GTT Early:   118  Third trimester:  RPR: Non Reactive (01/18 1107)   Rhogam 02/01/20 HBsAg: Negative (01/18 1107)   TDaP vaccine                       Flu Shot: HIV: Non Reactive (01/18 1107)   Baby Food      Breast                          GBS:   Contraception Mirena Pap:08/2019  CBB  No   CS/VBAC N/A   Support Person Husband           Previous Version   Obesity affecting pregnancy in first trimester 08/29/2019 by Conard Novak, MD No   Overview Signed  08/29/2019  5:10 PM by Conard Novak, MD    [ ]  early 1h gtt      BMI 37.0-37.9, adult 08/29/2019 by 08/31/2019, MD No       Preterm labor symptoms and general obstetric precautions including but not limited to vaginal bleeding, contractions, leaking of fluid and fetal movement were reviewed in detail with the patient. Please refer to After Visit Summary for other counseling recommendations.    Return in about 2  weeks (around 03/15/2020) for return OB.  Continue monthly growth scans.  05/15/2020, CNM  03/01/2020 5:27 PM

## 2020-03-16 ENCOUNTER — Encounter: Payer: Self-pay | Admitting: Advanced Practice Midwife

## 2020-03-16 ENCOUNTER — Other Ambulatory Visit: Payer: Self-pay

## 2020-03-16 ENCOUNTER — Ambulatory Visit (INDEPENDENT_AMBULATORY_CARE_PROVIDER_SITE_OTHER): Payer: BC Managed Care – PPO | Admitting: Advanced Practice Midwife

## 2020-03-16 VITALS — BP 126/84 | Wt 254.0 lb

## 2020-03-16 DIAGNOSIS — F419 Anxiety disorder, unspecified: Secondary | ICD-10-CM

## 2020-03-16 DIAGNOSIS — O0993 Supervision of high risk pregnancy, unspecified, third trimester: Secondary | ICD-10-CM

## 2020-03-16 DIAGNOSIS — Z3A34 34 weeks gestation of pregnancy: Secondary | ICD-10-CM

## 2020-03-16 DIAGNOSIS — O99343 Other mental disorders complicating pregnancy, third trimester: Secondary | ICD-10-CM

## 2020-03-16 DIAGNOSIS — O99213 Obesity complicating pregnancy, third trimester: Secondary | ICD-10-CM

## 2020-03-16 MED ORDER — HYDROXYZINE HCL 25 MG PO TABS: 25.0000 mg | ORAL_TABLET | Freq: Three times a day (TID) | ORAL | 1 refills | Status: DC | PRN

## 2020-03-16 NOTE — Progress Notes (Signed)
No vb., no lof. Pt has had some braxton hicks.

## 2020-03-16 NOTE — Progress Notes (Signed)
Routine Prenatal Care Visit  Subjective  Madison Mack is a 24 y.o. G2P1001 at [redacted]w[redacted]d being seen today for ongoing prenatal care.  She is currently monitored for the following issues for this high-risk pregnancy and has MDD (major depressive disorder), recurrent episode, mild (HCC); Cyclic vomiting syndrome; ADD (attention deficit disorder); Intractable vomiting; Barrett's esophagus without dysplasia; Hyperemesis affecting pregnancy, antepartum; Supervision of high risk pregnancy, antepartum; Obesity affecting pregnancy in first trimester; BMI 37.0-37.9, adult; Rh negative state in antepartum period; Nausea and vomiting during pregnancy; [redacted] weeks gestation of pregnancy; and Hyperemesis gravidarum on their problem list.  ----------------------------------------------------------------------------------- Patient reports only one episode of nausea since being hospitalized. Atarax is helping.   Contractions: Irregular. Vag. Bleeding: None.  Movement: Present. Leaking Fluid denies.  ----------------------------------------------------------------------------------- The following portions of the patient's history were reviewed and updated as appropriate: allergies, current medications, past family history, past medical history, past social history, past surgical history and problem list. Problem list updated.  Objective  Blood pressure 126/84, weight 254 lb (115.2 kg), last menstrual period 07/16/2019. Pregravid weight 246 lb (111.6 kg) Total Weight Gain 8 lb (3.629 kg) Urinalysis: Urine Protein    Urine Glucose    Fetal Status: Fetal Heart Rate (bpm): 146   Movement: Present     General:  Alert, oriented and cooperative. Patient is in no acute distress.  Skin: Skin is warm and dry. No rash noted.   Cardiovascular: Normal heart rate noted  Respiratory: Normal respiratory effort, no problems with respiration noted  Abdomen: Soft, gravid, appropriate for gestational age. Pain/Pressure: Present      Pelvic:  Cervical exam deferred        Extremities: Normal range of motion.  Edema: None  Mental Status: Normal mood and affect. Normal behavior. Normal judgment and thought content.   Assessment   24 y.o. G2P1001 at [redacted]w[redacted]d by  04/25/2020, by Ultrasound presenting for routine prenatal visit  Plan   pregnancy Problems (from 08/29/19 to present)    Problem Noted Resolved   Supervision of high risk pregnancy, antepartum 08/29/2019 by Conard Novak, MD No   Overview Addendum 02/09/2020  2:31 PM by [name], CNM    Clinic [facility] Prenatal Labs  Dating 6wk6d ultrasound Blood type: A/Negative/-- (01/18 1107)   Genetic Screen 1 Screen:  Unable to get NT Antibody:Negative (01/18 1107)  Anatomic [place] [facility], 5/26 complete Rubella: 3.06 (01/18 1107) Varicella: Immune  GTT Early:   118  Third trimester:  RPR: Non Reactive (01/18 1107)   Rhogam 02/01/20 HBsAg: Negative (01/18 1107)   TDaP vaccine                       Flu Shot: HIV: Non Reactive (01/18 1107)   Baby Food      Breast                          GBS:   Contraception Mirena Pap:08/2019  CBB  No   CS/VBAC N/A   Support Person Husband           Previous Version   Obesity affecting pregnancy in first trimester 08/29/2019 by Conard Novak, MD No   Overview Signed 08/29/2019  5:10 PM by Conard Novak, MD    [ ]  early 1h gtt      BMI 37.0-37.9, adult 08/29/2019 by 08/31/2019, MD No    Refill Atarax   Preterm labor symptoms and general obstetric  precautions including but not limited to vaginal bleeding, contractions, leaking of fluid and fetal movement were reviewed in detail with the patient.   Return in about 2 weeks (around 03/30/2020) for afi/nst/rob.  Tresea Mall, CNM 03/16/2020 3:08 PM

## 2020-03-28 ENCOUNTER — Ambulatory Visit (INDEPENDENT_AMBULATORY_CARE_PROVIDER_SITE_OTHER): Payer: BC Managed Care – PPO | Admitting: Obstetrics and Gynecology

## 2020-03-28 ENCOUNTER — Ambulatory Visit (INDEPENDENT_AMBULATORY_CARE_PROVIDER_SITE_OTHER): Payer: BC Managed Care – PPO

## 2020-03-28 ENCOUNTER — Other Ambulatory Visit: Payer: Self-pay

## 2020-03-28 ENCOUNTER — Encounter: Payer: Self-pay | Admitting: Obstetrics and Gynecology

## 2020-03-28 ENCOUNTER — Other Ambulatory Visit (HOSPITAL_COMMUNITY)
Admission: RE | Admit: 2020-03-28 | Discharge: 2020-03-28 | Disposition: A | Discharge status: Home / Self Care | Payer: BC Managed Care – PPO | Source: Ambulatory Visit | Attending: Obstetrics and Gynecology | Admitting: Obstetrics and Gynecology

## 2020-03-28 VITALS — BP 116/72 | Ht 68.0 in | Wt 258.8 lb

## 2020-03-28 DIAGNOSIS — Z3A36 36 weeks gestation of pregnancy: Secondary | ICD-10-CM | POA: Diagnosis not present

## 2020-03-28 DIAGNOSIS — O0990 Supervision of high risk pregnancy, unspecified, unspecified trimester: Secondary | ICD-10-CM

## 2020-03-28 DIAGNOSIS — O0993 Supervision of high risk pregnancy, unspecified, third trimester: Secondary | ICD-10-CM | POA: Insufficient documentation

## 2020-03-28 DIAGNOSIS — O99213 Obesity complicating pregnancy, third trimester: Secondary | ICD-10-CM

## 2020-03-28 DIAGNOSIS — O99211 Obesity complicating pregnancy, first trimester: Secondary | ICD-10-CM

## 2020-03-28 LAB — POCT URINALYSIS DIPSTICK OB
Glucose, UA: NEGATIVE
POC,PROTEIN,UA: NEGATIVE

## 2020-03-28 NOTE — Patient Instructions (Signed)
Third Trimester of Pregnancy The third trimester is from week 28 through week 40 (months 7 through 9). The third trimester is a time when the unborn baby (fetus) is growing rapidly. At the end of the ninth month, the fetus is about 20 inches in length and weighs 6-10 pounds. Body changes during your third trimester Your body will continue to go through many changes during pregnancy. The changes vary from woman to woman. During the third trimester:  Your weight will continue to increase. You can expect to gain 25-35 pounds (11-16 kg) by the end of the pregnancy.  You may begin to get stretch marks on your hips, abdomen, and breasts.  You may urinate more often because the fetus is moving lower into your pelvis and pressing on your bladder.  You may develop or continue to have heartburn. This is caused by increased hormones that slow down muscles in the digestive tract.  You may develop or continue to have constipation because increased hormones slow digestion and cause the muscles that push waste through your intestines to relax.  You may develop hemorrhoids. These are swollen veins (varicose veins) in the rectum that can itch or be painful.  You may develop swollen, bulging veins (varicose veins) in your legs.  You may have increased body aches in the pelvis, back, or thighs. This is due to weight gain and increased hormones that are relaxing your joints.  You may have changes in your hair. These can include thickening of your hair, rapid growth, and changes in texture. Some women also have hair loss during or after pregnancy, or hair that feels dry or thin. Your hair will most likely return to normal after your baby is born.  Your breasts will continue to grow and they will continue to become tender. A yellow fluid (colostrum) may leak from your breasts. This is the first milk you are producing for your baby.  Your belly button may stick out.  You may notice more swelling in your hands,  face, or ankles.  You may have increased tingling or numbness in your hands, arms, and legs. The skin on your belly may also feel numb.  You may feel short of breath because of your expanding uterus.  You may have more problems sleeping. This can be caused by the size of your belly, increased need to urinate, and an increase in your body's metabolism.  You may notice the fetus "dropping," or moving lower in your abdomen (lightening).  You may have increased vaginal discharge.  You may notice your joints feel loose and you may have pain around your pelvic bone. What to expect at prenatal visits You will have prenatal exams every 2 weeks until week 36. Then you will have weekly prenatal exams. During a routine prenatal visit:  You will be weighed to make sure you and the baby are growing normally.  Your blood pressure will be taken.  Your abdomen will be measured to track your baby's growth.  The fetal heartbeat will be listened to.  Any test results from the previous visit will be discussed.  You may have a cervical check near your due date to see if your cervix has softened or thinned (effaced).  You will be tested for Group B streptococcus. This happens between 35 and 37 weeks. Your health care provider may ask you:  What your birth plan is.  How you are feeling.  If you are feeling the baby move.  If you have had any abnormal   symptoms, such as leaking fluid, bleeding, severe headaches, or abdominal cramping.  If you are using any tobacco products, including cigarettes, chewing tobacco, and electronic cigarettes.  If you have any questions. Other tests or screenings that may be performed during your third trimester include:  Blood tests that check for low iron levels (anemia).  Fetal testing to check the health, activity level, and growth of the fetus. Testing is done if you have certain medical conditions or if there are problems during the pregnancy.  Nonstress test  (NST). This test checks the health of your baby to make sure there are no signs of problems, such as the baby not getting enough oxygen. During this test, a belt is placed around your belly. The baby is made to move, and its heart rate is monitored during movement. What is false labor? False labor is a condition in which you feel small, irregular tightenings of the muscles in the womb (contractions) that usually go away with rest, changing position, or drinking water. These are called Braxton Hicks contractions. Contractions may last for hours, days, or even weeks before true labor sets in. If contractions come at regular intervals, become more frequent, increase in intensity, or become painful, you should see your health care provider. What are the signs of labor?  Abdominal cramps.  Regular contractions that start at 10 minutes apart and become stronger and more frequent with time.  Contractions that start on the top of the uterus and spread down to the lower abdomen and back.  Increased pelvic pressure and dull back pain.  A watery or bloody mucus discharge that comes from the vagina.  Leaking of amniotic fluid. This is also known as your "water breaking." It could be a slow trickle or a gush. Let your health care provider know if it has a color or strange odor. If you have any of these signs, call your health care provider right away, even if it is before your due date. Follow these instructions at home: Medicines  Follow your health care provider's instructions regarding medicine use. Specific medicines may be either safe or unsafe to take during pregnancy.  Take a prenatal vitamin that contains at least 600 micrograms (mcg) of folic acid.  If you develop constipation, try taking a stool softener if your health care provider approves. Eating and drinking   Eat a balanced diet that includes fresh fruits and vegetables, whole grains, good sources of protein such as meat, eggs, or tofu,  and low-fat dairy. Your health care provider will help you determine the amount of weight gain that is right for you.  Avoid raw meat and uncooked cheese. These carry germs that can cause birth defects in the baby.  If you have low calcium intake from food, talk to your health care provider about whether you should take a daily calcium supplement.  Eat four or five small meals rather than three large meals a day.  Limit foods that are high in fat and processed sugars, such as fried and sweet foods.  To prevent constipation: ? Drink enough fluid to keep your urine clear or pale yellow. ? Eat foods that are high in fiber, such as fresh fruits and vegetables, whole grains, and beans. Activity  Exercise only as directed by your health care provider. Most women can continue their usual exercise routine during pregnancy. Try to exercise for 30 minutes at least 5 days a week. Stop exercising if you experience uterine contractions.  Avoid heavy lifting.  Do   not exercise in extreme heat or humidity, or at high altitudes.  Wear low-heel, comfortable shoes.  Practice good posture.  You may continue to have sex unless your health care provider tells you otherwise. Relieving pain and discomfort  Take frequent breaks and rest with your legs elevated if you have leg cramps or low back pain.  Take warm sitz baths to soothe any pain or discomfort caused by hemorrhoids. Use hemorrhoid cream if your health care provider approves.  Wear a good support bra to prevent discomfort from breast tenderness.  If you develop varicose veins: ? Wear support pantyhose or compression stockings as told by your healthcare provider. ? Elevate your feet for 15 minutes, 3-4 times a day. Prenatal care  Write down your questions. Take them to your prenatal visits.  Keep all your prenatal visits as told by your health care provider. This is important. Safety  Wear your seat belt at all times when driving.  Make  a list of emergency phone numbers, including numbers for family, friends, the hospital, and police and fire departments. General instructions  Avoid cat litter boxes and soil used by cats. These carry germs that can cause birth defects in the baby. If you have a cat, ask someone to clean the litter box for you.  Do not travel far distances unless it is absolutely necessary and only with the approval of your health care provider.  Do not use hot tubs, steam rooms, or saunas.  Do not drink alcohol.  Do not use any products that contain nicotine or tobacco, such as cigarettes and e-cigarettes. If you need help quitting, ask your health care provider.  Do not use any medicinal herbs or unprescribed drugs. These chemicals affect the formation and growth of the baby.  Do not douche or use tampons or scented sanitary pads.  Do not cross your legs for long periods of time.  To prepare for the arrival of your baby: ? Take prenatal classes to understand, practice, and ask questions about labor and delivery. ? Make a trial run to the hospital. ? Visit the hospital and tour the maternity area. ? Arrange for maternity or paternity leave through employers. ? Arrange for family and friends to take care of pets while you are in the hospital. ? Purchase a rear-facing car seat and make sure you know how to install it in your car. ? Pack your hospital bag. ? Prepare the baby's nursery. Make sure to remove all pillows and stuffed animals from the baby's crib to prevent suffocation.  Visit your dentist if you have not gone during your pregnancy. Use a soft toothbrush to brush your teeth and be gentle when you floss. Contact a health care provider if:  You are unsure if you are in labor or if your water has broken.  You become dizzy.  You have mild pelvic cramps, pelvic pressure, or nagging pain in your abdominal area.  You have lower back pain.  You have persistent nausea, vomiting, or  diarrhea.  You have an unusual or bad smelling vaginal discharge.  You have pain when you urinate. Get help right away if:  Your water breaks before 37 weeks.  You have regular contractions less than 5 minutes apart before 37 weeks.  You have a fever.  You are leaking fluid from your vagina.  You have spotting or bleeding from your vagina.  You have severe abdominal pain or cramping.  You have rapid weight loss or weight gain.  You have   shortness of breath with chest pain.  You notice sudden or extreme swelling of your face, hands, ankles, feet, or legs.  Your baby makes fewer than 10 movements in 2 hours.  You have severe headaches that do not go away when you take medicine.  You have vision changes. Summary  The third trimester is from week 28 through week 40, months 7 through 9. The third trimester is a time when the unborn baby (fetus) is growing rapidly.  During the third trimester, your discomfort may increase as you and your baby continue to gain weight. You may have abdominal, leg, and back pain, sleeping problems, and an increased need to urinate.  During the third trimester your breasts will keep growing and they will continue to become tender. A yellow fluid (colostrum) may leak from your breasts. This is the first milk you are producing for your baby.  False labor is a condition in which you feel small, irregular tightenings of the muscles in the womb (contractions) that eventually go away. These are called Braxton Hicks contractions. Contractions may last for hours, days, or even weeks before true labor sets in.  Signs of labor can include: abdominal cramps; regular contractions that start at 10 minutes apart and become stronger and more frequent with time; watery or bloody mucus discharge that comes from the vagina; increased pelvic pressure and dull back pain; and leaking of amniotic fluid. This information is not intended to replace advice given to you by your  health care provider. Make sure you discuss any questions you have with your health care provider. Document Revised: 11/18/2018 Document Reviewed: 09/02/2016 Elsevier Patient Education  2020 [employer]   Pain Relief During Labor and Delivery Many things can cause pain during labor and delivery, including:  Pressure on bones and ligaments due to the baby moving through the pelvis.  Stretching of tissues due to the baby moving through the birth canal.  Muscle tension due to anxiety or nervousness.  The uterus tightening (contracting) and relaxing to help move the baby. There are many ways to deal with the pain of labor and delivery. They include:  Taking prenatal classes. Taking these classes helps you know what to expect during your baby's birth. What you learn will increase your confidence and decrease your anxiety.  Practicing relaxation techniques or doing relaxing activities, such as: ? Focused breathing. ? Meditation. ? Visualization. ? Aroma therapy. ? Listening to your favorite music. ? Hypnosis.  Taking a warm shower or bath (hydrotherapy). This may: ? Provide comfort and relaxation. ? Lessen your perception of pain. ? Decrease the amount of pain medicine needed. ? Decrease the length of labor.  Getting a massage or counterpressure on your back.  Applying warm packs or ice packs.  Changing positions often, moving around, or using a birthing ball.  Getting: ? Pain medicine through an IV or injection into a muscle. ? Pain medicine inserted into your spinal column. ? Injections of sterile water just under the skin on your lower back (intradermal injections). ? Laughing gas (nitrous oxide). Discuss your pain control options with your health care provider during your prenatal visits. Explore the options offered by your hospital or birth center. What kinds of medicine are available? There are two kinds of medicines that can be used to relieve pain during labor and  delivery:  Analgesics. These medicines decrease pain without causing you to lose feeling or the ability to move your muscles.  Anesthetics. These medicines block feeling in the body   and can decrease your ability to move freely. Both of these kinds of medicine can cause minor side effects, such as nausea, trouble concentrating, and sleepiness. They can also decrease the baby's heart rate before birth and affect the baby's breathing rate after birth. For this reason, health care providers are careful about when and how much medicine is given. What are specific medicines and procedures that provide pain relief? Local Anesthetics Local anesthetics are used to numb a small area of the body. They may be used along with another kind of anesthetic or used to numb the nerves of the vagina, cervix, and perineum during the second stage of labor. General Anesthetics General anesthetics cause you to lose consciousness so you do not feel pain. They are usually only used for an emergency cesarean delivery. General anesthetics are given through an IV tube and a mask. Pudendal Block A pudendal block is a form of local anesthetic. It may be used to relieve the pain associated with pushing or stretching of the perineum at the time of delivery or to further numb the perineum. A pudendal block is done by injecting numbing medicine through the vaginal wall into a nerve in the pelvis. Epidural Analgesia Epidural analgesia is given through a flexible IV catheter that is inserted into the lower back. Numbing medicine is delivered continuously to the area near your spinal column nerves (epidural space). After having this type of analgesia, you may be able to move your legs but you most likely will not be able to walk. Depending on the amount of medicine given, you may lose all feeling in the lower half of your body, or you may retain some level of sensation, including the urge to push. Epidural analgesia can be used to provide  pain relief for a vaginal birth. Spinal Block A spinal block is similar to epidural analgesia, but the medicine is injected into the spinal fluid instead of the epidural space. A spinal block is only given once. It starts to relieve pain quickly, but the pain relief lasts only 1-6 hours. Spinal blocks can be used for cesarean deliveries. Combined Spinal-Epidural (CSE) Block A CSE block combines the effects of a spinal block and epidural analgesia. The spinal block works quickly to block all pain. The epidural analgesia provides continuous pain relief, even after the effects of the spinal block have worn off. This information is not intended to replace advice given to you by your health care provider. Make sure you discuss any questions you have with your health care provider. Document Revised: 07/10/2017 Document Reviewed: 12/19/2015 Elsevier Patient Education  2020 [employer].   Vaginal Delivery  Vaginal delivery means that you give birth by pushing your baby out of your birth canal (vagina). A team of health care providers will help you before, during, and after vaginal delivery. Birth experiences are unique for every woman and every pregnancy, and birth experiences vary depending on where you choose to give birth. What happens when I arrive at the birth center or hospital? Once you are in labor and have been admitted into the hospital or birth center, your health care provider may:  Review your pregnancy history and any concerns that you have.  Insert an IV into one of your veins. This may be used to give you fluids and medicines.  Check your blood pressure, pulse, temperature, and heart rate (vital signs).  Check whether your bag of water (amniotic sac) has broken (ruptured).  Talk with you about your birth plan and  discuss pain control options. Monitoring Your health care provider may monitor your contractions (uterine monitoring) and your baby's heart rate (fetal monitoring). You  may need to be monitored:  Often, but not continuously (intermittently).  All the time or for long periods at a time (continuously). Continuous monitoring may be needed if: ? You are taking certain medicines, such as medicine to relieve pain or make your contractions stronger. ? You have pregnancy or labor complications. Monitoring may be done by:  Placing a special stethoscope or a handheld monitoring device on your abdomen to check your baby's heartbeat and to check for contractions.  Placing monitors on your abdomen (external monitors) to record your baby's heartbeat and the frequency and length of contractions.  Placing monitors inside your uterus through your vagina (internal monitors) to record your baby's heartbeat and the frequency, length, and strength of your contractions. Depending on the type of monitor, it may remain in your uterus or on your baby's head until birth.  Telemetry. This is a type of continuous monitoring that can be done with external or internal monitors. Instead of having to stay in bed, you are able to move around during telemetry. Physical exam Your health care provider may perform frequent physical exams. This may include:  Checking how and where your baby is positioned in your uterus.  Checking your cervix to determine: ? Whether it is thinning out (effacing). ? Whether it is opening up (dilating). What happens during labor and delivery?  Normal labor and delivery is divided into the following three stages: Stage 1  This is the longest stage of labor.  This stage can last for hours or days.  Throughout this stage, you will feel contractions. Contractions generally feel mild, infrequent, and irregular at first. They get stronger, more frequent (about every 2-3 minutes), and more regular as you move through this stage.  This stage ends when your cervix is completely dilated to 4 inches (10 cm) and completely effaced. Stage 2  This stage starts once  your cervix is completely effaced and dilated and lasts until the delivery of your baby.  This stage may last from 20 minutes to 2 hours.  This is the stage where you will feel an urge to push your baby out of your vagina.  You may feel stretching and burning pain, especially when the widest part of your baby's head passes through the vaginal opening (crowning).  Once your baby is delivered, the umbilical cord will be clamped and cut. This usually occurs after waiting a period of 1-2 minutes after delivery.  Your baby will be placed on your bare chest (skin-to-skin contact) in an upright position and covered with a warm blanket. Watch your baby for feeding cues, like rooting or sucking, and help the baby to your breast for his or her first feeding. Stage 3  This stage starts immediately after the birth of your baby and ends after you deliver the placenta.  This stage may take anywhere from 5 to 30 minutes.  After your baby has been delivered, you will feel contractions as your body expels the placenta and your uterus contracts to control bleeding. What can I expect after labor and delivery?  After labor is over, you and your baby will be monitored closely until you are ready to go home to ensure that you are both healthy. Your health care team will teach you how to care for yourself and your baby.  You and your baby will stay in the same  room (rooming in) during your hospital stay. This will encourage early bonding and successful breastfeeding.  You may continue to receive fluids and medicines through an IV.  Your uterus will be checked and massaged regularly (fundal massage).  You will have some soreness and pain in your abdomen, vagina, and the area of skin between your vaginal opening and your anus (perineum).  If an incision was made near your vagina (episiotomy) or if you had some vaginal tearing during delivery, cold compresses may be placed on your episiotomy or your tear. This  helps to reduce pain and swelling.  You may be given a squirt bottle to use instead of wiping when you go to the bathroom. To use the squirt bottle, follow these steps: ? Before you urinate, fill the squirt bottle with warm water. Do not use hot water. ? After you urinate, while you are sitting on the toilet, use the squirt bottle to rinse the area around your urethra and vaginal opening. This rinses away any urine and blood. ? Fill the squirt bottle with clean water every time you use the bathroom.  It is normal to have vaginal bleeding after delivery. Wear a sanitary pad for vaginal bleeding and discharge. Summary  Vaginal delivery means that you will give birth by pushing your baby out of your birth canal (vagina).  Your health care provider may monitor your contractions (uterine monitoring) and your baby's heart rate (fetal monitoring).  Your health care provider may perform a physical exam.  Normal labor and delivery is divided into three stages.  After labor is over, you and your baby will be monitored closely until you are ready to go home. This information is not intended to replace advice given to you by your health care provider. Make sure you discuss any questions you have with your health care provider. Document Revised: 09/01/2017 Document Reviewed: 09/01/2017 Elsevier Patient Education  2020 [employer].

## 2020-03-28 NOTE — Progress Notes (Signed)
Routine Prenatal Care Visit  Subjective  Madison Mack is a 24 y.o. G2P1001 at [redacted]w[redacted]d being seen today for ongoing prenatal care.  She is currently monitored for the following issues for this high-risk pregnancy and has MDD (major depressive disorder), recurrent episode, mild (HCC); Cyclic vomiting syndrome; ADD (attention deficit disorder); Intractable vomiting; Barrett's esophagus without dysplasia; Hyperemesis affecting pregnancy, antepartum; Supervision of high risk pregnancy, antepartum; Obesity affecting pregnancy in first trimester; BMI 37.0-37.9, adult; Rh negative state in antepartum period; Nausea and vomiting during pregnancy; [redacted] weeks gestation of pregnancy; and Hyperemesis gravidarum on their problem list.  ----------------------------------------------------------------------------------- Patient reports no complaints.   Contractions: Irregular. Vag. Bleeding: None.  Movement: Present. Denies leaking of fluid.  ----------------------------------------------------------------------------------- The following portions of the patient's history were reviewed and updated as appropriate: allergies, current medications, past family history, past medical history, past social history, past surgical history and problem list. Problem list updated.   Objective  Blood pressure 116/72, height 5\' 8"  (1.727 m), weight 258 lb 12.8 oz (117.4 kg), last menstrual period 07/16/2019. Pregravid weight 246 lb (111.6 kg) Total Weight Gain 12 lb 12.8 oz (5.806 kg) Urinalysis:      Fetal Status: Fetal Heart Rate (bpm): 130   Movement: Present  Presentation: Vertex  General:  Alert, oriented and cooperative. Patient is in no acute distress.  Skin: Skin is warm and dry. No rash noted.   Cardiovascular: Normal heart rate noted  Respiratory: Normal respiratory effort, no problems with respiration noted  Abdomen: Soft, gravid, appropriate for gestational age. Pain/Pressure: Absent     Pelvic:   Cervical exam deferred Dilation: 3 Effacement (%): 50 Station: -3  Extremities: Normal range of motion.     Mental Status: Normal mood and affect. Normal behavior. Normal judgment and thought content.     Assessment   24 y.o. G2P1001 at [redacted]w[redacted]d by  04/25/2020, by Ultrasound presenting for routine prenatal visit  Plan   pregnancy Problems (from 08/29/19 to present)    Problem Noted Resolved   Supervision of high risk pregnancy, antepartum 08/29/2019 by 08/31/2019, MD No   Overview Addendum 03/28/2020  2:59 PM by 03/30/2020, MD    [facility]inic [facility] Prenatal Labs  Dating 6wk6d ultrasound Blood type: A/Negative/-- (01/18 1107)   Genetic Screen 1 Screen:  Unable to get NT Antibody:Negative (01/18 1107)  Anatomic 11-28-1993 [facility], 5/26 complete Rubella: 3.06 (01/18 1107) Varicella: Immune  GTT Early:   118  Third trimester: 124 RPR: Non Reactive (01/18 1107)   Rhogam 02/01/20 HBsAg: Negative (01/18 1107)   TDaP vaccine    02/09/2020     Flu Shot: HIV: Non Reactive (01/18 1107)   Baby Food      Breast                          GBS:   Contraception Mirena Pap:08/2019  CBB  No   CS/VBAC N/A   Support Person Husband           Previous Version   Obesity affecting pregnancy in first trimester 08/29/2019 by 08/31/2019, MD No   Overview Signed 08/29/2019  5:10 PM by 08/31/2019, MD    [ ]  early 1h gtt      BMI 37.0-37.9, adult 08/29/2019 by 05-04-1972, MD No       NST: 130 bpm baseline, MODERATE variability, 15x15 accelerations, no decelerations.  GBS GC/CT today  Gestational age appropriate obstetric  precautions including but not limited to vaginal bleeding, contractions, leaking of fluid and fetal movement were reviewed in detail with the patient.    Return in about 1 week (around 04/04/2020) for ROB in person/ afi us/ nst.  Natale Milch MD [facility], [facility] 03/28/2020, 3:37 PM

## 2020-03-29 ENCOUNTER — Other Ambulatory Visit: Payer: Self-pay | Admitting: Obstetrics and Gynecology

## 2020-03-29 ENCOUNTER — Observation Stay
Admission: EM | Admit: 2020-03-29 | Discharge: 2020-03-29 | Disposition: A | Discharge status: Home / Self Care | Payer: BC Managed Care – PPO | Attending: Obstetrics and Gynecology | Admitting: Obstetrics and Gynecology

## 2020-03-29 DIAGNOSIS — Z3A36 36 weeks gestation of pregnancy: Secondary | ICD-10-CM | POA: Insufficient documentation

## 2020-03-29 DIAGNOSIS — Z79899 Other long term (current) drug therapy: Secondary | ICD-10-CM | POA: Diagnosis not present

## 2020-03-29 MED ORDER — BETAMETHASONE SOD PHOS & ACET 6 (3-3) MG/ML IJ SUSP
12.0000 mg | INTRAMUSCULAR | Status: DC
  Administered 2020-03-29: 12 mg via INTRAMUSCULAR
  Filled 2020-03-29: qty 5

## 2020-03-29 NOTE — Discharge Summary (Signed)
Physician Discharge Summary  Patient ID: Madison Mack MRN: [id number] DOB/AGE: 25/02/1996 25 y.o.  Admit date: 03/29/2020 Discharge date: 03/29/2020  Admission Diagnoses:  Discharge Diagnoses:  Active Problems:   Preterm labor   Discharged Condition: good  Hospital Course: 24 yo G2P1001 [redacted]w[redacted]d here with contractions. No cervical change in 2 hours. Betamethasone given. Patient agreeable for discharge. Will return tomorrow for second dose.   Consults: None  Significant Diagnostic Studies: labs: none  Treatments: oral hydration encouraged, betamethasone  Discharge Exam: Temperature 98.4 F (36.9 C), temperature source Oral, resp. rate 20, height 5\' 9"  (1.753 m), weight 113 kg, last menstrual period 07/16/2019. General appearance: alert and cooperative Resp: clear to auscultation bilaterally Cardio: regular rate and rhythm, S1, S2 normal, no murmur, click, rub or gallop GI: soft, non-tender; bowel sounds normal; no masses,  no organomegaly Extremities: extremities normal, atraumatic, no cyanosis or edema Skin: Skin color, texture, turgor normal. No rashes or lesions  Disposition:  Discharge disposition: 01-Home or Self Care        Allergies as of 03/29/2020      Reactions   Penicillins Rash   Has patient had a PCN reaction causing immediate rash, facial/tongue/throat swelling, SOB or lightheadedness with hypotension: SWELLING FEET AND HANDS, HIVES Has patient had a PCN reaction causing severe rash involving mucus membranes or skin necrosis: NO Has patient had a PCN reaction that required hospitalization: NO Has patient had a PCN reaction occurring within the last 10 years: NO If all of the above answers are "NO", then may proceed with Cephalosporin use.   Amoxicillin    Doxycycline Hives      Medication List    TAKE these medications   hydrOXYzine 25 MG tablet Commonly known as: ATARAX/VISTARIL Take 1 tablet (25 mg total) by mouth 3 (three) times daily as  needed for anxiety.   metoCLOPramide 10 MG tablet Commonly known as: REGLAN Take 1 tablet (10 mg total) by mouth 4 (four) times daily -  before meals and at bedtime.   multivitamin-prenatal 27-0.8 MG Tabs tablet Take 1 tablet by mouth daily at 12 noon.   omeprazole 20 MG capsule Commonly known as: PRILOSEC Take 20 mg by mouth daily.   ondansetron 4 MG disintegrating tablet Commonly known as: ZOFRAN-ODT Take 1 tablet (4 mg total) by mouth every 6 (six) hours as needed for nausea or vomiting.   predniSONE 10 MG tablet Commonly known as: DELTASONE 20 mg (2 pills) po daily for 3 days, then 10 mg (1 pill) po daily for 3 days, then 5 mg (1/2 pill) po daily for 3 days   promethazine 25 MG suppository Commonly known as: Phenergan Place 1 suppository (25 mg total) rectally every 8 (eight) hours as needed for nausea or vomiting.   scopolamine 1 MG/3DAYS Commonly known as: Transderm-Scop (1.5 MG) Place 1 patch (1.5 mg total) onto the skin every 3 (three) days.   sertraline 50 MG tablet Commonly known as: Zoloft Take 1 tablet (50 mg total) by mouth daily.        Signed: 03/31/2020 03/29/2020, 6:39 PM

## 2020-03-29 NOTE — H&P (Signed)
Madison Mack is an 24 y.o. female.   Chief Complaint: Contractions HPI: 24 yo G2P1001 [redacted]w[redacted]d she presents today to labor and delivery with complaints of contractions.  She reports that the contractions started about 2 hours ago.  The contractions are moderate in strength and occurring every 3 to 5 minutes.  She reports that she has been well hydrated throughout today.  This event was similar to the last time she went into labor.  She lost her mucous plug earlier this morning.  Exam by nursing is unchanged from office exam yesterday.  pregnancy Problems (from 08/29/19 to present)    Problem Noted Resolved   Supervision of high risk pregnancy, antepartum 08/29/2019 by Conard Novak, MD No   Overview Addendum 03/28/2020  2:59 PM by Natale Milch, MD    Clinic [facility] Prenatal Labs  Dating 6wk6d ultrasound Blood type: A/Negative/-- (01/18 1107)   Genetic Screen 1 Screen:  Unable to get NT Antibody:Negative (01/18 1107)  Anatomic [place] [facility], 5/26 complete Rubella: 3.06 (01/18 1107) Varicella: Immune  GTT Early:   118   Third trimester: 124 RPR: Non Reactive (01/18 1107)   Rhogam 02/01/20 HBsAg: Negative (01/18 1107)   TDaP vaccine    02/09/2020     Flu Shot: HIV: Non Reactive (01/18 1107)   Baby Food      Breast                          GBS: pending  Contraception Mirena Pap:08/2019  CBB  No   CS/VBAC N/A   Support Person Husband           Previous Version   Obesity affecting pregnancy in first trimester 08/29/2019 by Conard Novak, MD No   Overview Signed 08/29/2019  5:10 PM by Conard Novak, MD    [ ]  early 1h gtt      BMI 37.0-37.9, adult 08/29/2019 by 08/31/2019, MD No       Past Medical History:  Diagnosis Date  . Allergy   . Anxiety   . HA (headache)   . Nausea & vomiting     Past Surgical History:  Procedure Laterality Date  . ANKLE ARTHODESIS W/ ARTHROSCOPY Right   . ANKLE FRACTURE SURGERY Right 04/08  . ESOPHAGOGASTRODUODENOSCOPY (EGD)  WITH PROPOFOL N/A 08/10/2019   Procedure: ESOPHAGOGASTRODUODENOSCOPY (EGD) WITH PROPOFOL;  Surgeon: 08/12/2019, MD;  Location: [facility] ENDOSCOPY;  Service: Endoscopy;  Laterality: N/A;    Family History  Problem Relation Age of Onset  . Hyperlipidemia Father   . Hypertension Mother   . ADD / ADHD Maternal Aunt   . Alcohol abuse Maternal Aunt   . Drug abuse Maternal Aunt   . Anxiety disorder Maternal Aunt   . Alcohol abuse Maternal Uncle   . Drug abuse Maternal Uncle   . Anxiety disorder Paternal Grandfather   . Depression Paternal Grandfather   . Dementia Paternal Grandmother    Social History:  reports that she has never smoked. She has never used smokeless tobacco. She reports that she does not drink alcohol and does not use drugs.  Allergies:  Allergies  Allergen Reactions  . Penicillins Rash    Has patient had a PCN reaction causing immediate rash, facial/tongue/throat swelling, SOB or lightheadedness with hypotension: SWELLING FEET AND HANDS, HIVES Has patient had a PCN reaction causing severe rash involving mucus membranes or skin necrosis: NO Has patient had a PCN reaction that required  hospitalization: NO Has patient had a PCN reaction occurring within the last 10 years: NO If all of the above answers are "NO", then may proceed with Cephalosporin use.   Marland Kitchen Amoxicillin   . Doxycycline Hives    Medications Prior to Admission  Medication Sig Dispense Refill  . hydrOXYzine (ATARAX/VISTARIL) 25 MG tablet Take 1 tablet (25 mg total) by mouth 3 (three) times daily as needed for anxiety. 30 tablet 1  . metoCLOPramide (REGLAN) 10 MG tablet Take 1 tablet (10 mg total) by mouth 4 (four) times daily -  before meals and at bedtime. 180 tablet 4  . omeprazole (PRILOSEC) 20 MG capsule Take 20 mg by mouth daily.    . ondansetron (ZOFRAN-ODT) 4 MG disintegrating tablet Take 1 tablet (4 mg total) by mouth every 6 (six) hours as needed for nausea or vomiting. 30 tablet 2  .  Prenatal Vit-Fe Fumarate-FA (MULTIVITAMIN-PRENATAL) 27-0.8 MG TABS tablet Take 1 tablet by mouth daily at 12 noon.    . sertraline (ZOLOFT) 50 MG tablet Take 1 tablet (50 mg total) by mouth daily. 30 tablet 2  . predniSONE (DELTASONE) 10 MG tablet 20 mg (2 pills) po daily for 3 days, then 10 mg (1 pill) po daily for 3 days, then 5 mg (1/2 pill) po daily for 3 days (Patient not taking: Reported on 03/29/2020) 12 tablet 0  . promethazine (PHENERGAN) 25 MG suppository Place 1 suppository (25 mg total) rectally every 8 (eight) hours as needed for nausea or vomiting. (Patient not taking: Reported on 03/29/2020) 12 each 0  . scopolamine (TRANSDERM-SCOP, 1.5 MG,) 1 MG/3DAYS Place 1 patch (1.5 mg total) onto the skin every 3 (three) days. (Patient not taking: Reported on 03/29/2020) 10 patch 3    Results for orders placed or performed in visit on 03/28/20 (from the past 48 hour(s))  POC Urinalysis Dipstick OB     Status: None   Collection Time: 03/28/20  3:27 PM  Result Value Ref Range   Color, UA     Clarity, UA     Glucose, UA Negative Negative   Bilirubin, UA     Ketones, UA     Spec Grav, UA     Blood, UA     pH, UA     POC,PROTEIN,UA Negative Negative, Trace, Small (1+), Moderate (2+), Large (3+), 4+   Urobilinogen, UA     Nitrite, UA     Leukocytes, UA     Appearance     Odor     No results found.  Review of Systems  Constitutional: Negative for chills and fever.  HENT: Negative for congestion, hearing loss and sinus pain.   Respiratory: Negative for cough, shortness of breath and wheezing.   Cardiovascular: Negative for chest pain, palpitations and leg swelling.  Gastrointestinal: Negative for abdominal pain, constipation, diarrhea, nausea and vomiting.  Genitourinary: Negative for dysuria, flank pain, frequency, hematuria and urgency.  Musculoskeletal: Negative for back pain.  Skin: Negative for rash.  Neurological: Negative for dizziness and headaches.  Psychiatric/Behavioral:  Negative for suicidal ideas. The patient is not nervous/anxious.     Temperature 98.4 F (36.9 C), temperature source Oral, resp. rate 20, height 5\' 9"  (1.753 m), weight 113 kg, last menstrual period 07/16/2019. Physical Exam Vitals and nursing note reviewed.  Constitutional:      Appearance: She is well-developed.  HENT:     Head: Normocephalic and atraumatic.  Cardiovascular:     Rate and Rhythm: Normal rate and regular rhythm.  Pulmonary:     Effort: Pulmonary effort is normal.     Breath sounds: Normal breath sounds.  Abdominal:     General: Bowel sounds are normal.     Palpations: Abdomen is soft.  Musculoskeletal:        General: Normal range of motion.  Skin:    General: Skin is warm and dry.  Neurological:     Mental Status: She is alert and oriented to person, place, and time.  Psychiatric:        Behavior: Behavior normal.        Thought Content: Thought content normal.        Judgment: Judgment normal.     NST: 120 bpm baseline, moderate variability, 15x15 accelerations, no decelerations. Tocometer : every 3-5 minutes  Assessment/Plan 24 year old with preterm contractions. G2P1001 [redacted]w[redacted]d Will admit patient for observation.  We'll recheck cervix in 2 to 3 hours.  If continued dilation will admit for labor.  Patient declines IV fluid hydration but will continue oral hydration at this time.  Given history of previous 37 week delivery, advanced dilation and regular contractions will give patient betamethasone. If patient is making cervical change will need GBS PCR swab   Natale Milch, MD 03/29/2020, 5:27 PM

## 2020-03-29 NOTE — Progress Notes (Signed)
Patient rechecked, unchanged from yesterday. Patient comfortable wth discharge home. Will return for betamethasone shot tomorrow.  Adelene Idler MD, [facility], [facility] 03/29/2020 6:34 PM

## 2020-03-29 NOTE — OB Triage Note (Signed)
Here for evaluation of contractions that started about an hour ago. States she lost her mucous plug earlier today.

## 2020-03-30 ENCOUNTER — Inpatient Hospital Stay
Admission: RE | Admit: 2020-03-30 | Discharge: 2020-03-30 | Disposition: A | Discharge status: Home / Self Care | Payer: BC Managed Care – PPO | Attending: Obstetrics and Gynecology | Admitting: Obstetrics and Gynecology

## 2020-03-30 DIAGNOSIS — O4703 False labor before 37 completed weeks of gestation, third trimester: Secondary | ICD-10-CM | POA: Diagnosis present

## 2020-03-30 DIAGNOSIS — Z3A36 36 weeks gestation of pregnancy: Secondary | ICD-10-CM | POA: Insufficient documentation

## 2020-03-30 LAB — CERVICOVAGINAL ANCILLARY ONLY
Chlamydia: NEGATIVE
Comment: NEGATIVE
Comment: NORMAL
Neisseria Gonorrhea: NEGATIVE

## 2020-03-30 MED ORDER — BETAMETHASONE SOD PHOS & ACET 6 (3-3) MG/ML IJ SUSP
12.0000 mg | Freq: Once | INTRAMUSCULAR | Status: AC
  Administered 2020-03-30: 12 mg via INTRAMUSCULAR

## 2020-04-02 LAB — CULTURE, BETA STREP (GROUP B ONLY): Strep Gp B Culture: NEGATIVE

## 2020-04-04 ENCOUNTER — Other Ambulatory Visit: Payer: Self-pay

## 2020-04-04 ENCOUNTER — Ambulatory Visit (INDEPENDENT_AMBULATORY_CARE_PROVIDER_SITE_OTHER): Payer: BC Managed Care – PPO

## 2020-04-04 ENCOUNTER — Ambulatory Visit (INDEPENDENT_AMBULATORY_CARE_PROVIDER_SITE_OTHER): Payer: BC Managed Care – PPO | Admitting: Obstetrics

## 2020-04-04 ENCOUNTER — Other Ambulatory Visit: Payer: Self-pay | Admitting: Obstetrics and Gynecology

## 2020-04-04 VITALS — BP 128/70 | Wt 256.6 lb

## 2020-04-04 DIAGNOSIS — Z3689 Encounter for other specified antenatal screening: Secondary | ICD-10-CM

## 2020-04-04 DIAGNOSIS — Z3A37 37 weeks gestation of pregnancy: Secondary | ICD-10-CM | POA: Diagnosis not present

## 2020-04-04 DIAGNOSIS — O0990 Supervision of high risk pregnancy, unspecified, unspecified trimester: Secondary | ICD-10-CM

## 2020-04-04 DIAGNOSIS — O0993 Supervision of high risk pregnancy, unspecified, third trimester: Secondary | ICD-10-CM

## 2020-04-04 LAB — POCT URINALYSIS DIPSTICK OB: Glucose, UA: NEGATIVE

## 2020-04-04 NOTE — Progress Notes (Signed)
Routine Prenatal Care Visit  Subjective  Madison Mack is a 24 y.o. G2P1001 at [redacted]w[redacted]d being seen today for ongoing prenatal care.  She is currently monitored for the following issues for this high-risk pregnancy and has MDD (major depressive disorder), recurrent episode, mild (HCC); Cyclic vomiting syndrome; ADD (attention deficit disorder); Intractable vomiting; Barrett's esophagus without dysplasia; Hyperemesis affecting pregnancy, antepartum; Supervision of high risk pregnancy, antepartum; Obesity affecting pregnancy in first trimester; BMI 37.0-37.9, adult; Rh negative state in antepartum period; Nausea and vomiting during pregnancy; [redacted] weeks gestation of pregnancy; Hyperemesis gravidarum; and Preterm labor on their problem list.  ----------------------------------------------------------------------------------- Patient reports no complaints.   Contractions: Irregular.  .  Movement: Present. Leaking Fluid denies.  ----------------------------------------------------------------------------------- The following portions of the patient's history were reviewed and updated as appropriate: allergies, current medications, past family history, past medical history, past social history, past surgical history and problem list. Problem list updated.  Objective  Blood pressure 128/70, weight 256 lb 9.6 oz (116.4 kg), last menstrual period 07/16/2019. Pregravid weight 246 lb (111.6 kg) Total Weight Gain 10 lb 9.6 oz (4.808 kg) Urinalysis: Urine Protein    Urine Glucose    Fetal Status:     Movement: Present     General:  Alert, oriented and cooperative. Patient is in no acute distress.  Skin: Skin is warm and dry. No rash noted.   Cardiovascular: Normal heart rate noted  Respiratory: Normal respiratory effort, no problems with respiration noted  Abdomen: Soft, gravid, appropriate for gestational age. Pain/Pressure: Absent     Pelvic:  Cervical exam performed        Extremities: Normal range  of motion.     Mental Status: Normal mood and affect. Normal behavior. Normal judgment and thought content.   Assessment   24 y.o. G2P1001 at [redacted]w[redacted]d by  04/25/2020, by Ultrasound presenting for routine prenatal visit  Plan   pregnancy Problems (from 08/29/19 to present)    Problem Noted Resolved   Supervision of high risk pregnancy, antepartum 08/29/2019 by Conard Novak, MD No   Overview Addendum 04/04/2020  4:31 PM by [name], CNM    [facility]inic [facility] Prenatal Labs  Dating 6wk6d ultrasound Blood type: A/Negative/-- (01/18 1107)   Genetic Screen 1 Screen:  Unable to get NT Antibody:Negative (01/18 1107)  Anatomic [place] [facility], 5/26 complete Rubella: 3.06 (01/18 1107) Varicella: Immune  GTT Early:   118  Third trimester: 124 RPR: Non Reactive (01/18 1107)   Rhogam 02/01/20 HBsAg: Negative (01/18 1107)   TDaP vaccine    02/09/2020     Flu Shot: HIV: Non Reactive (01/18 1107)   Baby Food      Breast                          GBS:  negative  Contraception Mirena Pap:08/2019  CBB  No   CS/VBAC N/A   Support Person Husband           Previous Version   Obesity affecting pregnancy in first trimester 08/29/2019 by Conard Novak, MD No   Overview Signed 08/29/2019  5:10 PM by Conard Novak, MD    [ ]  early 1h gtt      BMI 37.0-37.9, adult 08/29/2019 by 08/31/2019, MD No     Reactive NSt today with baseline of 125, moderate variability, and accels. No decels.  AFI is 17 today- WNL.  Term labor symptoms and general obstetric precautions including but  not limited to vaginal bleeding, contractions, leaking of fluid and fetal movement were reviewed in detail with the patient. Please refer to After Visit Summary for other counseling recommendations.  Normal AFI discussed today. NST reactive. She is interested in IOL at 39 weeks. Will discuss next visit. Return in about 1 week (around 04/11/2020) for return OB.  [name], CNM  04/04/2020 4:31 PM

## 2020-04-12 ENCOUNTER — Encounter: Payer: Self-pay | Admitting: Obstetrics & Gynecology

## 2020-04-12 ENCOUNTER — Inpatient Hospital Stay: Payer: BC Managed Care – PPO | Admitting: Anesthesiology

## 2020-04-12 ENCOUNTER — Encounter: Payer: BC Managed Care – PPO | Admitting: Advanced Practice Midwife

## 2020-04-12 ENCOUNTER — Inpatient Hospital Stay
Admission: EM | Admit: 2020-04-12 | Discharge: 2020-04-14 | DRG: 806 | Disposition: A | Discharge status: Home / Self Care | Payer: BC Managed Care – PPO | Attending: Obstetrics & Gynecology | Admitting: Obstetrics & Gynecology

## 2020-04-12 ENCOUNTER — Telehealth: Payer: Self-pay | Admitting: Obstetrics & Gynecology

## 2020-04-12 ENCOUNTER — Other Ambulatory Visit: Payer: Self-pay

## 2020-04-12 DIAGNOSIS — Z3A38 38 weeks gestation of pregnancy: Secondary | ICD-10-CM

## 2020-04-12 DIAGNOSIS — Z6791 Unspecified blood type, Rh negative: Secondary | ICD-10-CM

## 2020-04-12 DIAGNOSIS — K2270 Barrett's esophagus without dysplasia: Secondary | ICD-10-CM | POA: Diagnosis present

## 2020-04-12 DIAGNOSIS — O26899 Other specified pregnancy related conditions, unspecified trimester: Secondary | ICD-10-CM

## 2020-04-12 DIAGNOSIS — O99344 Other mental disorders complicating childbirth: Secondary | ICD-10-CM | POA: Diagnosis present

## 2020-04-12 DIAGNOSIS — Z6837 Body mass index (BMI) 37.0-37.9, adult: Secondary | ICD-10-CM

## 2020-04-12 DIAGNOSIS — O9962 Diseases of the digestive system complicating childbirth: Secondary | ICD-10-CM | POA: Diagnosis present

## 2020-04-12 DIAGNOSIS — O9081 Anemia of the puerperium: Secondary | ICD-10-CM | POA: Diagnosis not present

## 2020-04-12 DIAGNOSIS — Z880 Allergy status to penicillin: Secondary | ICD-10-CM

## 2020-04-12 DIAGNOSIS — D62 Acute posthemorrhagic anemia: Secondary | ICD-10-CM | POA: Diagnosis not present

## 2020-04-12 DIAGNOSIS — O26893 Other specified pregnancy related conditions, third trimester: Secondary | ICD-10-CM | POA: Diagnosis present

## 2020-04-12 DIAGNOSIS — O99211 Obesity complicating pregnancy, first trimester: Secondary | ICD-10-CM

## 2020-04-12 DIAGNOSIS — F329 Major depressive disorder, single episode, unspecified: Secondary | ICD-10-CM | POA: Diagnosis present

## 2020-04-12 DIAGNOSIS — F419 Anxiety disorder, unspecified: Secondary | ICD-10-CM | POA: Diagnosis present

## 2020-04-12 DIAGNOSIS — O99214 Obesity complicating childbirth: Secondary | ICD-10-CM | POA: Diagnosis present

## 2020-04-12 DIAGNOSIS — E669 Obesity, unspecified: Secondary | ICD-10-CM | POA: Diagnosis present

## 2020-04-12 DIAGNOSIS — Z20822 Contact with and (suspected) exposure to covid-19: Secondary | ICD-10-CM | POA: Diagnosis present

## 2020-04-12 DIAGNOSIS — O0990 Supervision of high risk pregnancy, unspecified, unspecified trimester: Secondary | ICD-10-CM

## 2020-04-12 LAB — CBC WITH DIFFERENTIAL/PLATELET
Abs Immature Granulocytes: 0.06 10*3/uL (ref 0.00–0.07)
Basophils Absolute: 0 10*3/uL (ref 0.0–0.1)
Basophils Relative: 0 %
Eosinophils Absolute: 0.1 10*3/uL (ref 0.0–0.5)
Eosinophils Relative: 1 %
HCT: 36.5 % (ref 36.0–46.0)
Hemoglobin: 12.1 g/dL (ref 12.0–15.0)
Immature Granulocytes: 1 %
Lymphocytes Relative: 16 %
Lymphs Abs: 1.8 10*3/uL (ref 0.7–4.0)
MCH: 28.8 pg (ref 26.0–34.0)
MCHC: 33.2 g/dL (ref 30.0–36.0)
MCV: 86.9 fL (ref 80.0–100.0)
Monocytes Absolute: 0.6 10*3/uL (ref 0.1–1.0)
Monocytes Relative: 6 %
Neutro Abs: 8.5 10*3/uL — ABNORMAL HIGH (ref 1.7–7.7)
Neutrophils Relative %: 76 %
Platelets: 215 10*3/uL (ref 150–400)
RBC: 4.2 MIL/uL (ref 3.87–5.11)
RDW: 14.6 % (ref 11.5–15.5)
WBC: 11 10*3/uL — ABNORMAL HIGH (ref 4.0–10.5)
nRBC: 0 % (ref 0.0–0.2)

## 2020-04-12 LAB — ABO/RH: ABO/RH(D): A NEG

## 2020-04-12 LAB — URINALYSIS, COMPLETE (UACMP) WITH MICROSCOPIC
Bilirubin Urine: NEGATIVE
Glucose, UA: 50 mg/dL — AB
Ketones, ur: 5 mg/dL — AB
Nitrite: NEGATIVE
Protein, ur: 30 mg/dL — AB
RBC / HPF: >50 RBC/hpf — ABNORMAL HIGH (ref 0–5)
Specific Gravity, Urine: 1.02 (ref 1.005–1.030)
pH: 6 (ref 5.0–8.0)

## 2020-04-12 LAB — SARS CORONAVIRUS 2 BY RT PCR (HOSPITAL ORDER, PERFORMED IN ~~LOC~~ HOSPITAL LAB): SARS Coronavirus 2: NEGATIVE

## 2020-04-12 LAB — TYPE AND SCREEN
ABO/RH(D): A NEG
Antibody Screen: NEGATIVE

## 2020-04-12 MED ORDER — OXYTOCIN-SODIUM CHLORIDE 30-0.9 UT/500ML-% IV SOLN
1.0000 m[IU]/min | INTRAVENOUS | Status: DC
  Administered 2020-04-12: 2 m[IU]/min via INTRAVENOUS
  Filled 2020-04-12: qty 500

## 2020-04-12 MED ORDER — LACTATED RINGERS IV SOLN: 500.0000 mL | Freq: Once | INTRAVENOUS | Status: DC

## 2020-04-12 MED ORDER — LIDOCAINE HCL (PF) 1 % IJ SOLN
INTRAMUSCULAR | Status: DC | PRN
  Administered 2020-04-12: 3 mL via SUBCUTANEOUS

## 2020-04-12 MED ORDER — LIDOCAINE-EPINEPHRINE (PF) 1.5 %-1:200000 IJ SOLN
INTRAMUSCULAR | Status: DC | PRN
  Administered 2020-04-12: 3 mL via EPIDURAL

## 2020-04-12 MED ORDER — TERBUTALINE SULFATE 1 MG/ML IJ SOLN: 0.2500 mg | Freq: Once | INTRAMUSCULAR | Status: DC | PRN

## 2020-04-12 MED ORDER — ACETAMINOPHEN 325 MG PO TABS: 650.0000 mg | ORAL_TABLET | ORAL | Status: DC | PRN

## 2020-04-12 MED ORDER — OXYTOCIN BOLUS FROM INFUSION
333.0000 mL | Freq: Once | INTRAVENOUS | Status: AC
  Administered 2020-04-12: 333 mL via INTRAVENOUS

## 2020-04-12 MED ORDER — FENTANYL 2.5 MCG/ML W/ROPIVACAINE 0.15% IN NS 100 ML EPIDURAL (ARMC)
EPIDURAL | Status: DC | PRN
Start: 2020-04-12 — End: 2020-04-12
  Administered 2020-04-12: 12 mL/h via EPIDURAL
  Administered 2020-04-12: 250 ug via EPIDURAL

## 2020-04-12 MED ORDER — PHENYLEPHRINE 40 MCG/ML (10ML) SYRINGE FOR IV PUSH (FOR BLOOD PRESSURE SUPPORT)
80.0000 ug | PREFILLED_SYRINGE | INTRAVENOUS | Status: DC | PRN
  Filled 2020-04-12: qty 10

## 2020-04-12 MED ORDER — FENTANYL 2.5 MCG/ML W/ROPIVACAINE 0.15% IN NS 100 ML EPIDURAL (ARMC)
EPIDURAL | Status: AC
  Filled 2020-04-12: qty 100

## 2020-04-12 MED ORDER — OXYTOCIN-SODIUM CHLORIDE 30-0.9 UT/500ML-% IV SOLN
2.5000 [IU]/h | INTRAVENOUS | Status: DC
  Filled 2020-04-12: qty 500

## 2020-04-12 MED ORDER — MISOPROSTOL 200 MCG PO TABS
ORAL_TABLET | ORAL | Status: AC
  Filled 2020-04-12: qty 4

## 2020-04-12 MED ORDER — EPHEDRINE 5 MG/ML INJ
10.0000 mg | INTRAVENOUS | Status: DC | PRN
  Filled 2020-04-12: qty 2

## 2020-04-12 MED ORDER — ONDANSETRON HCL 4 MG/2ML IJ SOLN: 4.0000 mg | Freq: Four times a day (QID) | INTRAMUSCULAR | Status: DC | PRN

## 2020-04-12 MED ORDER — SODIUM CHLORIDE 0.9 % IV SOLN
INTRAVENOUS | Status: DC | PRN
  Administered 2020-04-12 (×2): 5 mL via EPIDURAL

## 2020-04-12 MED ORDER — OXYTOCIN 10 UNIT/ML IJ SOLN
INTRAMUSCULAR | Status: AC
  Filled 2020-04-12: qty 2

## 2020-04-12 MED ORDER — LIDOCAINE HCL (PF) 1 % IJ SOLN
INTRAMUSCULAR | Status: AC
  Filled 2020-04-12: qty 30

## 2020-04-12 MED ORDER — BUTORPHANOL TARTRATE 1 MG/ML IJ SOLN: 1.0000 mg | INTRAMUSCULAR | Status: DC | PRN

## 2020-04-12 MED ORDER — FENTANYL 2.5 MCG/ML W/ROPIVACAINE 0.15% IN NS 100 ML EPIDURAL (ARMC): 12.0000 mL/h | EPIDURAL | Status: DC

## 2020-04-12 MED ORDER — DIPHENHYDRAMINE HCL 50 MG/ML IJ SOLN: 12.5000 mg | INTRAMUSCULAR | Status: DC | PRN

## 2020-04-12 MED ORDER — LACTATED RINGERS IV SOLN: 500.0000 mL | INTRAVENOUS | Status: DC | PRN

## 2020-04-12 MED ORDER — LACTATED RINGERS IV SOLN: INTRAVENOUS | Status: DC

## 2020-04-12 MED ORDER — AMMONIA AROMATIC IN INHA
RESPIRATORY_TRACT | Status: AC
  Filled 2020-04-12: qty 10

## 2020-04-12 MED ORDER — LIDOCAINE HCL (PF) 1 % IJ SOLN: 30.0000 mL | INTRAMUSCULAR | Status: DC | PRN

## 2020-04-12 NOTE — Discharge Summary (Signed)
Postpartum Discharge Summary    Patient Name: Madison Mack DOB: 08-19-1995 MRN: [id number]  Date of admission: 04/12/2020 Delivery date:04/12/2020  Delivering provider: Gae Dry  Date of discharge: 04/14/2020  Admitting diagnosis: Labor and delivery, indication for care [O75.9] Normal labor and delivery [O80] Intrauterine pregnancy: [redacted]w[redacted]d    Secondary diagnosis:  Principal Problem:   Labor and delivery, indication for care Active Problems:   Rh negative state in antepartum period   Normal labor and delivery  Additional problems: Obesity, Rh Neg, Depression, Hyperemesis    Discharge diagnosis: Term Pregnancy Delivered                                              Post partum procedures:none Augmentation: AROM and Pitocin Complications: None  Hospital course: Onset of Labor With Vaginal Delivery      24y.o. yo G2P1001 at 381w1das admitted in Latent Labor on 04/12/2020. Patient had an uncomplicated labor course as follows:  Membrane Rupture Time/Date: 7:42 PM ,04/12/2020   Delivery Method:Vaginal, Spontaneous  Episiotomy: None  Lacerations:  Vaginal;1st degree  Patient had an uncomplicated postpartum course.  She is ambulating, tolerating a regular diet, passing flatus, and urinating well. Patient is discharged home in stable condition on 04/14/20.  Newborn Data: Birth date:04/12/2020  Birth time:9:57 PM  Gender:Female  Living status:Living  Apgars:8 ,9  Weight:3220 g   Magnesium Sulfate received: No BMZ received: No Rhophylac:N/A MMR:No T-DaP:Given prenatally Flu: N/A Transfusion:No  Physical exam  Vitals:   04/13/20 1131 04/13/20 1945 04/13/20 2352 04/14/20 0749  BP: 120/74 122/82 (!) 130/97 117/74  Pulse: 81 85 90 66  Resp: _0 Temp: 98.6 F (37 C) 98.3 F (36.8 C) 98.2 F (36.8 C) 97.9 F (36.6 C)  TempSrc: Oral Oral Oral Oral  SpO2:  95% 96% 97%   General: alert, cooperative and no distress Lochia: appropriate Uterine Fundus:  firm Incision: N/A DVT Evaluation: No evidence of DVT seen on physical exam. Labs: Lab Results  Component Value Date   WBC 12.8 (H) 04/13/2020   HGB 11.8 (L) 04/13/2020   HCT 34.5 (L) 04/13/2020   MCV 85.0 04/13/2020   PLT 197 04/13/2020   CMP Latest Ref Rng & Units 02/17/2020  Glucose 70 - 99 mg/dL 97  BUN 6 - 20 mg/dL <5(L)  Creatinine 0.44 - 1.00 mg/dL 0.51  Sodium 135 - 145 mmol/L 138  Potassium 3.5 - 5.1 mmol/L 3.5  Chloride 98 - 111 mmol/L 109  CO2 22 - 32 mmol/L 22  Calcium 8.9 - 10.3 mg/dL 8.6(L)  Total Protein 6.5 - 8.1 g/dL 6.8  Total Bilirubin 0.3 - 1.2 mg/dL 0.7  Alkaline Phos 38 - 126 U/L 70  AST 15 - 41 U/L 22  ALT 0 - 44 U/L 31   Edinburgh Score: Edinburgh Postnatal Depression Scale Screening Tool 04/13/2020  I have been able to laugh and see the funny side of things. 0  I have looked forward with enjoyment to things. 0  I have blamed myself unnecessarily when things went wrong. 1  I have been anxious or worried for no good reason. 0  I have felt scared or panicky for no good reason. 0  Things have been getting on top of me. 0  I have been so unhappy that I have had difficulty sleeping. 0  I have felt sad or miserable. 0  I have been so unhappy that I have been crying. 0  The thought of harming myself has occurred to me. 0  Edinburgh Postnatal Depression Scale Total 1      After visit meds:  Allergies as of 04/14/2020      Reactions   Penicillins Rash   Has patient had a PCN reaction causing immediate rash, facial/tongue/throat swelling, SOB or lightheadedness with hypotension: SWELLING FEET AND HANDS, HIVES Has patient had a PCN reaction causing severe rash involving mucus membranes or skin necrosis: NO Has patient had a PCN reaction that required hospitalization: NO Has patient had a PCN reaction occurring within the last 10 years: NO If all of the above answers are "NO", then may proceed with Cephalosporin use.   Amoxicillin    Doxycycline Hives       Medication List    STOP taking these medications   predniSONE 10 MG tablet Commonly known as: DELTASONE     TAKE these medications   hydrOXYzine 25 MG tablet Commonly known as: ATARAX/VISTARIL Take 1 tablet (25 mg total) by mouth 3 (three) times daily as needed for anxiety.   ibuprofen 600 MG tablet Commonly known as: ADVIL Take 1 tablet (600 mg total) by mouth every 6 (six) hours.   multivitamin-prenatal 27-0.8 MG Tabs tablet Take 1 tablet by mouth daily at 12 noon.   omeprazole 20 MG capsule Commonly known as: PRILOSEC Take 20 mg by mouth daily.   sertraline 50 MG tablet Commonly known as: Zoloft Take 1 tablet (50 mg total) by mouth daily.        Discharge home in stable condition Infant Feeding: Breast Infant Disposition:home with mother Discharge instruction: per After Visit Summary and Postpartum booklet. Activity: Advance as tolerated. Pelvic rest for 6 weeks.  Diet: routine diet Anticipated Birth Control: IUD Postpartum Appointment:6 weeks Additional Postpartum F/U: Postpartum Depression checkup Future Appointments:No future appointments. Follow up Visit:  Follow-up Information    Gae Dry, MD. Schedule an appointment as soon as possible for a visit in 1 week(s).   Specialty: Obstetrics and Gynecology Why: 1 week postpartum depression check, 6 week postpartum visit Contact information: [address] [place] [address] [phone]                   04/14/2020 Malachy Mood, MD

## 2020-04-12 NOTE — OB Triage Note (Signed)
Pt reports loosing her mucus plug yesterday and throughout the night she has had some ctx but mainly a lot of pressure.

## 2020-04-12 NOTE — Anesthesia Procedure Notes (Signed)
Epidural Patient location during procedure: OB Start time: 04/12/2020 4:26 PM End time: 04/12/2020 4:30 PM  Staffing Anesthesiologist: Lenard Simmer, MD Performed: anesthesiologist   Preanesthetic Checklist Completed: patient identified, IV checked, site marked, risks and benefits discussed, surgical consent, monitors and equipment checked, pre-op evaluation and timeout performed  Epidural Patient position: sitting Prep: ChloraPrep Patient monitoring: heart rate, continuous pulse ox and blood pressure Approach: midline Location: L3-L4 Injection technique: LOR saline  Needle:  Needle type: Tuohy  Needle gauge: 17 G Needle length: 9 cm and 9 Needle insertion depth: 6 cm Catheter type: closed end flexible Catheter size: 19 Gauge Catheter at skin depth: 11 cm Test dose: negative and 1.5% lidocaine with Epi 1:200 K  Assessment Events: blood not aspirated, injection not painful, no injection resistance, no paresthesia and negative IV test  Additional Notes   Patient tolerated the insertion well without complications.Reason for block:procedure for pain

## 2020-04-12 NOTE — H&P (Signed)
Obstetrics Admission History & Physical   CC: Labor  HPI:  24 y.o. G2P1001 @ [redacted]w[redacted]d (04/25/2020, by Ultrasound). Admitted on 04/12/2020:   Patient Active Problem List   Diagnosis Date Noted  . Labor and delivery, indication for care 04/12/2020  . Normal labor and delivery 04/12/2020  . Preterm labor 03/29/2020  . Hyperemesis gravidarum 02/14/2020  . [redacted] weeks gestation of pregnancy 02/13/2020  . Nausea and vomiting during pregnancy 01/25/2020  . Rh negative state in antepartum period 09/06/2019  . Supervision of high risk pregnancy, antepartum 08/29/2019  . Obesity affecting pregnancy in first trimester 08/29/2019  . BMI 37.0-37.9, adult 08/29/2019  . Hyperemesis affecting pregnancy, antepartum 08/22/2019  . Intractable vomiting   . Barrett's esophagus without dysplasia   . MDD (major depressive disorder), recurrent episode, mild (HCC) 09/08/2018  . Cyclic vomiting syndrome 09/08/2018  . ADD (attention deficit disorder) 09/08/2018     Presents for painful ctxs today, gradually worsening.  No SROM or VB.   Prenatal care at: at [facility]. Pregnancy complicated by none. Prior NSVD at term 2018.  ROS: A review of systems was performed and negative, except as stated in the above HPI. PMHx:  Past Medical History:  Diagnosis Date  . Allergy   . Anxiety   . HA (headache)   . Nausea & vomiting    PSHx:  Past Surgical History:  Procedure Laterality Date  . ANKLE ARTHODESIS W/ ARTHROSCOPY Right   . ANKLE FRACTURE SURGERY Right 04/08  . ESOPHAGOGASTRODUODENOSCOPY (EGD) WITH PROPOFOL N/A 08/10/2019   Procedure: ESOPHAGOGASTRODUODENOSCOPY (EGD) WITH PROPOFOL;  Surgeon: Pasty Spillers, MD;  Location: [facility] ENDOSCOPY;  Service: Endoscopy;  Laterality: N/A;   Medications:  Medications Prior to Admission  Medication Sig Dispense Refill Last Dose  . hydrOXYzine (ATARAX/VISTARIL) 25 MG tablet Take 1 tablet (25 mg total) by mouth 3 (three) times daily as needed for anxiety. 30 tablet 1  Unknown at Unknown time  . metoCLOPramide (REGLAN) 10 MG tablet Take 1 tablet (10 mg total) by mouth 4 (four) times daily -  before meals and at bedtime. 180 tablet 4 Unknown at Unknown time  . omeprazole (PRILOSEC) 20 MG capsule Take 20 mg by mouth daily.   Unknown at Unknown time  . ondansetron (ZOFRAN-ODT) 4 MG disintegrating tablet Take 1 tablet (4 mg total) by mouth every 6 (six) hours as needed for nausea or vomiting. 30 tablet 2 Unknown at Unknown time  . predniSONE (DELTASONE) 10 MG tablet 20 mg (2 pills) po daily for 3 days, then 10 mg (1 pill) po daily for 3 days, then 5 mg (1/2 pill) po daily for 3 days (Patient not taking: Reported on 03/29/2020) 12 tablet 0 Unknown at Unknown time  . Prenatal Vit-Fe Fumarate-FA (MULTIVITAMIN-PRENATAL) 27-0.8 MG TABS tablet Take 1 tablet by mouth daily at 12 noon.   Unknown at Unknown time  . promethazine (PHENERGAN) 25 MG suppository Place 1 suppository (25 mg total) rectally every 8 (eight) hours as needed for nausea or vomiting. (Patient not taking: Reported on 03/29/2020) 12 each 0 Unknown at Unknown time  . scopolamine (TRANSDERM-SCOP, 1.5 MG,) 1 MG/3DAYS Place 1 patch (1.5 mg total) onto the skin every 3 (three) days. (Patient not taking: Reported on 03/29/2020) 10 patch 3 Unknown at Unknown time  . sertraline (ZOLOFT) 50 MG tablet Take 1 tablet (50 mg total) by mouth daily. 30 tablet 2 Unknown at Unknown time   Allergies: is allergic to penicillins, amoxicillin, and doxycycline. OBHx:  OB History  [place]  Para Term Preterm AB Living  2 1 1     1   SAB TAB Ectopic Multiple Live Births          1    # Outcome Date GA Lbr Len/2nd Weight Sex Delivery Anes PTL Lv  2 Current           1 Term 10/23/16 [redacted]w[redacted]d  3572 g M Vag-Spont  N LIV   [redacted]w[redacted]d except as detailed in HPI.IDP:OEUMPNTI/[id number]  No family history of birth defects. Soc Hx: Alcohol: none and Recreational drug use: none  Objective:   Vitals:   04/12/20 0909 04/12/20 1309  BP: 134/75    Pulse: 89   Resp: 16 18  Temp: 98.4 F (36.9 C) 98.6 F (37 C)   Constitutional: Well nourished, well developed female in no acute distress.  HEENT: normal Skin: Warm and dry.  Cardiovascular:Regular rate and rhythm.   Extremity: trace to 1+ bilateral pedal edema Respiratory: Clear to auscultation bilateral. Normal respiratory effort Abdomen: gravid, ND, FHT present, moderate tenderness on exam Back: no CVAT Neuro: DTRs 2+, Cranial nerves grossly intact Psych: Alert and Oriented x3. No memory deficits. Normal mood and affect.  MS: normal gait, normal bilateral lower extremity ROM/strength/stability.  Pelvic exam: is not limited by body habitus EGBUS: within normal limits Vagina: within normal limits and with normal mucosa Cervix: CERVIX: 6 cm dilated Uterus: Spontaneous uterine activity  Adnexa: not evaluated  EFM:FHR: 140 bpm, variability: moderate,  accelerations:  Present,  decelerations:  Absent Toco: Frequency: Every 4-6 minutes   Perinatal info:  Blood type: A negative Rubella- Immune Varicella -Immune TDaP Given during third trimester of this pregnancy RPR NR / HIV Neg/ HBsAg Neg   Assessment & Plan:   24 y.o. G2P1001 @ [redacted]w[redacted]d, Admitted on 04/12/2020:Active labor    Admit for labor, Observe for cervical change, Fetal Wellbeing Reassuring, Epidural when ready and AROM when Appropriate  06/12/2020, MD, Annamarie Major Ob/Gyn, [facility] 04/12/2020  3:36 PM

## 2020-04-12 NOTE — Progress Notes (Signed)
  Labor Progress Note   24 y.o. G2P1001 @ [redacted]w[redacted]d , admitted for  Pregnancy, Labor Management.   Subjective:  Comfortable w epidural  Objective:  BP 137/74   Pulse (!) 102   Temp 97.8 F (36.6 C) (Oral)   Resp 18   LMP 07/16/2019 (Exact Date)   SpO2 95%  Abd: gravid, ND, FHT present, mild tenderness on exam Extr: trace to 1+ bilateral pedal edema SVE: CERVIX: 7 cm dilated, 70 effaced, -2 station  EFM: FHR: 140 bpm, variability: moderate,  accelerations:  Present,  decelerations:  Absent Toco: Frequency: Every 3-5 minutes on Pitocin 2 mU/min Labs: I have reviewed the patient's lab results.   Assessment & Plan:  G2P1001 @ [redacted]w[redacted]d, admitted for  Pregnancy and Labor/Delivery Management  1. Pain management: epidural. 2. FWB: FHT category 1.  3. ID: GBS negative 4. Labor management: AROM clear.  Cont Pitocin and active mgt of labor  All discussed with patient, see orders  Annamarie Major, MD, Merlinda Frederick Ob/Gyn, [facility] 04/12/2020  7:45 PM

## 2020-04-12 NOTE — Telephone Encounter (Signed)
Patient is calling with vaginal bleeding yesterday when she went to the rest room. Patient reports still having mucus but not bleeding. Patient is about [redacted] weeks pregnant. Patient is schedule today at 3. Currently no opening this morning. Please advise

## 2020-04-12 NOTE — Progress Notes (Signed)
  Labor Progress Note   24 y.o. G2P1001 @ [redacted]w[redacted]d , admitted to observation for rule out labor  Subjective:  Coping well with infrequent contractions. Has made a small amount of cervical change in 2 hours. Offered 2 additional hours of walking to r/o labor vs going home to await labor/or planned elective induction of labor that she requests at 39 weeks.  Objective:  BP 134/75 (BP Location: Left Arm)   Pulse 89   Temp 98.4 F (36.9 C) (Oral)   Resp 16   LMP 07/16/2019 (Exact Date)  Abd: gravid, ND, FHT present, mild tenderness on exam Extr: trace to 1+ bilateral pedal edema SVE: CERVIX: 5 cm dilated, 70 effaced, -2, -3 station  EFM: FHR: 115 bpm, variability: moderate,  accelerations:  Present,  decelerations:  Absent Toco: Frequency: Every 8-10 minutes, was every 5-6 on admission to Obs Labs: I have reviewed the patient's lab results.   Assessment & Plan:  G2P1001 @ 106w1d, admitted for  Pregnancy and Labor/Delivery Management  1. Pain management: none. 2. FWB: FHT category I.  3. ID: GBS negative 4. Labor management: Recheck cervix in 2 hours to evaluate for labor admission  All discussed with patient, see orders   Tresea Mall, CNM [facility] 04/12/2020  11:46 AM

## 2020-04-12 NOTE — Discharge Instructions (Signed)

## 2020-04-12 NOTE — Telephone Encounter (Signed)
Pt states she is having a lot of pressure; feels like there's a bowling ball on top of her crotch; no bleeding today just mucus; states is having like real bad period cramps in her back that come around to the front; feels like baby balls up; when that happens she has to stop what she's doing 'til it stops; that happens about q34min. Adv to go to L&D via ED; Jacki Cones aware.

## 2020-04-12 NOTE — Anesthesia Preprocedure Evaluation (Signed)
Anesthesia Evaluation  Patient identified by MRN, date of birth, ID band Patient awake    Reviewed: Allergy & Precautions, H&P , NPO status , Patient's Chart, lab work & pertinent test results  History of Anesthesia Complications Negative for: history of anesthetic complications  Airway Mallampati: I  TM Distance: >3 FB Neck ROM: Full    Dental no notable dental hx.    Pulmonary neg pulmonary ROS, neg sleep apnea, neg COPD,    Pulmonary exam normal breath sounds clear to auscultation       Cardiovascular Exercise Tolerance: Good (-) hypertension(-) CAD, (-) Past MI, (-) Cardiac Stents and (-) CABG  Rhythm:Regular Rate:Normal - Systolic murmurs    Neuro/Psych  Headaches, neg Seizures PSYCHIATRIC DISORDERS Anxiety Depression    GI/Hepatic negative GI ROS, Neg liver ROS,   Endo/Other  negative endocrine ROSneg diabetes  Renal/GU negative Renal ROS     Musculoskeletal negative musculoskeletal ROS (+)   Abdominal (+) + obese,   Peds  Hematology negative hematology ROS (+)   Anesthesia Other Findings Past Medical History: No date: Allergy No date: Anxiety No date: HA (headache)   Reproductive/Obstetrics                             Anesthesia Physical  Anesthesia Plan  ASA: II  Anesthesia Plan: Epidural   Post-op Pain Management:    Induction: Intravenous  PONV Risk Score and Plan: 2 and Propofol infusion  Airway Management Planned: Natural Airway  Additional Equipment:   Intra-op Plan:   Post-operative Plan:   Informed Consent: I have reviewed the patients History and Physical, chart, labs and discussed the procedure including the risks, benefits and alternatives for the proposed anesthesia with the patient or authorized representative who has indicated his/her understanding and acceptance.     Dental advisory given  Plan Discussed with: Anesthesiologist  Anesthesia  Plan Comments:         Anesthesia Quick Evaluation

## 2020-04-13 ENCOUNTER — Encounter: Payer: Self-pay | Admitting: Obstetrics & Gynecology

## 2020-04-13 LAB — RPR: RPR Ser Ql: NONREACTIVE

## 2020-04-13 LAB — CBC
HCT: 34.5 % — ABNORMAL LOW (ref 36.0–46.0)
Hemoglobin: 11.8 g/dL — ABNORMAL LOW (ref 12.0–15.0)
MCH: 29.1 pg (ref 26.0–34.0)
MCHC: 34.2 g/dL (ref 30.0–36.0)
MCV: 85 fL (ref 80.0–100.0)
Platelets: 197 10*3/uL (ref 150–400)
RBC: 4.06 MIL/uL (ref 3.87–5.11)
RDW: 14.6 % (ref 11.5–15.5)
WBC: 12.8 10*3/uL — ABNORMAL HIGH (ref 4.0–10.5)
nRBC: 0 % (ref 0.0–0.2)

## 2020-04-13 MED ORDER — SODIUM CHLORIDE 0.9% FLUSH: 3.0000 mL | INTRAVENOUS | Status: DC | PRN

## 2020-04-13 MED ORDER — IBUPROFEN 600 MG PO TABS
600.0000 mg | ORAL_TABLET | Freq: Four times a day (QID) | ORAL | Status: DC
  Administered 2020-04-13 – 2020-04-14 (×6): 600 mg via ORAL
  Filled 2020-04-13 (×6): qty 1

## 2020-04-13 MED ORDER — SODIUM CHLORIDE 0.9% FLUSH: 3.0000 mL | Freq: Two times a day (BID) | INTRAVENOUS | Status: DC

## 2020-04-13 MED ORDER — ACETAMINOPHEN 325 MG PO TABS: 650.0000 mg | ORAL_TABLET | ORAL | Status: DC | PRN

## 2020-04-13 MED ORDER — OXYCODONE-ACETAMINOPHEN 5-325 MG PO TABS: 1.0000 | ORAL_TABLET | ORAL | Status: DC | PRN

## 2020-04-13 MED ORDER — ONDANSETRON HCL 4 MG PO TABS: 4.0000 mg | ORAL_TABLET | ORAL | Status: DC | PRN

## 2020-04-13 MED ORDER — ZOLPIDEM TARTRATE 5 MG PO TABS: 5.0000 mg | ORAL_TABLET | Freq: Every evening | ORAL | Status: DC | PRN

## 2020-04-13 MED ORDER — DIBUCAINE (PERIANAL) 1 % EX OINT: 1.0000 "application " | TOPICAL_OINTMENT | CUTANEOUS | Status: DC | PRN

## 2020-04-13 MED ORDER — DIPHENHYDRAMINE HCL 25 MG PO CAPS: 25.0000 mg | ORAL_CAPSULE | Freq: Four times a day (QID) | ORAL | Status: DC | PRN

## 2020-04-13 MED ORDER — COCONUT OIL OIL
1.0000 "application " | TOPICAL_OIL | Status: DC | PRN
  Administered 2020-04-14: 1 via TOPICAL
  Filled 2020-04-13: qty 120

## 2020-04-13 MED ORDER — ONDANSETRON HCL 4 MG/2ML IJ SOLN: 4.0000 mg | INTRAMUSCULAR | Status: DC | PRN

## 2020-04-13 MED ORDER — BENZOCAINE-MENTHOL 20-0.5 % EX AERO: 1.0000 "application " | INHALATION_SPRAY | CUTANEOUS | Status: DC | PRN

## 2020-04-13 MED ORDER — WITCH HAZEL-GLYCERIN EX PADS: 1.0000 "application " | MEDICATED_PAD | CUTANEOUS | Status: DC | PRN

## 2020-04-13 MED ORDER — OXYCODONE-ACETAMINOPHEN 5-325 MG PO TABS: 2.0000 | ORAL_TABLET | ORAL | Status: DC | PRN

## 2020-04-13 MED ORDER — SENNOSIDES-DOCUSATE SODIUM 8.6-50 MG PO TABS
2.0000 | ORAL_TABLET | ORAL | Status: DC
  Administered 2020-04-13: 2 via ORAL
  Filled 2020-04-13 (×2): qty 2

## 2020-04-13 MED ORDER — SIMETHICONE 80 MG PO CHEW: 80.0000 mg | CHEWABLE_TABLET | ORAL | Status: DC | PRN

## 2020-04-13 MED ORDER — SODIUM CHLORIDE 0.9 % IV SOLN: 250.0000 mL | INTRAVENOUS | Status: DC | PRN

## 2020-04-13 NOTE — Progress Notes (Signed)
Subjective:  Doing well postpartum day 1: She is tolerating regular diet. Her pain is controlled with PO medication. She is ambulating and voiding without difficulty. She reports breastfeeding is going well.  Objective:  Vital signs in last 24 hours: Temp:  [97.8 F (36.6 C)-98.6 F (37 C)] 98.3 F (36.8 C) (09/03 0721) Pulse Rate:  [79-126] 80 (09/03 0721) Resp:  [16-20] 18 (09/03 0721) BP: (93-144)/(51-95) 111/86 (09/03 0721) SpO2:  [93 %-100 %] 97 % (09/03 0721)    General: NAD Pulmonary: no increased work of breathing Abdomen: non-distended, non-tender, fundus firm at level of umbilicus Extremities: no edema, no erythema, no tenderness  Results for orders placed or performed during the hospital encounter of 04/12/20 (from the past 72 hour(s))  Urinalysis, Complete w Microscopic Urine, Clean Catch     Status: Abnormal   Collection Time: 04/12/20  9:21 AM  Result Value Ref Range   Color, Urine AMBER (A) YELLOW    Comment: BIOCHEMICALS MAY BE AFFECTED BY COLOR   APPearance CLOUDY (A) CLEAR   Specific Gravity, Urine 1.020 1.005 - 1.030   pH 6.0 5.0 - 8.0   Glucose, UA 50 (A) NEGATIVE mg/dL   Hgb urine dipstick LARGE (A) NEGATIVE   Bilirubin Urine NEGATIVE NEGATIVE   Ketones, ur 5 (A) NEGATIVE mg/dL   Protein, ur 30 (A) NEGATIVE mg/dL   Nitrite NEGATIVE NEGATIVE   Leukocytes,Ua TRACE (A) NEGATIVE   RBC / HPF >50 (H) 0 - 5 RBC/hpf   WBC, UA 6-10 0 - 5 WBC/hpf   Bacteria, UA MANY (A) NONE SEEN   Squamous Epithelial / LPF 11-20 0 - 5   Mucus PRESENT    Ca Oxalate Crys, UA PRESENT    Sperm, UA PRESENT     Comment: Performed at [facility], [address] [place], [place] [address]  SARS Coronavirus 2 by RT PCR (hospital order, performed in [facility] hospital lab) Nasopharyngeal Nasopharyngeal Swab     Status: None   Collection Time: 04/12/20  1:32 PM   Specimen: Nasopharyngeal Swab  Result Value Ref Range   SARS Coronavirus 2 NEGATIVE NEGATIVE    Comment:  (NOTE) SARS-CoV-2 target nucleic acids are NOT DETECTED.  The SARS-CoV-2 RNA is generally detectable in upper and lower respiratory specimens during the acute phase of infection. The lowest concentration of SARS-CoV-2 viral copies this assay can detect is 250 copies / mL. A negative result does not preclude SARS-CoV-2 infection and should not be used as the sole basis for treatment or other patient management decisions.  A negative result may occur with improper specimen collection / handling, submission of specimen other than nasopharyngeal swab, presence of viral mutation(s) within the areas targeted by this assay, and inadequate number of viral copies (<250 copies / mL). A negative result must be combined with clinical observations, patient history, and epidemiological information.  Fact Sheet for Patients:   BoilerBrush.com.cy  Fact Sheet for Healthcare Providers: https://pope.com/  This test is not yet approved or  cleared by the [place] FDA and has been authorized for detection and/or diagnosis of SARS-CoV-2 by FDA under an Emergency Use Authorization (EUA).  This EUA will remain in effect (meaning this test can be used) for the duration of the COVID-19 declaration under Section 564(b)(1) of the Act, 21 U.S.C. section 360bbb-3(b)(1), unless the authorization is terminated or revoked sooner.  Performed at [facility], [address] [place], [place] [address]   CBC with Differential/Platelet  Status: Abnormal   Collection Time: 04/12/20  2:00 PM  Result Value Ref Range   WBC 11.0 (H) 4.0 - 10.5 K/uL   RBC 4.20 3.87 - 5.11 MIL/uL   Hemoglobin 12.1 12.0 - 15.0 g/dL   HCT 16.1 36 - 46 %   MCV 86.9 80.0 - 100.0 fL   MCH 28.8 26.0 - 34.0 pg   MCHC 33.2 30.0 - 36.0 g/dL   RDW 09.6 04.5 - 40.9 %   Platelets 215 150 - 400 K/uL   nRBC 0.0 0.0 - 0.2 %   Neutrophils Relative % 76 %   Neutro Abs 8.5 (H) 1.7 -  7.7 K/uL   Lymphocytes Relative 16 %   Lymphs Abs 1.8 0.7 - 4.0 K/uL   Monocytes Relative 6 %   Monocytes Absolute 0.6 0 - 1 K/uL   Eosinophils Relative 1 %   Eosinophils Absolute 0.1 0 - 0 K/uL   Basophils Relative 0 %   Basophils Absolute 0.0 0 - 0 K/uL   Immature Granulocytes 1 %   Abs Immature Granulocytes 0.06 0.00 - 0.07 K/uL    Comment: Performed at [facility], [address] [place], [place] [address]  Type and screen Se [facility]     Status: None   Collection Time: 04/12/20  2:00 PM  Result Value Ref Range   ABO/RH(D) A NEG    Antibody Screen NEG    Sample Expiration      04/15/2020,2359 Performed at [facility], [address] [place], [place] [address]   ABO/Rh     Status: None   Collection Time: 04/12/20  6:51 PM  Result Value Ref Range   ABO/RH(D)      A NEG Performed at [facility], [address] [place], [place] [address]   CBC     Status: Abnormal   Collection Time: 04/13/20  4:27 AM  Result Value Ref Range   WBC 12.8 (H) 4.0 - 10.5 K/uL   RBC 4.06 3.87 - 5.11 MIL/uL   Hemoglobin 11.8 (L) 12.0 - 15.0 g/dL   HCT 08.6 (L) 36 - 46 %   MCV 85.0 80.0 - 100.0 fL   MCH 29.1 26.0 - 34.0 pg   MCHC 34.2 30.0 - 36.0 g/dL   RDW 57.8 46.9 - 62.9 %   Platelets 197 150 - 400 K/uL   nRBC 0.0 0.0 - 0.2 %    Comment: Performed at [facility], [address], [place], [place] [address]    Assessment:   24 y.o. [phone] postpartum day # 1, lactating  Plan:    1) Acute blood loss anemia - hemodynamically stable and asymptomatic - po ferrous sulfate  2) Blood Type --/--/A NEG Performed at [facility], [address] [place], [place] [address]  [phone]/02 [id number]) / Newborn blood type: A negative. Rhogam not indicated.  3) Rubella 3.06 (01/18 1107) / Varicella Immune  4) TDAP status up to date  5) Feeding plan breast  6)  Education given regarding options for contraception, as well as  compatibility with breast feeding if applicable.  Patient plans on Mirena IUD for contraception.  7) Disposition: continue current care   Tresea Mall, CNM [facility] 04/13/2020, 10:43 AM

## 2020-04-13 NOTE — Anesthesia Postprocedure Evaluation (Signed)
Anesthesia Post Note  Patient: Madison Mack  Procedure(s) Performed: AN AD HOC LABOR EPIDURAL  Patient location during evaluation: Mother Baby Anesthesia Type: Epidural Level of consciousness: awake, oriented and awake and alert Pain management: pain level controlled Vital Signs Assessment: post-procedure vital signs reviewed and stable Respiratory status: spontaneous breathing and respiratory function stable Cardiovascular status: blood pressure returned to baseline and stable Postop Assessment: no headache, no backache, no apparent nausea or vomiting and able to ambulate Anesthetic complications: no   No complications documented.   Last Vitals:  Vitals:   04/13/20 0402 04/13/20 0721  BP: 119/69 111/86  Pulse: 95 80  Resp: 18 18  Temp: 36.9 C 36.8 C  SpO2: 97% 97%    Last Pain:  Vitals:   04/13/20 0721  TempSrc: Oral  PainSc:                  Ginger Carne

## 2020-04-13 NOTE — Lactation Note (Signed)
This note was copied from a baby's chart. Lactation Consultation Note  Patient Name: [name] Today's Date: 04/13/2020 Reason for consult: Initial assessment;Early term 37-38.6wks   Maternal Data Formula Feeding for Exclusion: No Has patient been taught Hand Expression?: Yes Does the patient have breastfeeding experience prior to this delivery?: Yes  Feeding Feeding Type: Breast Fed Baby latched easily to left breast with mom in side lying position., some swallows heard, encouraged to offer both breasts at a feeding, stimulation to stay awake at breast and to continue to nurse  [facility] Score Latch: Grasps breast easily, tongue down, lips flanged, rhythmical sucking.  Audible Swallowing: A few with stimulation  Type of Nipple: Everted at rest and after stimulation  Comfort (Breast/Nipple): Soft / non-tender  Hold (Positioning): Assistance needed to correctly position infant at breast and maintain latch.  LATCH Score: 8  Interventions Interventions: Breast feeding basics reviewed;Assisted with latch;Skin to skin;Adjust position;Support pillows  Lactation Tools Discussed/Used WIC Program: No LC name and no written on white board  Consult Status Consult Status: PRN    Dyann Kief 04/13/2020, 2:58 PM

## 2020-04-14 MED ORDER — IBUPROFEN 600 MG PO TABS
600.0000 mg | ORAL_TABLET | Freq: Four times a day (QID) | ORAL | 0 refills | Status: DC
Start: 2020-04-14 — End: 2020-06-26

## 2020-04-14 NOTE — Progress Notes (Signed)
Discharge instructions, prescriptions, education, and appointments given and explained. Pt verbalized understanding with no further questions. Pt wheeled to personal vehicle via staff 

## 2020-04-20 ENCOUNTER — Ambulatory Visit (INDEPENDENT_AMBULATORY_CARE_PROVIDER_SITE_OTHER): Payer: BC Managed Care – PPO | Admitting: Obstetrics & Gynecology

## 2020-04-20 ENCOUNTER — Other Ambulatory Visit: Payer: Self-pay

## 2020-04-20 ENCOUNTER — Encounter: Payer: Self-pay | Admitting: Obstetrics & Gynecology

## 2020-04-20 VITALS — BP 128/80 | Ht 69.0 in | Wt 246.0 lb

## 2020-04-20 DIAGNOSIS — Z1332 Encounter for screening for maternal depression: Secondary | ICD-10-CM

## 2020-04-20 NOTE — Progress Notes (Signed)
  History of Present Illness:  Madison Mack is a 24 y.o. who was delivered on 04/12/2020 and has been doing very well.  Risk factors for PPD.  She denies anxiety, difficulties w infant, distress, depression, neglect.  Reports good support sytem.  Bottle feeding, and no breast concerns.  Plans IUD for birth control  PMHx: She  has a past medical history of Allergy, Anxiety, HA (headache), and Nausea & vomiting. Also,  has a past surgical history that includes Ankle fracture surgery (Right, 04/08); Esophagogastroduodenoscopy (egd) with propofol (N/A, 08/10/2019); and Ankle arthodesis w/ arthroscopy (Right)., family history includes ADD / ADHD in her maternal aunt; Alcohol abuse in her maternal aunt and maternal uncle; Anxiety disorder in her maternal aunt and paternal grandfather; Dementia in her paternal grandmother; Depression in her paternal grandfather; Drug abuse in her maternal aunt and maternal uncle; Hyperlipidemia in her father; Hypertension in her mother.,  reports that she has never smoked. She has never used smokeless tobacco. She reports that she does not drink alcohol and does not use drugs. Current Meds  Medication Sig  . hydrOXYzine (ATARAX/VISTARIL) 25 MG tablet Take 1 tablet (25 mg total) by mouth 3 (three) times daily as needed for anxiety.  Marland Kitchen ibuprofen (ADVIL) 600 MG tablet Take 1 tablet (600 mg total) by mouth every 6 (six) hours.  . metoCLOPramide (REGLAN) 10 MG tablet Take 10 mg by mouth 4 (four) times daily.  . sertraline (ZOLOFT) 50 MG tablet Take 1 tablet (50 mg total) by mouth daily.  . Also, is allergic to penicillins, amoxicillin, and doxycycline..  Review of Systems  All other systems reviewed and are negative.   Physical Exam:  BP 128/80   Ht 5\' 9"  (1.753 m)   Wt 246 lb (111.6 kg)   BMI 36.33 kg/m  Body mass index is 36.33 kg/m. Constitutional: Well nourished, well developed female in no acute distress.  Abdomen: diffusely non tender to palpation, non  distended, and no masses, hernias Neuro: Grossly intact Psych:  Normal mood and affect.    Assessment:  Problem List Items Addressed This Visit    Encounter for screening for maternal depression    -  Primary    Screening tests all neg for anxiety or depression today Plans IUD for BC (nv) Infant doing well No breast concerns  A total of 20 minutes were spent face-to-face with the patient as well as preparation, review, communication, and documentation during this encounter.   , MD, Annamarie Major Ob/Gyn, [facility] 04/20/2020  3:58 PM

## 2020-05-28 ENCOUNTER — Ambulatory Visit (INDEPENDENT_AMBULATORY_CARE_PROVIDER_SITE_OTHER): Payer: BC Managed Care – PPO | Admitting: Obstetrics & Gynecology

## 2020-05-28 ENCOUNTER — Other Ambulatory Visit: Payer: Self-pay

## 2020-05-28 ENCOUNTER — Encounter: Payer: Self-pay | Admitting: Obstetrics & Gynecology

## 2020-05-28 VITALS — BP 120/80 | Ht 69.0 in | Wt 248.0 lb

## 2020-05-28 DIAGNOSIS — Z30430 Encounter for insertion of intrauterine contraceptive device: Secondary | ICD-10-CM

## 2020-05-28 NOTE — Progress Notes (Signed)
  OBSTETRICS POSTPARTUM CLINIC PROGRESS NOTE  Subjective:     Dionisia Pacholski is a 24 y.o. [phone] female who presents for a postpartum visit. She is 6 week postpartum following a Term pregnancy and delivery by Vaginal, no problems at delivery.  I have fully reviewed the prenatal and intrapartum course. Anesthesia: epidural.  Postpartum course has been complicated by uncomplicated.  Baby is feeding by Bottle.  Bleeding: patient has not  resumed menses.  Bowel function is normal. Bladder function is normal.  Patient is not sexually active. Contraception method desired is IUD.  Postpartum depression screening: negative. Edinburgh 3.  The following portions of the patient's history were reviewed and updated as appropriate: allergies, current medications, past family history, past medical history, past social history, past surgical history and problem list.  Review of Systems Pertinent items are noted in HPI.  Objective:    BP 120/80   Ht 5\' 9"  (1.753 m)   Wt 248 lb (112.5 kg)   LMP 05/14/2020   BMI 36.62 kg/m   General:  alert and no distress   Breasts:  inspection negative, no nipple discharge or bleeding, no masses or nodularity palpable  Lungs: clear to auscultation bilaterally  Heart:  regular rate and rhythm, S1, S2 normal, no murmur, click, rub or gallop  Abdomen: soft, non-tender; bowel sounds normal; no masses,  no organomegaly.     Vulva:  normal  Vagina: normal vagina, no discharge, exudate, lesion, or erythema  Cervix:  no cervical motion tenderness and no lesions  Corpus: normal size, contour, position, consistency, mobility, non-tender  Adnexa:  normal adnexa and no mass, fullness, tenderness  Rectal Exam: Not performed.          Assessment:  Post Partum Care visit 1. Encounter for insertion of intrauterine contraceptive device (IUD)  2. Postpartum care and examination   Plan:  See orders and Patient Instructions Contraceptive counseling for IUD Resume  all normal activities Follow up in: 4 weeks or as needed.    IUD PROCEDURE NOTE:  Ceylin Dreibelbis is a 24 y.o. [phone] here for IUD insertion. No GYN concerns.  Last pap smear was normal.  IUD Insertion Procedure Note Patient identified, informed consent performed, consent signed.   Discussed risks of irregular bleeding, cramping, infection, malpositioning or misplacement of the IUD outside the uterus which may require further procedure such as laparoscopy, risk of failure <1%. Time out was performed.  Urine pregnancy test negative.  A bimanual exam showed the uterus to be midposition.  Speculum placed in the vagina.  Cervix visualized.  Cleaned with Betadine x 2.  Grasped anteriorly with a single tooth tenaculum.  Uterus sounded to 8 cm.   Mirena IUD placed per manufacturer's recommendations.  Strings trimmed to 3 cm. Tenaculum was removed, good hemostasis noted.  Patient tolerated procedure well.   Patient was given post-procedure instructions.  She was advised to have backup contraception for one week.  Patient was also asked to check IUD strings periodically and follow up in 4 weeks for IUD check.  [id number], MD, [facility], [facility] 05/28/2020  8:38 AM

## 2020-05-28 NOTE — Patient Instructions (Signed)
Intrauterine Device Insertion, Care After  This sheet gives you information about how to care for yourself after your procedure. Your health care provider may also give you more specific instructions. If you have problems or questions, contact your health care provider. What can I expect after the procedure? After the procedure, it is common to have:  Cramps and pain in the abdomen.  Light bleeding (spotting) or heavier bleeding that is like your menstrual period. This may last for up to a few days.  Lower back pain.  Dizziness.  Headaches.  Nausea. Follow these instructions at home:  Before resuming sexual activity, check to make sure that you can feel the IUD string(s). You should be able to feel the end of the string(s) below the opening of your cervix. If your IUD string is in place, you may resume sexual activity. ? If you had a hormonal IUD inserted more than 7 days after your most recent period started, you will need to use a backup method of birth control for 7 days after IUD insertion. Ask your health care provider whether this applies to you.  Continue to check that the IUD is still in place by feeling for the string(s) after every menstrual period, or once a month.  Take over-the-counter and prescription medicines only as told by your health care provider.  Do not drive or use heavy machinery while taking prescription pain medicine.  Keep all follow-up visits as told by your health care provider. This is important. Contact a health care provider if:  You have bleeding that is heavier or lasts longer than a normal menstrual cycle.  You have a fever.  You have cramps or abdominal pain that get worse or do not get better with medicine.  You develop abdominal pain that is new or is not in the same area of earlier cramping and pain.  You feel lightheaded or weak.  You have abnormal or bad-smelling discharge from your vagina.  You have pain during sexual  activity.  You have any of the following problems with your IUD string(s): ? The string bothers or hurts you or your sexual partner. ? You cannot feel the string. ? The string has gotten longer.  You can feel the IUD in your vagina.  You think you may be pregnant, or you miss your menstrual period.  You think you may have an STI (sexually transmitted infection). Get help right away if:  You have flu-like symptoms.  You have a fever and chills.  You can feel that your IUD has slipped out of place. Summary  After the procedure, it is common to have cramps and pain in the abdomen. It is also common to have light bleeding (spotting) or heavier bleeding that is like your menstrual period.  Continue to check that the IUD is still in place by feeling for the string(s) after every menstrual period, or once a month.  Keep all follow-up visits as told by your health care provider. This is important.  Contact your health care provider if you have problems with your IUD string(s), such as the string getting longer or bothering you or your sexual partner. This information is not intended to replace advice given to you by your health care provider. Make sure you discuss any questions you have with your health care provider. Document Revised: 07/10/2017 Document Reviewed: 06/18/2016 Elsevier Patient Education  2020 Elsevier Inc.  

## 2020-06-25 ENCOUNTER — Ambulatory Visit: Payer: BC Managed Care – PPO | Admitting: Obstetrics & Gynecology

## 2020-06-26 ENCOUNTER — Encounter: Payer: Self-pay | Admitting: Family Medicine

## 2020-06-26 ENCOUNTER — Other Ambulatory Visit: Payer: Self-pay

## 2020-06-26 ENCOUNTER — Ambulatory Visit (INDEPENDENT_AMBULATORY_CARE_PROVIDER_SITE_OTHER): Payer: BC Managed Care – PPO | Admitting: Family Medicine

## 2020-06-26 VITALS — BP 110/65 | HR 66 | Temp 97.8°F | Resp 16 | Ht 69.0 in | Wt 247.0 lb

## 2020-06-26 DIAGNOSIS — G5602 Carpal tunnel syndrome, left upper limb: Secondary | ICD-10-CM | POA: Diagnosis not present

## 2020-06-26 MED ORDER — NAPROXEN 500 MG PO TABS: 500.0000 mg | ORAL_TABLET | Freq: Two times a day (BID) | ORAL | 0 refills | Status: DC

## 2020-06-26 NOTE — Progress Notes (Signed)
Subjective:    Patient ID: Madison Mack, female    DOB: 1996-02-08, 24 y.o.   MRN: [id number]  Madison Mack is a 24 y.o. female presenting on 06/26/2020 for Carpal Tunnel (onset 2 months tingling and numbness)   HPI   Left Hand, Carpal Tunnel Syndrome Reports symptoms onset after recent pregnancy, she said symptoms onset with Right hand fell asleep with paresthesia and pins and needles often, often will trigger episodes of pins and needles sensation, if numb persistent can get pain shooting in arm, usually affecting primarily the Left thumb and index finger. Admits some weaker grip and has dropped things more often. She works as Runner, broadcasting/film/video, and often talking with hands or writing and says symptoms can be provoked doing these activities. - Not taking regular OTC medication. Denies any redness, injury, other finger numbness or pain  Depression screen [facility] 2/9 04/20/2020 04/19/2019 03/17/2019  Decreased Interest 0 0 0  Down, Depressed, Hopeless 0 1 0  PHQ - 2 Score 0 1 0  Altered sleeping 0 0 -  Tired, decreased energy 0 3 -  Change in appetite 0 0 -  Feeling bad or failure about yourself  0 0 -  Trouble concentrating 0 1 -  Moving slowly or fidgety/restless 0 0 -  Suicidal thoughts 0 0 -  PHQ-9 Score 0 5 -  Difficult doing work/chores - Not difficult at all -    Social History   Tobacco Use  . Smoking status: Never Smoker  . Smokeless tobacco: Never Used  Vaping Use  . Vaping Use: Never used  Substance Use Topics  . Alcohol use: No    Alcohol/week: 0.0 standard drinks  . Drug use: No    Review of Systems Per HPI unless specifically indicated above     Objective:    BP 110/65   Pulse 66   Temp 97.8 F (36.6 C) (Temporal)   Resp 16   Ht 5\' 9"  (1.753 m)   Wt 247 lb (112 kg)   SpO2 100%   BMI 36.48 kg/m   Wt Readings from Last 3 Encounters:  06/26/20 247 lb (112 kg)  05/28/20 248 lb (112.5 kg)  04/20/20 246 lb (111.6 kg)    Physical Exam Vitals and  nursing note reviewed.  Constitutional:      General: She is not in acute distress.    Appearance: She is well-developed. She is not diaphoretic.     Comments: Well-appearing, comfortable, cooperative  HENT:     Head: Normocephalic and atraumatic.  Eyes:     General:        Right eye: No discharge.        Left eye: No discharge.     Conjunctiva/sclera: Conjunctivae normal.  Cardiovascular:     Rate and Rhythm: Normal rate.  Pulmonary:     Effort: Pulmonary effort is normal.  Musculoskeletal:     Comments: Left Hand/Wrist Inspection: Normal appearance, symmetrical, no bulky MCP joints, no edema or erythema. Palpation: Non tender hand / wrist, carpal bones, including MCP, base of thumb. No distinct anatomical snuff box or scaphoid tenderness.  ROM: full active wrist ROM flex / ext, ulnar / radial deviation, without pain with radial deviation Special Testing:  Finkelstein's test Negative for pain. POSITIVE Tinel's median nerve for paresthesia Strength: 5/5 grip, thumb opposition, wrist flex/ext Neurovascular: distally intact   Skin:    General: Skin is warm and dry.     Findings: No erythema or rash.  Neurological:  Mental Status: She is alert and oriented to person, place, and time.  Psychiatric:        Behavior: Behavior normal.     Comments: Well groomed, good eye contact, normal speech and thoughts    Results for orders placed or performed during the hospital encounter of 04/12/20  SARS Coronavirus 2 by RT PCR (hospital order, performed in [facility] hospital lab) Nasopharyngeal Nasopharyngeal Swab   Specimen: Nasopharyngeal Swab  Result Value Ref Range   SARS Coronavirus 2 NEGATIVE NEGATIVE  Urinalysis, Complete w Microscopic Urine, Clean Catch  Result Value Ref Range   Color, Urine AMBER (A) YELLOW   APPearance CLOUDY (A) CLEAR   Specific Gravity, Urine 1.020 1.005 - 1.030   pH 6.0 5.0 - 8.0   Glucose, UA 50 (A) NEGATIVE mg/dL   Hgb urine dipstick LARGE (A)  NEGATIVE   Bilirubin Urine NEGATIVE NEGATIVE   Ketones, ur 5 (A) NEGATIVE mg/dL   Protein, ur 30 (A) NEGATIVE mg/dL   Nitrite NEGATIVE NEGATIVE   Leukocytes,Ua TRACE (A) NEGATIVE   RBC / HPF >50 (H) 0 - 5 RBC/hpf   WBC, UA 6-10 0 - 5 WBC/hpf   Bacteria, UA MANY (A) NONE SEEN   Squamous Epithelial / LPF 11-20 0 - 5   Mucus PRESENT    Ca Oxalate Crys, UA PRESENT    Sperm, UA PRESENT   CBC with Differential/Platelet  Result Value Ref Range   WBC 11.0 (H) 4.0 - 10.5 K/uL   RBC 4.20 3.87 - 5.11 MIL/uL   Hemoglobin 12.1 12.0 - 15.0 g/dL   HCT 25.4 36 - 46 %   MCV 86.9 80.0 - 100.0 fL   MCH 28.8 26.0 - 34.0 pg   MCHC 33.2 30.0 - 36.0 g/dL   RDW 27.0 62.3 - 76.2 %   Platelets 215 150 - 400 K/uL   nRBC 0.0 0.0 - 0.2 %   Neutrophils Relative % 76 %   Neutro Abs 8.5 (H) 1.7 - 7.7 K/uL   Lymphocytes Relative 16 %   Lymphs Abs 1.8 0.7 - 4.0 K/uL   Monocytes Relative 6 %   Monocytes Absolute 0.6 0.1 - 1.0 K/uL   Eosinophils Relative 1 %   Eosinophils Absolute 0.1 0.0 - 0.5 K/uL   Basophils Relative 0 %   Basophils Absolute 0.0 0.0 - 0.1 K/uL   Immature Granulocytes 1 %   Abs Immature Granulocytes 0.06 0.00 - 0.07 K/uL  RPR  Result Value Ref Range   RPR Ser Ql NON REACTIVE NON REACTIVE  CBC  Result Value Ref Range   WBC 12.8 (H) 4.0 - 10.5 K/uL   RBC 4.06 3.87 - 5.11 MIL/uL   Hemoglobin 11.8 (L) 12.0 - 15.0 g/dL   HCT 83.1 (L) 36 - 46 %   MCV 85.0 80.0 - 100.0 fL   MCH 29.1 26.0 - 34.0 pg   MCHC 34.2 30.0 - 36.0 g/dL   RDW 51.7 61.6 - 07.3 %   Platelets 197 150 - 400 K/uL   nRBC 0.0 0.0 - 0.2 %  Type and screen [facility]  Result Value Ref Range   ABO/RH(D) A NEG    Antibody Screen NEG    Sample Expiration      04/15/2020,2359 Performed at [facility], [address], [place], [place] [address]   ABO/Rh  Result Value Ref Range   ABO/RH(D)      A NEG Performed at [facility], [address]  [place], [place] [address]        Assessment & Plan:   Problem List Items Addressed This Visit    None    Visit Diagnoses    Left carpal tunnel syndrome    -  Primary   Relevant Medications   naproxen (NAPROSYN) 500 MG tablet      Clinically most consistent with acute L hand carpal tunnel syndrome. Classic location of symptoms affecting median nerve now 2 months after pregnancy as risk factor Limited conservative therapy tried so far  Plan: 1. Discussed course of carpal tunnel syndrome, treatment, prognosis, complications 2. START NSAID trial with Naproxen 500mg  BID wc 1-2 weeks then PRN may extend up to 2-4 weeks, then PRN - Take Tylenol PRN breakthrough - Consider Prednisone if not improved on oral NSAID 3. START  wrist splint support (avoid flex/ext repetition) wear overnight to bed, may use during day with excessive or repetitive activities 4. Follow-up within 4-6 weeks if not improved consider prednisone burst, referral to Neurology for nerve conduction study   Meds ordered this encounter  Medications  . naproxen (NAPROSYN) 500 MG tablet    Sig: Take 1 tablet (500 mg total) by mouth 2 (two) times daily with a meal. For 1-2 weeks then as needed.    Dispense:  60 tablet    Refill:  0      Follow up plan: Return in about 4 weeks (around 07/24/2020) for 4 weeks as needed carpal tunnel.   07/26/2020, DO [facility] 06/26/2020, 4:01 PM

## 2020-06-26 NOTE — Patient Instructions (Addendum)
Thank you for coming to the office today.  You most likely have Carpal Tunnel Syndrome of Left hand/wrist based on your symptoms. This a problem of compression on the nerve entering the hand at the wrist. Often it is caused by long history of overuse or repetitive activities that put strain on the nerve within wrist. Occasionally this can be caused by swelling or weight gain and pressure on this nerve as well.  Wrist Splint - Carpal Tunnel Wrist Brace - basically limit wrist flex.  Recommend trial of Anti-inflammatory with Naproxen (Naprosyn) 500mg  tabs - take one with food and plenty of water TWICE daily every day (breakfast and dinner), for next 1 to 2 weeks, then you may take only as needed - DO NOT TAKE any ibuprofen, aleve, motrin while you are taking this medicine - It is safe to take Tylenol Ext Str 500mg  tabs - take 1 to 2 (max dose 1000mg ) every 6 hours as needed for breakthrough pain, max 24 hour daily dose is 6 to 8 tablets or 4000mg   IF not improving - contact office or message on mychart we can try a short course of Prednisone and HOLD naproxen at that time if you like.  Recommend to start taking Tylenol Extra Strength 500mg  tabs - take 1 to 2 tabs per dose (max 1000mg ) every 6-8 hours for pain (take regularly, don't skip a dose for next 7 days), max 24 hour daily dose is 6 tablets or 3000mg . In the future you can repeat the same everyday Tylenol course for 1-2 weeks at a time.   In future if needed We can refer to Neurologist - for Nerve Conduction Study and also consultation to discuss test results and treatment options  Consider steroid injection, other therapy and possibly referral to Orthopedic surgery for possible carpal tunnel release surgery as mentioned   Please schedule a Follow-up Appointment to: Return in about 4 weeks (around 07/24/2020) for 4 weeks as needed carpal tunnel.  If you have any other questions or concerns, please feel free to call the office or send a  message through MyChart. You may also schedule an earlier appointment if necessary.  Additionally, you may be receiving a survey about your experience at our office within a few days to 1 week by e-mail or mail. We value your feedback.  , DO [facility], [facility]   Carpal Tunnel Syndrome  Carpal tunnel syndrome is a condition that causes pain in your hand and arm. The carpal tunnel is a narrow area that is on the palm side of your wrist. Repeated wrist motion or certain diseases may cause swelling in the tunnel. This swelling can pinch the main nerve in the wrist (median nerve). What are the causes? This condition may be caused by:  Repeated wrist motions.  Wrist injuries.  Arthritis.  A sac of fluid (cyst) or abnormal growth (tumor) in the carpal tunnel.  Fluid buildup during pregnancy. Sometimes the cause is not known. What increases the risk? The following factors may make you more likely to develop this condition:  Having a job in which you move your wrist in the same way many times. This includes jobs like being a or a .  Being a woman.  Having other health conditions, such as: ? Diabetes. ? Obesity. ? A thyroid gland that is not active enough (hypothyroidism). ? Kidney failure. What are the signs or symptoms? Symptoms of this condition include:  A tingling feeling in your fingers.  Tingling or a loss of feeling (numbness) in your hand.  Pain in your entire arm. This pain may get worse when you bend your wrist and elbow for a long time.  Pain in your wrist that goes up your arm to your shoulder.  Pain that goes down into your palm or fingers.  A weak feeling in your hands. You may find it hard to grab and hold items. You may feel worse at night. How is this diagnosed? This condition is diagnosed with a medical history and physical exam. You may also have tests, such as:  Electromyogram (EMG). This test checks  the signals that the nerves send to the muscles.  Nerve conduction study. This test checks how well signals pass through your nerves.  Imaging tests, such as X-rays, ultrasound, and MRI. These tests check for what might be the cause of your condition. How is this treated? This condition may be treated with:  Lifestyle changes. You will be asked to stop or change the activity that caused your problem.  Doing exercise and activities that make bones and muscles stronger (physical therapy).  Learning how to use your hand again (occupational therapy).  Medicines for pain and swelling (inflammation). You may have injections in your wrist.  A wrist splint.  Surgery. Follow these instructions at home: If you have a splint:  Wear the splint as told by your doctor. Remove it only as told by your doctor.  Loosen the splint if your fingers: ? Tingle. ? Lose feeling (become numb). ? Turn cold and blue.  Keep the splint clean.  If the splint is not waterproof: ? Do not let it get wet. ? Cover it with a watertight covering when you take a bath or a shower. Managing pain, stiffness, and swelling   If told, put ice on the painful area: ? If you have a removable splint, remove it as told by your doctor. ? Put ice in a plastic bag. ? Place a towel between your skin and the bag. ? Leave the ice on for 20 minutes, 2-3 times per day. General instructions  Take over-the-counter and prescription medicines only as told by your doctor.  Rest your wrist from any activity that may cause pain. If needed, talk with your boss at work about changes that can help your wrist heal.  Do any exercises as told by your doctor, physical therapist, or occupational therapist.  Keep all follow-up visits as told by your doctor. This is important. Contact a doctor if:  You have new symptoms.  Medicine does not help your pain.  Your symptoms get worse. Get help right away if:  You have very bad  numbness or tingling in your wrist or hand. Summary  Carpal tunnel syndrome is a condition that causes pain in your hand and arm.  It is often caused by repeated wrist motions.  Lifestyle changes and medicines are used to treat this problem. Surgery may help in very bad cases.  Follow your doctor's instructions about wearing a splint, resting your wrist, keeping follow-up visits, and calling for help. This information is not intended to replace advice given to you by your health care provider. Make sure you discuss any questions you have with your health care provider. Document Revised: 12/04/2017 Document Reviewed: 12/04/2017 Elsevier Patient Education  2020 [employer].

## 2020-06-28 ENCOUNTER — Ambulatory Visit: Payer: Self-pay | Admitting: Family Medicine

## 2020-07-11 ENCOUNTER — Encounter: Payer: Self-pay | Admitting: Obstetrics & Gynecology

## 2020-07-11 ENCOUNTER — Ambulatory Visit (INDEPENDENT_AMBULATORY_CARE_PROVIDER_SITE_OTHER): Payer: BC Managed Care – PPO | Admitting: Obstetrics & Gynecology

## 2020-07-11 ENCOUNTER — Other Ambulatory Visit: Payer: Self-pay

## 2020-07-11 VITALS — BP 120/80 | Ht 68.0 in | Wt 247.0 lb

## 2020-07-11 DIAGNOSIS — Z30431 Encounter for routine checking of intrauterine contraceptive device: Secondary | ICD-10-CM

## 2020-07-11 MED ORDER — SERTRALINE HCL 50 MG PO TABS: 75.0000 mg | ORAL_TABLET | Freq: Every day | ORAL | 6 refills | Status: DC

## 2020-07-11 NOTE — Progress Notes (Signed)
  History of Present Illness:  Madison Mack is a 24 y.o. that had a Mirena IUD placed approximately 4 weeks ago. Since that time, she states that she has had no bleeding and pain  PMHx: She  has a past medical history of Allergy, Anxiety, HA (headache), and Nausea & vomiting. Also,  has a past surgical history that includes Ankle fracture surgery (Right, 04/08); Esophagogastroduodenoscopy (egd) with propofol (N/A, 08/10/2019); and Ankle arthodesis w/ arthroscopy (Right)., family history includes ADD / ADHD in her maternal aunt; Alcohol abuse in her maternal aunt and maternal uncle; Anxiety disorder in her maternal aunt and paternal grandfather; Dementia in her paternal grandmother; Depression in her paternal grandfather; Drug abuse in her maternal aunt and maternal uncle; Hyperlipidemia in her father; Hypertension in her mother.,  reports that she has never smoked. She has never used smokeless tobacco. She reports that she does not drink alcohol and does not use drugs. Current Meds  Medication Sig  . hydrOXYzine (ATARAX/VISTARIL) 25 MG tablet Take 1 tablet (25 mg total) by mouth 3 (three) times daily as needed for anxiety.  . metoCLOPramide (REGLAN) 10 MG tablet Take 10 mg by mouth 4 (four) times daily.   . naproxen (NAPROSYN) 500 MG tablet Take 1 tablet (500 mg total) by mouth 2 (two) times daily with a meal. For 1-2 weeks then as needed.  Marland Kitchen omeprazole (PRILOSEC) 20 MG capsule Take 20 mg by mouth daily.   . Prenatal Vit-Fe Fumarate-FA (MULTIVITAMIN-PRENATAL) 27-0.8 MG TABS tablet Take 1 tablet by mouth daily at 12 noon.   . [DISCONTINUED] sertraline (ZOLOFT) 50 MG tablet Take 1 tablet (50 mg total) by mouth daily.  .  Also, is allergic to penicillins, amoxicillin, and doxycycline..  Review of Systems  Psychiatric/Behavioral: Positive for depression. The patient is nervous/anxious.   All other systems reviewed and are negative.   Physical Exam:  BP 120/80   Ht 5\' 8"  (1.727 m)   Wt 247 lb  (112 kg)   LMP 06/11/2020   BMI 37.56 kg/m  Body mass index is 37.56 kg/m. Constitutional: Well nourished, well developed female in no acute distress.  Abdomen: diffusely non tender to palpation, non distended, and no masses, hernias Neuro: Grossly intact Psych:  Normal mood and affect.    Pelvic exam:  Two IUD strings present seen coming from the cervical os. EGBUS, vaginal vault and cervix: within normal limits  Assessment: IUD strings present in proper location; pt doing well  Plan: She was told to continue to use barrier contraception, in order to prevent any STIs, and to take a home pregnancy test or call 13/08/2019 if she ever thinks she may be pregnant, and that her IUD expires in 7 years.  Discussed concerns over worsening stress and anxiety.  Desires increase in Zoloft meds. Rec she go to 75 mg daily.  Re-assess in 6-8 weeks.  PAP due in Feb.  She was amenable to this plan and we will see her back in 1 year/PRN.  A total of 20 minutes were spent face-to-face with the patient as well as preparation, review, communication, and documentation during this encounter.   Mar, MD, [facility] Major [facility]/G[facility], [facility] 07/11/2020  4:39 PM

## 2020-07-27 ENCOUNTER — Other Ambulatory Visit: Payer: Self-pay

## 2020-07-27 ENCOUNTER — Emergency Department: Payer: BC Managed Care – PPO

## 2020-07-27 ENCOUNTER — Emergency Department
Admission: EM | Admit: 2020-07-27 | Discharge: 2020-07-27 | Disposition: A | Discharge status: Home / Self Care | Payer: BC Managed Care – PPO | Attending: Emergency Medicine | Admitting: Emergency Medicine

## 2020-07-27 DIAGNOSIS — R109 Unspecified abdominal pain: Secondary | ICD-10-CM

## 2020-07-27 DIAGNOSIS — M545 Low back pain, unspecified: Secondary | ICD-10-CM | POA: Insufficient documentation

## 2020-07-27 DIAGNOSIS — N200 Calculus of kidney: Secondary | ICD-10-CM

## 2020-07-27 DIAGNOSIS — R10A0 Flank pain, unspecified side: Secondary | ICD-10-CM

## 2020-07-27 LAB — COMPREHENSIVE METABOLIC PANEL
ALT: 19 U/L (ref 0–44)
AST: 19 U/L (ref 15–41)
Albumin: 4.3 g/dL (ref 3.5–5.0)
Alkaline Phosphatase: 66 U/L (ref 38–126)
Anion gap: 10 (ref 5–15)
BUN: 10 mg/dL (ref 6–20)
CO2: 22 mmol/L (ref 22–32)
Calcium: 9.1 mg/dL (ref 8.9–10.3)
Chloride: 106 mmol/L (ref 98–111)
Creatinine, Ser: 0.72 mg/dL (ref 0.44–1.00)
GFR, Estimated: >60 mL/min (ref >60)
Glucose, Bld: 122 mg/dL — ABNORMAL HIGH (ref 70–99)
Potassium: 3.5 mmol/L (ref 3.5–5.1)
Sodium: 138 mmol/L (ref 135–145)
Total Bilirubin: 0.9 mg/dL (ref 0.3–1.2)
Total Protein: 7.7 g/dL (ref 6.5–8.1)

## 2020-07-27 LAB — URINALYSIS, COMPLETE (UACMP) WITH MICROSCOPIC
Glucose, UA: NEGATIVE mg/dL
Ketones, ur: 80 mg/dL — AB
Nitrite: NEGATIVE
Protein, ur: 100 mg/dL — AB
Specific Gravity, Urine: 1.04 — ABNORMAL HIGH (ref 1.005–1.030)
WBC, UA: >50 WBC/hpf — ABNORMAL HIGH (ref 0–5)
pH: 5 (ref 5.0–8.0)

## 2020-07-27 LAB — CBC
HCT: 41.1 % (ref 36.0–46.0)
Hemoglobin: 13.9 g/dL (ref 12.0–15.0)
MCH: 28.7 pg (ref 26.0–34.0)
MCHC: 33.8 g/dL (ref 30.0–36.0)
MCV: 84.7 fL (ref 80.0–100.0)
Platelets: 266 10*3/uL (ref 150–400)
RBC: 4.85 MIL/uL (ref 3.87–5.11)
RDW: 14.3 % (ref 11.5–15.5)
WBC: 10.7 10*3/uL — ABNORMAL HIGH (ref 4.0–10.5)
nRBC: 0 % (ref 0.0–0.2)

## 2020-07-27 LAB — POC URINE PREG, ED: Preg Test, Ur: NEGATIVE

## 2020-07-27 LAB — LIPASE, BLOOD: Lipase: 29 U/L (ref 11–51)

## 2020-07-27 MED ORDER — ONDANSETRON 4 MG PO TBDP: 4.0000 mg | ORAL_TABLET | Freq: Three times a day (TID) | ORAL | 0 refills | Status: DC | PRN

## 2020-07-27 MED ORDER — MORPHINE SULFATE (PF) 4 MG/ML IV SOLN
4.0000 mg | Freq: Once | INTRAVENOUS | Status: AC
Start: 2020-07-27 — End: 2020-07-27
  Administered 2020-07-27: 4 mg via INTRAVENOUS
  Filled 2020-07-27: qty 1

## 2020-07-27 MED ORDER — ONDANSETRON HCL 4 MG/2ML IJ SOLN
4.0000 mg | Freq: Once | INTRAMUSCULAR | Status: AC
  Administered 2020-07-27: 4 mg via INTRAVENOUS
  Filled 2020-07-27: qty 2

## 2020-07-27 MED ORDER — OXYCODONE-ACETAMINOPHEN 5-325 MG PO TABS
1.0000 | ORAL_TABLET | ORAL | Status: DC | PRN
  Administered 2020-07-27: 1 via ORAL
  Filled 2020-07-27: qty 1

## 2020-07-27 MED ORDER — ONDANSETRON 4 MG PO TBDP
4.0000 mg | ORAL_TABLET | Freq: Once | ORAL | Status: AC | PRN
  Administered 2020-07-27: 4 mg via ORAL
  Filled 2020-07-27: qty 1

## 2020-07-27 MED ORDER — LACTATED RINGERS IV BOLUS
1000.0000 mL | Freq: Once | INTRAVENOUS | Status: AC
  Administered 2020-07-27: 1000 mL via INTRAVENOUS

## 2020-07-27 MED ORDER — SULFAMETHOXAZOLE-TRIMETHOPRIM 800-160 MG PO TABS: 1.0000 | ORAL_TABLET | Freq: Two times a day (BID) | ORAL | 0 refills | Status: AC

## 2020-07-27 MED ORDER — OXYCODONE-ACETAMINOPHEN 5-325 MG PO TABS: 1.0000 | ORAL_TABLET | ORAL | 0 refills | Status: DC | PRN

## 2020-07-27 MED ORDER — KETOROLAC TROMETHAMINE 30 MG/ML IJ SOLN
15.0000 mg | Freq: Once | INTRAMUSCULAR | Status: AC
  Administered 2020-07-27: 15 mg via INTRAVENOUS
  Filled 2020-07-27: qty 1

## 2020-07-27 NOTE — ED Notes (Signed)
Pt states that she has pain in her left lower back that started today. Pt states it's been difficult to pee and that she has had nausea and vomiting. Pt states pain is 10/10.

## 2020-07-27 NOTE — ED Provider Notes (Signed)
[facility] Emergency Department Provider Note   ____________________________________________   Event Date/Time   First MD Initiated Contact with Patient 07/27/20 0354     (approximate)  I have reviewed the triage vital signs and the nursing notes.   HISTORY  Chief Complaint Back Pain    HPI Madison Mack is a 24 y.o. female removed possible history of cyclic vomiting syndrome who presents to the ED complaining of flank pain.  Patient reports that she had acute onset of pain affecting her left flank and lower back around 10 PM yesterday evening.  Pain has been constant since then but waxing and waning in severity.  She describes it as sharp, not exacerbated or alleviated by anything.  It is been associated with nausea and multiple episodes of vomiting, but she denies any diarrhea.  She has not had any fevers and denies any dysuria or hematuria.  She reports similar symptoms when she was pregnant.  They thought she might have a kidney stone at that time but she did not require any intervention and symptoms resolved after she delivered.        Past Medical History:  Diagnosis Date  . Allergy   . Anxiety   . HA (headache)   . Nausea & vomiting     Patient Active Problem List   Diagnosis Date Noted  . Labor and delivery, indication for care 04/12/2020  . Normal labor and delivery 04/12/2020  . Rh negative state in antepartum period 09/06/2019  . Supervision of high risk pregnancy, antepartum 08/29/2019  . Obesity affecting pregnancy in first trimester 08/29/2019  . BMI 37.0-37.9, adult 08/29/2019  . Intractable vomiting   . Barrett's esophagus without dysplasia   . MDD (major depressive disorder), recurrent episode, mild (HCC) 09/08/2018  . Cyclic vomiting syndrome 09/08/2018  . ADD (attention deficit disorder) 09/08/2018    Past Surgical History:  Procedure Laterality Date  . ANKLE ARTHODESIS W/ ARTHROSCOPY Right   . ANKLE FRACTURE  SURGERY Right 04/08  . ESOPHAGOGASTRODUODENOSCOPY (EGD) WITH PROPOFOL N/A 08/10/2019   Procedure: ESOPHAGOGASTRODUODENOSCOPY (EGD) WITH PROPOFOL;  Surgeon: Pasty Spillers, MD;  Location: [facility] ENDOSCOPY;  Service: Endoscopy;  Laterality: N/A;    Prior to Admission medications   Medication Sig Start Date End Date Taking? Authorizing Provider  hydrOXYzine (ATARAX/VISTARIL) 25 MG tablet Take 1 tablet (25 mg total) by mouth 3 (three) times daily as needed for anxiety. 03/16/20   Tresea Mall, CNM  metoCLOPramide (REGLAN) 10 MG tablet Take 10 mg by mouth 4 (four) times daily.     [provider]  naproxen (NAPROSYN) 500 MG tablet Take 1 tablet (500 mg total) by mouth 2 (two) times daily with a meal. For 1-2 weeks then as needed. 06/26/20   Karamalegos, Netta Neat, DO  omeprazole (PRILOSEC) 20 MG capsule Take 20 mg by mouth daily.     [provider]  ondansetron (ZOFRAN ODT) 4 MG disintegrating tablet Take 1 tablet (4 mg total) by mouth every 8 (eight) hours as needed for nausea or vomiting. 07/27/20   Chesley Noon, MD  oxyCODONE-acetaminophen (PERCOCET) 5-325 MG tablet Take 1 tablet by mouth every 4 (four) hours as needed for severe pain. 07/27/20 07/27/21  Chesley Noon, MD  Prenatal Vit-Fe Fumarate-FA (MULTIVITAMIN-PRENATAL) 27-0.8 MG TABS tablet Take 1 tablet by mouth daily at 12 noon.     [provider]  sertraline (ZOLOFT) 50 MG tablet Take 1.5 tablets (75 mg total) by mouth daily. 07/11/20   Velora Mediate  P, MD  sulfamethoxazole-trimethoprim (BACTRIM DS) 800-160 MG tablet Take 1 tablet by mouth 2 (two) times daily for 7 days. 07/27/20 08/03/20  Chesley Noon, MD    Allergies Penicillins, Amoxicillin, and Doxycycline  Family History  Problem Relation Age of Onset  . Hyperlipidemia Father   . Hypertension Mother   . ADD / ADHD Maternal Aunt   . Alcohol abuse Maternal Aunt   . Drug abuse Maternal Aunt   . Anxiety disorder Maternal Aunt   . Alcohol  abuse Maternal Uncle   . Drug abuse Maternal Uncle   . Anxiety disorder Paternal Grandfather   . Depression Paternal Grandfather   . Dementia Paternal Grandmother     Social History Social History   Tobacco Use  . Smoking status: Never Smoker  . Smokeless tobacco: Never Used  Vaping Use  . Vaping Use: Never used  Substance Use Topics  . Alcohol use: No    Alcohol/week: 0.0 standard drinks  . Drug use: No    Review of Systems  Constitutional: No fever/chills Eyes: No visual changes. ENT: No sore throat. Cardiovascular: Denies chest pain. Respiratory: Denies shortness of breath. Gastrointestinal: No abdominal pain.  Positive for flank pain, nausea, and vomiting.  No diarrhea.  No constipation. Genitourinary: Negative for dysuria. Musculoskeletal: Negative for back pain. Skin: Negative for rash. Neurological: Negative for headaches, focal weakness or numbness.  ____________________________________________   PHYSICAL EXAM:  VITAL SIGNS: ED Triage Vitals  Enc Vitals Group     BP 07/27/20 0046 (!) 131/105     Pulse Rate 07/27/20 0046 (!) 109     Resp 07/27/20 0046 20     Temp 07/27/20 0046 98.3 F (36.8 C)     Temp Source 07/27/20 0046 Oral     SpO2 07/27/20 0046 97 %     Weight 07/27/20 0046 250 lb (113.4 kg)     Height 07/27/20 0046 5\' 8"  (1.727 m)     Head Circumference --      Peak Flow --      Pain Score 07/27/20 0052 10     Pain Loc --      Pain Edu? --      Excl. in GC? --     Constitutional: Alert and oriented.  Uncomfortable appearing. Eyes: Conjunctivae are normal. Head: Atraumatic. Nose: No congestion/rhinnorhea. Mouth/Throat: Mucous membranes are moist. Neck: Normal ROM Cardiovascular: Normal rate, regular rhythm. Grossly normal heart sounds. Respiratory: Normal respiratory effort.  No retractions. Lungs CTAB. Gastrointestinal: Soft and nontender.  CVA tenderness noted on left.  No distention. Genitourinary: deferred Musculoskeletal: No lower  extremity tenderness nor edema. Neurologic:  Normal speech and language. No gross focal neurologic deficits are appreciated. Skin:  Skin is warm, dry and intact. No rash noted. Psychiatric: Mood and affect are normal. Speech and behavior are normal.  ____________________________________________   LABS (all labs ordered are listed, but only abnormal results are displayed)  Labs Reviewed  COMPREHENSIVE METABOLIC PANEL - Abnormal; Notable for the following components:      Result Value   Glucose, Bld 122 (*)    All other components within normal limits  CBC - Abnormal; Notable for the following components:   WBC 10.7 (*)    All other components within normal limits  URINALYSIS, COMPLETE (UACMP) WITH MICROSCOPIC - Abnormal; Notable for the following components:   Color, Urine AMBER (*)    APPearance CLOUDY (*)    Specific Gravity, Urine 1.040 (*)    Hgb urine dipstick SMALL (*)  Bilirubin Urine SMALL (*)    Ketones, ur 80 (*)    Protein, ur 100 (*)    Leukocytes,Ua MODERATE (*)    WBC, UA >50 (*)    Bacteria, UA RARE (*)    All other components within normal limits  URINE CULTURE  LIPASE, BLOOD  POC URINE PREG, ED    PROCEDURES  Procedure(s) performed (including Critical Care):  Procedures   ____________________________________________   INITIAL IMPRESSION / ASSESSMENT AND PLAN / ED COURSE       24 year old female with past medical history of cyclic vomiting syndrome who presents to the ED with acute onset left flank pain that has been waxing and waning in severity since then.  She is in significant pain on my assessment, clutching her left flank.  CT images reviewed and shows distal left ureteral stone with mild associated hydronephrosis.  Pregnancy testing is negative, UA pending.  Lab work thus far is unremarkable.  We will treat patient's pain with Toradol, check UA to ensure no infection.  Patient without any improvement in pain following dose of Toradol, she was  subsequently given IV morphine and is feeling much better.  UA is concerning for infection with greater than 50 WBCs, however there is a significant amount of squamous cells and sample could be contaminated.  Urine was sent for culture and case discussed with Dr. Apolinar Junes of urology, who agrees that contamination is likely but it is worth covering patient with antibiotics.  We will treat with Bactrim given her penicillin allergy.  With otherwise reassuring work-up, Dr. Apolinar Junes agrees that patient is appropriate for outpatient follow-up.  She was counseled to schedule follow-up with urology and otherwise return to the ED for new worsening symptoms, patient agrees with plan.      ____________________________________________   FINAL CLINICAL IMPRESSION(S) / ED DIAGNOSES  Final diagnoses:  Kidney stone  Flank pain     ED Discharge Orders         Ordered    sulfamethoxazole-trimethoprim (BACTRIM DS) 800-160 MG tablet  2 times daily        07/27/20 0650    ondansetron (ZOFRAN ODT) 4 MG disintegrating tablet  Every 8 hours PRN        07/27/20 0650    oxyCODONE-acetaminophen (PERCOCET) 5-325 MG tablet  Every 4 hours PRN        07/27/20 0650           Note:  This document was prepared using Dragon voice recognition software and may include unintentional dictation errors.   Chesley Noon, MD 07/27/20 [phone]

## 2020-07-27 NOTE — ED Triage Notes (Signed)
PT to ED via POV for Left lower back pain. States it came on suddenly. States the pain made her pass out and vomit. Pain goes into her L leg.  States this happened once before when she was pregnant with her son. They thought it was a kidney stone but pain resolved after giving birth and she never passed a stone.

## 2020-07-28 LAB — URINE CULTURE

## 2020-08-24 ENCOUNTER — Ambulatory Visit: Payer: Self-pay

## 2020-08-24 ENCOUNTER — Other Ambulatory Visit: Payer: Self-pay

## 2020-08-24 ENCOUNTER — Encounter: Payer: Self-pay | Admitting: Family Medicine

## 2020-08-24 ENCOUNTER — Telehealth (INDEPENDENT_AMBULATORY_CARE_PROVIDER_SITE_OTHER): Payer: BC Managed Care – PPO | Admitting: Family Medicine

## 2020-08-24 DIAGNOSIS — B349 Viral infection, unspecified: Secondary | ICD-10-CM | POA: Insufficient documentation

## 2020-08-24 NOTE — Telephone Encounter (Signed)
  Patient called stating that she has been exposed to a person recently Dx with COVID-19  She states that she went to work yesterday feeling good but by the end of the day she had chills , sweats body aches, dry cough and chest pressure. No fever She denies SOB.  She is now in line for a COVID-19 test. She states that she was up last night with nausea and vomiting. She states she is able to keep fluids down. No vomiting today. She has severe headache last night. She has treated with Excedrin body aches and headache. Per protocol patient will test for COVID-19. Call placed to office.  Will route note for evaluation Reason for Disposition . [1] HIGH RISK patient AND [2] influenza exposure within the last 7 days AND [3] ONE OR MORE respiratory symptoms: cough, sore throat, runny or stuffy nose  Answer Assessment - Initial Assessment Questions 1. COVID-19 DIAGNOSIS: "Who made your COVID-19 diagnosis?" "Was it confirmed by a positive lab test?" If not diagnosed by a HCP, ask "Are there lots of cases (community spread) where you live?" Note: See public health department website, if unsure.    yes 2. COVID-19 EXPOSURE: "Was there any known exposure to COVID before the symptoms began?" CDC Definition of close contact: within 6 feet (2 meters) for a total of 15 minutes or more over a 24-hour period.     Sister tested positive on Tuesday 3. ONSET: "When did the COVID-19 symptoms start?"     yesterday 4. WORST SYMPTOM: "What is your worst symptom?" (e.g., cough, fever, shortness of breath, muscle aches)     Body aches, chills sweats, cough dry chest pressure 5. COUGH: "Do you have a cough?" If Yes, ask: "How bad is the cough?"      Dry cough 6. FEVER: "Do you have a fever?" If Yes, ask: "What is your temperature, how was it measured, and when did it start?"    unsure 7. RESPIRATORY STATUS: "Describe your breathing?" (e.g., shortness of breath, wheezing, unable to speak)     Not all the time 8.  BETTER-SAME-WORSE: "Are you getting better, staying the same or getting worse compared to yesterday?"  If getting worse, ask, "In what way?"    Nausea vomiting last night yes 9. HIGH RISK DISEASE: "Do you have any chronic medical problems?" (e.g., asthma, heart or lung disease, weak immune system, obesity, etc.)     no 10. VACCINE: "Have you gotten the COVID-19 vaccine?" If Yes ask: "Which one, how many shots, when did you get it?"     No 11. PREGNANCY: "Is there any chance you are pregnant?" "When was your last menstrual period?"      No just delivered 4 months ago Christmas last menses 12. OTHER SYMPTOMS: "Do you have any other symptoms?"  (e.g., chills, fatigue, headache, loss of smell or taste, muscle pain, sore throat; new loss of smell or taste especially support the diagnosis of COVID-19)       Fatigue,headache, muscle pain,  Protocols used: CORONAVIRUS (COVID-19) DIAGNOSED OR SUSPECTED-A-AH

## 2020-08-24 NOTE — Assessment & Plan Note (Signed)
Likely COVID infection based on exposure days prior to symptom onset.  COVID swab taken today.  Encouraged OTC medication usage for symptoms.  Strict ER precautions discussed.  RTC PRN

## 2020-08-24 NOTE — Progress Notes (Signed)
Virtual Visit via Telephone  The purpose of this virtual visit is to provide medical care while limiting exposure to the novel coronavirus (COVID19) for both patient and office staff.  Consent was obtained for phone visit:  Yes.   Answered questions that patient had about telehealth interaction:  Yes.   I discussed the limitations, risks, security and privacy concerns of performing an evaluation and management service by telephone. I also discussed with the patient that there may be a patient responsible charge related to this service. The patient expressed understanding and agreed to proceed.  Patient is at home and is accessed via telephone Services are provided by Charlaine Dalton, FNP-C from [facility])  ---------------------------------------------------------------------- Chief Complaint  Patient presents with  . Generalized Body Aches    Persistent Headache, fatigue, chills, sweats, nauseated, diarrhea, weakness and  vomiting x 1 day. Pt was exposed to COVID and currently had a test done today.      S: Reviewed CMA documentation. I have called patient and gathered additional HPI as follows:  Madison Mack presents for virtual telemedicine visit via telephone for COVID exposure with symptoms of persistent headache, fatigue, chills, sweats, nausea, vomiting, diarrhea, and weakness x 1 day.  Reports was exposed to a family member on Sunday for several hours, unmasked.  Has not been vaccinated by COVID.  Denies sore throat, fever, change in taste/smell, SOB, DOE, CP.    Patient is currently home Denies any high risk travel to areas of current concern for COVID19. Denies any known or suspected exposure to person with or possibly with COVID19.  Past Medical History:  Diagnosis Date  . Allergy   . Anxiety   . HA (headache)   . Nausea & vomiting    Social History   Tobacco Use  . Smoking status: Never Smoker  . Smokeless tobacco: Never Used  Vaping Use  .  Vaping Use: Never used  Substance Use Topics  . Alcohol use: No    Alcohol/week: 0.0 standard drinks  . Drug use: No    Current Outpatient Medications:  .  sertraline (ZOLOFT) 50 MG tablet, Take 1.5 tablets (75 mg total) by mouth daily., Disp: 45 tablet, Rfl: 6 .  hydrOXYzine (ATARAX/VISTARIL) 25 MG tablet, Take 1 tablet (25 mg total) by mouth 3 (three) times daily as needed for anxiety. (Patient not taking: Reported on 08/24/2020), Disp: 30 tablet, Rfl: 1 .  metoCLOPramide (REGLAN) 10 MG tablet, Take 10 mg by mouth 4 (four) times daily.  (Patient not taking: Reported on 08/24/2020), Disp: , Rfl:  .  naproxen (NAPROSYN) 500 MG tablet, Take 1 tablet (500 mg total) by mouth 2 (two) times daily with a meal. For 1-2 weeks then as needed. (Patient not taking: Reported on 08/24/2020), Disp: 60 tablet, Rfl: 0 .  omeprazole (PRILOSEC) 20 MG capsule, Take 20 mg by mouth daily.  (Patient not taking: Reported on 08/24/2020), Disp: , Rfl:  .  ondansetron (ZOFRAN ODT) 4 MG disintegrating tablet, Take 1 tablet (4 mg total) by mouth every 8 (eight) hours as needed for nausea or vomiting. (Patient not taking: Reported on 08/24/2020), Disp: 12 tablet, Rfl: 0 .  oxyCODONE-acetaminophen (PERCOCET) 5-325 MG tablet, Take 1 tablet by mouth every 4 (four) hours as needed for severe pain. (Patient not taking: Reported on 08/24/2020), Disp: 12 tablet, Rfl: 0 .  Prenatal Vit-Fe Fumarate-FA (MULTIVITAMIN-PRENATAL) 27-0.8 MG TABS tablet, Take 1 tablet by mouth daily at 12 noon.  (Patient not taking: Reported on 08/24/2020),  Disp: , Rfl:   Depression screen [facility] 2/9 04/20/2020 04/19/2019 03/17/2019  Decreased Interest 0 0 0  Down, Depressed, Hopeless 0 1 0  PHQ - 2 Score 0 1 0  Altered sleeping 0 0 -  Tired, decreased energy 0 3 -  Change in appetite 0 0 -  Feeling bad or failure about yourself  0 0 -  Trouble concentrating 0 1 -  Moving slowly or fidgety/restless 0 0 -  Suicidal thoughts 0 0 -  PHQ-9 Score 0 5 -  Difficult  doing work/chores - Not difficult at all -    GAD 7 : Generalized Anxiety Score 04/20/2020 09/28/2018 09/08/2018  Nervous, Anxious, on Edge 1 1 2   Control/stop worrying 0 0 1  Worry too much - different things 0 1 1  Trouble relaxing 0 0 1  Restless 0 0 0  Easily annoyed or irritable 1 1 2   Afraid - awful might happen 0 0 0  Total GAD 7 Score 2 3 7   Anxiety Difficulty - Not difficult at all Not difficult at all    -------------------------------------------------------------------------- O: No physical exam performed due to remote telephone encounter.  Physical Exam: Patient remotely monitored without video.  Verbal communication appropriate.  Cognition normal.  Recent Results (from the past 2160 hour(s))  Lipase, blood     Status: None   Collection Time: 07/27/20  1:05 AM  Result Value Ref Range   Lipase 29 11 - 51 U/L    Comment: Performed at [facility], [address] [place], [facility] [address]  Comprehensive metabolic panel     Status: Abnormal   Collection Time: 07/27/20  1:05 AM  Result Value Ref Range   Sodium 138 135 - 145 mmol/L   Potassium 3.5 3.5 - 5.1 mmol/L   Chloride 106 98 - 111 mmol/L   CO2 22 22 - 32 mmol/L   Glucose, Bld 122 (H) 70 - 99 mg/dL    Comment: Glucose reference range applies only to samples taken after fasting for at least 8 hours.   BUN 10 6 - 20 mg/dL   Creatinine, Ser [place] 0.44 - 1.00 mg/dL   Calcium 9.1 8.9 - 12248 mg/dL   Total Protein 7.7 6.5 - 8.1 g/dL   Albumin 4.3 3.5 - 5.0 g/dL   AST 19 15 - 41 U/L   ALT 19 0 - 44 U/L   Alkaline Phosphatase 66 38 - 126 U/L   Total Bilirubin 0.9 0.3 - 1.2 mg/dL   GFR, Estimated 07/29/20 2.50 mL/min    Comment: (NOTE) Calculated using the CKD-EPI Creatinine Equation (2021)    Anion gap 10 5 - 15    Comment: Performed at [facility], [address] [place], [facility] [address]  CBC     Status: Abnormal   Collection Time: 07/27/20  1:05 AM  Result Value Ref Range   WBC 10.7 (H) 4.0  - 10.5 K/uL   RBC 4.85 3.87 - 5.11 MIL/uL   Hemoglobin 13.9 12.0 - 15.0 g/dL   HCT [place] [address] - 07/29/20 %   MCV 84.7 80.0 - 100.0 fL   MCH 28.7 26.0 - 34.0 pg   MCHC 33.8 30.0 - 36.0 g/dL   RDW 45.0 38.8 - 82.8 %   Platelets 266 150 - 400 K/uL   nRBC 0.0 0.0 - 0.2 %    Comment: Performed at [facility], [address], [place], [facility] [address]  POC urine preg, ED  Status: None   Collection Time: 07/27/20  1:16 AM  Result Value Ref Range   Preg Test, Ur NEGATIVE NEGATIVE    Comment:        THE SENSITIVITY OF THIS METHODOLOGY IS >24 mIU/mL   Urinalysis, Complete w Microscopic     Status: Abnormal   Collection Time: 07/27/20  5:18 AM  Result Value Ref Range   Color, Urine AMBER (A) YELLOW    Comment: BIOCHEMICALS MAY BE AFFECTED BY COLOR   APPearance CLOUDY (A) CLEAR   Specific Gravity, Urine 1.040 (H) 1.005 - 1.030   pH 5.0 5.0 - 8.0   Glucose, UA NEGATIVE NEGATIVE mg/dL   Hgb urine dipstick SMALL (A) NEGATIVE   Bilirubin Urine SMALL (A) NEGATIVE   Ketones, ur 80 (A) NEGATIVE mg/dL   Protein, ur 387 (A) NEGATIVE mg/dL   Nitrite NEGATIVE NEGATIVE   Leukocytes,Ua MODERATE (A) NEGATIVE   RBC / HPF 11-20 0 - 5 RBC/hpf   WBC, UA >50 (H) 0 - 5 WBC/hpf   Bacteria, UA RARE (A) NONE SEEN   Squamous Epithelial / LPF 11-20 0 - 5   Mucus PRESENT     Comment: Performed at [facility], [address] [place], [place] [address]  Urine culture     Status: Abnormal   Collection Time: 07/27/20  5:18 AM   Specimen: Urine, Random  Result Value Ref Range   Specimen Description      URINE, RANDOM Performed at [facility], [address] [place], [place] [address]    Special Requests      NONE Performed at [facility], [address] [place], [place] [address]    Culture MULTIPLE SPECIES PRESENT, SUGGEST RECOLLECTION (A)    Report Status 07/28/2020 FINAL      -------------------------------------------------------------------------- A&P:  Problem List Items Addressed This Visit      Other   Viral infection - Primary    Likely COVID infection based on exposure days prior to symptom onset.  COVID swab taken today.  Encouraged OTC medication usage for symptoms.  Strict ER precautions discussed.  RTC PRN         No orders of the defined types were placed in this encounter.   Follow-up: - Return if symptoms worsen or fail to improve  Patient verbalizes understanding with the above medical recommendations including the limitation of remote medical advice.  Specific follow-up and call-back criteria were given for patient to follow-up or seek medical care more urgently if needed.  - Time spent in direct consultation with patient on phone: 8 minutes  Charlaine Dalton, FNP-C [facility] 08/24/2020, 1:57 PM

## 2020-10-17 ENCOUNTER — Encounter: Payer: Self-pay | Admitting: Family Medicine

## 2020-10-17 ENCOUNTER — Other Ambulatory Visit: Payer: Self-pay

## 2020-10-17 ENCOUNTER — Ambulatory Visit (INDEPENDENT_AMBULATORY_CARE_PROVIDER_SITE_OTHER): Payer: BC Managed Care – PPO | Admitting: Family Medicine

## 2020-10-17 VITALS — BP 130/59 | HR 75 | Temp 97.1°F | Resp 18 | Ht 68.0 in | Wt 240.2 lb

## 2020-10-17 DIAGNOSIS — G5602 Carpal tunnel syndrome, left upper limb: Secondary | ICD-10-CM | POA: Diagnosis not present

## 2020-10-17 MED ORDER — PREDNISONE 20 MG PO TABS: ORAL_TABLET | ORAL | 0 refills | Status: DC

## 2020-10-17 NOTE — Patient Instructions (Addendum)
Thank you for coming to the office today.  You most likely have Carpal Tunnel Syndrome of Left hand/wrist based on your symptoms. This a problem of compression on the nerve entering the hand at the wrist. Often it is caused by long history of overuse or repetitive activities that put strain on the nerve within wrist. Occasionally this can be caused by swelling or weight gain and pressure on this nerve as well.  Prednisone steroid taper for 7 days, no ibuprofen advil aleve motrin on this med  Try topical OTC Voltaren (Diclofenac) cream/gel up to 4 times daily as anti inflammatory.  - It is safe to take Tylenol Ext Str 500mg  tabs - take 1 to 2 (max dose 1000mg ) every 6 hours as needed for breakthrough pain, max 24 hour daily dose is 6 to 8 tablets or 4000mg   Referral to Orthopedic  [facility] ([facility] Address: [address] [place], [place], [address] [place] Hours:  9AM-5PM Phone: [phone]   Please schedule a Follow-up Appointment to: Return if symptoms worsen or fail to improve, for carpal tunnel.  If you have any other questions or concerns, please feel free to call the office or send a message through MyChart. You may also schedule an earlier appointment if necessary.  Additionally, you may be receiving a survey about your experience at our office within a few days to 1 week by e-mail or mail. We value your feedback.  [place], DO [facility], [phone]

## 2020-10-17 NOTE — Progress Notes (Signed)
Subjective:    Patient ID: Madison Mack, female    DOB: [birth year], 25 y.o.   MRN: [id number]  Madison Mack is a 25 y.o. female presenting on 10/17/2020 for Arm Pain (Left arm pain x 3 weeks. She state it started off as a numbness feeling in the hand. Now the numbness and tingling feeling radiates in the wrist and up the arm. She complains of a constant squeezing, aching pain that intensify at times. She treated the symptoms with a wrist brace, naproxen, and  heating pad with no improvement.)   HPI   Left Hand, Carpal Tunnel Syndrome Last visit for same issue 06/2020, see prior note for background - briefly - Reports symptoms onset after recent pregnancy, she said symptoms onset with paresthesia worse with sleep and often triggered with numbness and shooting pain into forearm. It affects her grip and has some weaker grip strength. 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 - Treated with NSAID naproxen, wrist splint, activity modification - she had some improvement initially when using all treatment then symptoms returned now progressively worse in past >3 months  She sleeps by laying directly on L arm and it does help, putting pressure on it helps.  Now worsening  Describes constant pain. 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 Describes paresthesia numbness in Left hand only thumb, index, and middle fingers only - inconsistent distribution with paresthesia and numbness in left hand she has pain with difficulty moving   Depression screen [facility] 2/9 04/20/2020 04/19/2019 03/17/2019  Decreased Interest 0 0 0  Down, Depressed, Hopeless 0 1 0  PHQ - 2 Score 0 1 0  Altered sleeping 0 0 -  Tired, decreased energy 0 3 -  Change in appetite 0 0 -  Feeling bad or failure about yourself  0 0 -  Trouble concentrating 0 1 -  Moving slowly or fidgety/restless 0 0 -  Suicidal thoughts 0 0 -  PHQ-9 Score 0 5 -   Difficult doing work/chores - Not difficult at all -    Social History   Tobacco Use  . Smoking status: Never Smoker  . Smokeless tobacco: Never Used  Vaping Use  . Vaping Use: Never used  Substance Use Topics  . Alcohol use: No    Alcohol/week: 0.0 standard drinks  . Drug use: No    Review of Systems Per HPI unless specifically indicated above     Objective:    BP (!) 130/59 (BP Location: Left Arm, Patient Position: Sitting, Cuff Size: Large)   Pulse 75   Temp (!) 97.1 F (36.2 C) (Temporal)   Resp 18   Ht 5\' 8"  (1.727 m)   Wt 240 lb 3.2 oz (109 kg)   SpO2 100%   BMI 36.52 kg/m   Wt Readings from Last 3 Encounters:  10/17/20 240 lb 3.2 oz (109 kg)  07/27/20 250 lb (113.4 kg)  07/11/20 247 lb (112 kg)    Physical Exam Vitals and nursing note reviewed.  Constitutional:      General: She is not in acute distress.    Appearance: She is well-developed and well-nourished. She is not diaphoretic.     Comments: Well-appearing, comfortable, cooperative  HENT:     Head: Normocephalic and atraumatic.     Mouth/Throat:     Mouth: Oropharynx is clear and moist.  Eyes:     General:  Right eye: No discharge.        Left eye: No discharge.     Conjunctiva/sclera: Conjunctivae normal.  Cardiovascular:     Rate and Rhythm: Normal rate.  Pulmonary:     Effort: Pulmonary effort is normal.  Musculoskeletal:        General: No edema.     Comments: Left Hand/Wrist Inspection: Normal appearance, symmetrical, no bulky MCP joints, no edema or erythema. Palpation: Non tender hand / wrist, carpal bones, including MCP, base of thumb. No distinct anatomical snuff box or scaphoid tenderness.  ROM: full active wrist ROM flex / ext, ulnar / radial deviation, without pain with radial deviation Special Testing:  POSITIVE Tinel's median nerve for paresthesia.  Strength: 5/5 grip, thumb opposition, wrist flex/ext Neurovascular: thumb index ring finger reduced sensation during exam   Skin:    General: Skin is warm and dry.     Findings: No erythema or rash.  Neurological:     Mental Status: She is alert and oriented to person, place, and time.  Psychiatric:        Mood and Affect: Mood and affect normal.        Behavior: Behavior normal.     Comments: Well groomed, good eye contact, normal speech and thoughts    Results for orders placed or performed during the hospital encounter of 07/27/20  Urine culture   Specimen: Urine, Random  Result Value Ref Range   Specimen Description      URINE, RANDOM Performed at [facility], [address] [place], [place] [address]    Special Requests      NONE Performed at [facility], [address] [place], [place] [address]    Culture MULTIPLE SPECIES PRESENT, SUGGEST RECOLLECTION (A)    Report Status 07/28/2020 FINAL   Lipase, blood  Result Value Ref Range   Lipase 29 11 - 51 U/L  Comprehensive metabolic panel  Result Value Ref Range   Sodium 138 135 - 145 mmol/L   Potassium 3.5 3.5 - 5.1 mmol/L   Chloride 106 98 - 111 mmol/L   CO2 22 22 - 32 mmol/L   Glucose, Bld 122 (H) 70 - 99 mg/dL   BUN 10 6 - 20 mg/dL   Creatinine, Ser 7.79 0.44 - 1.00 mg/dL   Calcium 9.1 8.9 - 39.0 mg/dL   Total Protein 7.7 6.5 - 8.1 g/dL   Albumin 4.3 3.5 - 5.0 g/dL   AST 19 15 - 41 U/L   ALT 19 0 - 44 U/L   Alkaline Phosphatase 66 38 - 126 U/L   Total Bilirubin 0.9 0.3 - 1.2 mg/dL   GFR, Estimated >30 >09 mL/min   Anion gap 10 5 - 15  CBC  Result Value Ref Range   WBC 10.7 (H) 4.0 - 10.5 K/uL   RBC 4.85 3.87 - 5.11 MIL/uL   Hemoglobin 13.9 12.0 - 15.0 g/dL   HCT 23.3 00.7 - 62.2 %   MCV 84.7 80.0 - 100.0 fL   MCH 28.7 26.0 - 34.0 pg   MCHC 33.8 30.0 - 36.0 g/dL   RDW 63.3 35.4 - 56.2 %   Platelets 266 150 - 400 K/uL   nRBC 0.0 0.0 - 0.2 %  Urinalysis, Complete w Microscopic  Result Value Ref Range   Color, Urine AMBER (A) YELLOW   APPearance CLOUDY (A) CLEAR   Specific Gravity, Urine 1.040 (H)  1.005 - 1.030   pH 5.0 5.0 - 8.0  Glucose, UA NEGATIVE NEGATIVE mg/dL   Hgb urine dipstick SMALL (A) NEGATIVE   Bilirubin Urine SMALL (A) NEGATIVE   Ketones, ur 80 (A) NEGATIVE mg/dL   Protein, ur 308 (A) NEGATIVE mg/dL   Nitrite NEGATIVE NEGATIVE   Leukocytes,Ua MODERATE (A) NEGATIVE   RBC / HPF 11-20 0 - 5 RBC/hpf   WBC, UA >50 (H) 0 - 5 WBC/hpf   Bacteria, UA RARE (A) NONE SEEN   Squamous Epithelial / LPF 11-20 0 - 5   Mucus PRESENT   POC urine preg, ED  Result Value Ref Range   Preg Test, Ur NEGATIVE NEGATIVE      Assessment & Plan:   Problem List Items Addressed This Visit   None   Visit Diagnoses    Left carpal tunnel syndrome    -  Primary   Relevant Medications   predniSONE (DELTASONE) 20 MG tablet   Other Relevant Orders   Ambulatory referral to Orthopedic Surgery      Clinically most consistent with subacute on chronic worsening L hand carpal tunnel syndrome Classic location of symptoms affecting median nerve now >4-5 months after pregnancy as risk factor Temporary relief on conservative therapy but progression of problem now impacting her function at work  Plan: 1. Discussed course of carpal tunnel syndrome, treatment, prognosis, complications 2. Start Prednisone taper over 7 days 3. Tylenol PRN 4. Topical Diclofenac NSAID PRN 5. Use wrist splint PRN 6. REFERRAL to [facility] for Hand/Wrist specialty Dr Berta Minor  Meds ordered this encounter  Medications  . predniSONE (DELTASONE) 20 MG tablet    Sig: Take daily with food. Start with 60mg  (3 pills) x 2 days, then reduce to 40mg  (2 pills) x 2 days, then 20mg  (1 pill) x 3 days    Dispense:  13 tablet    Refill:  0   Orders Placed This Encounter  Procedures  . Ambulatory referral to Orthopedic Surgery    Referral Priority:   Routine    Referral Type:   Surgical    Referral Reason:   Specialty Services Required    Requested Specialty:   Orthopedic Surgery    Number of Visits Requested:    1    Follow up plan: Return if symptoms worsen or fail to improve, for carpal tunnel.   , DO [facility] 10/17/2020, 3:04 PM

## 2021-03-17 ENCOUNTER — Other Ambulatory Visit: Payer: Self-pay | Admitting: Obstetrics & Gynecology

## 2021-03-18 ENCOUNTER — Other Ambulatory Visit: Payer: Self-pay

## 2021-03-18 ENCOUNTER — Ambulatory Visit (INDEPENDENT_AMBULATORY_CARE_PROVIDER_SITE_OTHER): Payer: BC Managed Care – PPO | Admitting: Family Medicine

## 2021-03-18 ENCOUNTER — Encounter: Payer: Self-pay | Admitting: Family Medicine

## 2021-03-18 VITALS — BP 119/63 | HR 77 | Ht 68.0 in | Wt 242.6 lb

## 2021-03-18 DIAGNOSIS — Z021 Encounter for pre-employment examination: Secondary | ICD-10-CM

## 2021-03-18 NOTE — Progress Notes (Signed)
Subjective:    Patient ID: Jalayah Gutridge, female    DOB: [birth year], 25 y.o.   MRN: [id number]  Alitza Cowman is a 26 y.o. female presenting on 03/18/2021 for Annual Exam   HPI  Here for Employment Physical / Evaluation  Has form today to be completed.  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  She has no symptoms or concerns of any high risk exposure or history for communicable diseases, see below for screening on TB  She has no visual or hearing impairment that would impact her job function. She may wear reading glasses on occasion.  She has no physical restrictions from performing her job with regards to lifting / carrying.   Tuberculosis Risk Questionnaire 1) Were you born outside the [place] in one of the following parts of the world: [place], [place], [place], or [place]? NO  2) Have you traveled outside the [place] and lived for more than one month in one of the following parts of the world: [place], [place], [place], [place], or [place]? NO  3) Do you have a compromised immune system such as from any of the following conditions: HIV/AIDS, organ or bone marrow transplantation, diabetes, immunosuppressive medicines (e.g. prednisone, Remicade), leukemia, lymphoma, cancer of the head or neck, gastrectomy or jejeunal bypass, end-stage renal disease (on dialysis), or silicosis? NO  4) Have you ever done one of the following: used crack cocaine, injected illegal drugs, worked or resided in jail or prison, worked or resided at a homeless shelter, or worked as a Research scientist (physical sciences) in direct contact with patients? NO  5) Have you ever been exposed to anyone with infectious tuberculosis? NO  Tuberculosis Symptom Questionnaire Do you currently have any of the following symptoms?  1) Unexplained cough lasting more than 3 weeks? NO 2) Unexplained fever lasting more than 3 weeks? NO 3) Night sweats (sweating that leaves the bedclothes  and sheets wet)? NO 4) Shortness of breath? NO 5) Chest pain? NO 6) Unintentional weight loss? NO 7) Unexplained fatigue (very tired for no reason)? NO   Depression screen [facility] 2/9 03/18/2021 04/20/2020 04/19/2019  Decreased Interest 0 0 0  Down, Depressed, Hopeless 1 0 1  PHQ - 2 Score 1 0 1  Altered sleeping 0 0 0  Tired, decreased energy 2 0 3  Change in appetite 0 0 0  Feeling bad or failure about yourself  0 0 0  Trouble concentrating 0 0 1  Moving slowly or fidgety/restless 0 0 0  Suicidal thoughts 0 0 0  PHQ-9 Score 3 0 5  Difficult doing work/chores Not difficult at all - Not difficult at all    Past Medical History:  Diagnosis Date   Allergy    Anxiety    HA (headache)    Nausea & vomiting    Past Surgical History:  Procedure Laterality Date   ANKLE ARTHODESIS W/ ARTHROSCOPY Right    ANKLE FRACTURE SURGERY Right 04/08   ESOPHAGOGASTRODUODENOSCOPY (EGD) WITH PROPOFOL N/A 08/10/2019   Procedure: ESOPHAGOGASTRODUODENOSCOPY (EGD) WITH PROPOFOL;  Surgeon: Pasty Spillers, MD;  Location: [facility] ENDOSCOPY;  Service: Endoscopy;  Laterality: N/A;   Social History   Socioeconomic History   Marital status: Married    Spouse name: thomas   Number of children: 1   Years of education: Not on file   Highest education level: Bachelor's degree (e.g., BA, AB, BS)  Occupational History   Occupation: 3rd Grade Teacher    Comment: [employer]  Tobacco Use   Smoking status: Never   Smokeless tobacco: Never  Vaping Use   Vaping Use: Never used  Substance and Sexual Activity   Alcohol use: No    Alcohol/week: 0.0 standard drinks   Drug use: No   Sexual activity: Yes    Birth control/protection: I.U.D.  Other Topics Concern   Not on file  Social History Narrative   Not on file   Social Determinants of Health   Financial Resource Strain: Not on file  Food Insecurity: Not on file  Transportation Needs: Not on file  Physical Activity: Not on file   Stress: Not on file  Social Connections: Not on file  Intimate Partner Violence: Not on file   Family History  Problem Relation Age of Onset   Hyperlipidemia Father    Hypertension Mother    ADD / ADHD Maternal Aunt    Alcohol abuse Maternal Aunt    Drug abuse Maternal Aunt    Anxiety disorder Maternal Aunt    Alcohol abuse Maternal Uncle    Drug abuse Maternal Uncle    Anxiety disorder Paternal Grandfather    Depression Paternal Grandfather    Dementia Paternal Grandmother    Current Outpatient Medications on File Prior to Visit  Medication Sig   sertraline (ZOLOFT) 50 MG tablet TAKE 1 AND 1/2 TABLET BY MOUTH DAILY   No current facility-administered medications on file prior to visit.    Review of Systems  Constitutional:  Negative for activity change, appetite change, chills, diaphoresis, fatigue and fever.  HENT:  Negative for congestion and hearing loss.   Eyes:  Negative for visual disturbance.  Respiratory:  Negative for cough, chest tightness, shortness of breath and wheezing.   Cardiovascular:  Negative for chest pain, palpitations and leg swelling.  Gastrointestinal:  Negative for abdominal pain, constipation, diarrhea, nausea and vomiting.  Genitourinary:  Negative for dysuria, frequency and hematuria.  Musculoskeletal:  Negative for arthralgias and neck pain.  Skin:  Negative for rash.  Allergic/Immunologic: Negative for environmental allergies.  Neurological:  Negative for dizziness, weakness, light-headedness, numbness and headaches.  Hematological:  Negative for adenopathy.  Psychiatric/Behavioral:  Negative for behavioral problems, dysphoric mood and sleep disturbance.   Per HPI unless specifically indicated above      Objective:    BP 119/63   Pulse 77   Ht 5\' 8"  (1.727 m)   Wt 242 lb 9.6 oz (110 kg)   SpO2 100%   BMI 36.89 kg/m   Wt Readings from Last 3 Encounters:  03/18/21 242 lb 9.6 oz (110 kg)  10/17/20 240 lb 3.2 oz (109 kg)  07/27/20 250  lb (113.4 kg)    Physical Exam Vitals and nursing note reviewed.  Constitutional:      General: She is not in acute distress.    Appearance: She is well-developed. She is obese. She is not diaphoretic.     Comments: Well-appearing, comfortable, cooperative  HENT:     Head: Normocephalic and atraumatic.  Eyes:     General:        Right eye: No discharge.        Left eye: No discharge.     Conjunctiva/sclera: Conjunctivae normal.     Pupils: Pupils are equal, round, and reactive to light.  Neck:     Thyroid: No thyromegaly.  Cardiovascular:     Rate and Rhythm: Normal rate and regular rhythm.     Pulses: Normal pulses.     Heart sounds:  Normal heart sounds. No murmur heard. Pulmonary:     Effort: Pulmonary effort is normal. No respiratory distress.     Breath sounds: Normal breath sounds. No wheezing or rales.  Abdominal:     General: Bowel sounds are normal. There is no distension.     Palpations: Abdomen is soft. There is no mass.     Tenderness: There is no abdominal tenderness.  Musculoskeletal:        General: No tenderness. Normal range of motion.     Cervical back: Normal range of motion and neck supple.     Comments: Upper / Lower Extremities: - Normal muscle tone, strength bilateral upper extremities 5/5, lower extremities 5/5  Lymphadenopathy:     Cervical: No cervical adenopathy.  Skin:    General: Skin is warm and dry.     Findings: No erythema or rash.  Neurological:     Mental Status: She is alert and oriented to person, place, and time.     Comments: Distal sensation intact to light touch all extremities  Psychiatric:        Mood and Affect: Mood normal.        Behavior: Behavior normal.        Thought Content: Thought content normal.     Comments: Well groomed, good eye contact, normal speech and thoughts   Results for orders placed or performed during the hospital encounter of 07/27/20  Urine culture   Specimen: Urine, Random  Result Value Ref Range    Specimen Description      URINE, RANDOM Performed at [facility], [address] [place], [place] [address]    Special Requests      NONE Performed at [facility], [address] [place], [place] [address]    Culture MULTIPLE SPECIES PRESENT, SUGGEST RECOLLECTION (A)    Report Status 07/28/2020 FINAL   Lipase, blood  Result Value Ref Range   Lipase 29 11 - 51 U/L  Comprehensive metabolic panel  Result Value Ref Range   Sodium 138 135 - 145 mmol/L   Potassium 3.5 3.5 - 5.1 mmol/L   Chloride 106 98 - 111 mmol/L   CO2 22 22 - 32 mmol/L   Glucose, Bld 122 (H) 70 - 99 mg/dL   BUN 10 6 - 20 mg/dL   Creatinine, Ser 0.98 0.44 - 1.00 mg/dL   Calcium 9.1 8.9 - 11.9 mg/dL   Total Protein 7.7 6.5 - 8.1 g/dL   Albumin 4.3 3.5 - 5.0 g/dL   AST 19 15 - 41 U/L   ALT 19 0 - 44 U/L   Alkaline Phosphatase 66 38 - 126 U/L   Total Bilirubin 0.9 0.3 - 1.2 mg/dL   GFR, Estimated >14 >78 mL/min   Anion gap 10 5 - 15  CBC  Result Value Ref Range   WBC 10.7 (H) 4.0 - 10.5 K/uL   RBC 4.85 3.87 - 5.11 MIL/uL   Hemoglobin 13.9 12.0 - 15.0 g/dL   HCT 29.5 62.1 - 30.8 %   MCV 84.7 80.0 - 100.0 fL   MCH 28.7 26.0 - 34.0 pg   MCHC 33.8 30.0 - 36.0 g/dL   RDW 65.7 84.6 - 96.2 %   Platelets 266 150 - 400 K/uL   nRBC 0.0 0.0 - 0.2 %  Urinalysis, Complete w Microscopic  Result Value Ref Range   Color, Urine AMBER (A) YELLOW   APPearance CLOUDY (A) CLEAR   Specific Gravity, Urine 1.040 (H) 1.005 -  1.030   pH 5.0 5.0 - 8.0   Glucose, UA NEGATIVE NEGATIVE mg/dL   Hgb urine dipstick SMALL (A) NEGATIVE   Bilirubin Urine SMALL (A) NEGATIVE   Ketones, ur 80 (A) NEGATIVE mg/dL   Protein, ur 151 (A) NEGATIVE mg/dL   Nitrite NEGATIVE NEGATIVE   Leukocytes,Ua MODERATE (A) NEGATIVE   RBC / HPF 11-20 0 - 5 RBC/hpf   WBC, UA >50 (H) 0 - 5 WBC/hpf   Bacteria, UA RARE (A) NONE SEEN   Squamous Epithelial / LPF 11-20 0 - 5   Mucus PRESENT   POC urine preg, ED  Result Value Ref Range    Preg Test, Ur NEGATIVE NEGATIVE      Assessment & Plan:   Problem List Items Addressed This Visit   None Visit Diagnoses     Physical exam, pre-employment    -  Primary       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 Exam and vitals reviewed today TB Screening completed, negative. See above HPI, no required TB Skin test unless requested by employer specifically.  No orders of the defined types were placed in this encounter.     Follow up plan: Return if symptoms worsen or fail to improve.  Saralyn Pilar, DO [facility] 03/18/2021, 3:59 PM

## 2021-03-18 NOTE — Patient Instructions (Addendum)
Thank you for coming to the office today.  You are cleared to continue working as we discussed today, form is complete.  Please schedule a Follow-up Appointment to: Return if symptoms worsen or fail to improve.  If you have any other questions or concerns, please feel free to call the office or send a message through MyChart. You may also schedule an earlier appointment if necessary.  Additionally, you may be receiving a survey about your experience at our office within a few days to 1 week by e-mail or mail. We value your feedback.  Saralyn Pilar, DO [facility] [place]

## 2021-03-18 NOTE — Telephone Encounter (Signed)
Sch Annual.  One refill til then (overdue for PAP)

## 2021-04-16 ENCOUNTER — Other Ambulatory Visit: Payer: Self-pay | Admitting: Obstetrics & Gynecology

## 2021-04-23 ENCOUNTER — Ambulatory Visit: Payer: BC Managed Care – PPO | Admitting: Obstetrics & Gynecology

## 2021-05-13 ENCOUNTER — Ambulatory Visit: Payer: Self-pay | Admitting: Obstetrics & Gynecology

## 2021-05-13 ENCOUNTER — Telehealth: Payer: Self-pay

## 2021-05-13 NOTE — Telephone Encounter (Signed)
Appointment cancelled

## 2021-05-13 NOTE — Telephone Encounter (Signed)
Pt calling to cancel her 8am appt; has been trying to get through for the last but unable to.  Please cancel appt.  She will call back to reschedule.  [phone]

## 2021-05-25 ENCOUNTER — Other Ambulatory Visit: Payer: Self-pay | Admitting: Obstetrics & Gynecology

## 2021-05-25 NOTE — Telephone Encounter (Signed)
Sch Annual before end of year Refill done

## 2021-06-14 DIAGNOSIS — Z23 Encounter for immunization: Secondary | ICD-10-CM | POA: Diagnosis not present

## 2021-07-23 ENCOUNTER — Telehealth: Payer: Self-pay | Admitting: Obstetrics & Gynecology

## 2021-07-24 NOTE — Telephone Encounter (Signed)
Voicemail is full unable to leave message

## 2021-07-24 NOTE — Telephone Encounter (Signed)
Sch Annual as is overdue

## 2021-07-25 NOTE — Telephone Encounter (Signed)
Voicemail is full unable to leave message

## 2021-09-27 ENCOUNTER — Other Ambulatory Visit: Payer: Self-pay | Admitting: Obstetrics & Gynecology

## 2021-09-30 ENCOUNTER — Other Ambulatory Visit: Payer: Self-pay | Admitting: Obstetrics & Gynecology

## 2021-10-02 ENCOUNTER — Telehealth: Payer: Self-pay

## 2021-10-02 NOTE — Telephone Encounter (Signed)
Pt calling; refill for depression medication has been denied and she doesn't know why; has been out a few days.  [phone]

## 2021-10-02 NOTE — Telephone Encounter (Signed)
This pt is wanting to have this RX switched so she can have her PCP refill it.  She doesn't think she needs to have an annual to just get her medicine refilled.  How would she go about switching the RX?  Does she just need to ask her PCP to refill it?

## 2021-10-04 ENCOUNTER — Other Ambulatory Visit: Payer: Self-pay

## 2021-10-04 ENCOUNTER — Ambulatory Visit (INDEPENDENT_AMBULATORY_CARE_PROVIDER_SITE_OTHER): Payer: BC Managed Care – PPO | Admitting: Family Medicine

## 2021-10-04 ENCOUNTER — Encounter: Payer: Self-pay | Admitting: Family Medicine

## 2021-10-04 VITALS — BP 103/56 | HR 78 | Ht 68.0 in | Wt 249.0 lb

## 2021-10-04 DIAGNOSIS — F3341 Major depressive disorder, recurrent, in partial remission: Secondary | ICD-10-CM

## 2021-10-04 DIAGNOSIS — F988 Other specified behavioral and emotional disorders with onset usually occurring in childhood and adolescence: Secondary | ICD-10-CM

## 2021-10-04 MED ORDER — SERTRALINE HCL 50 MG PO TABS: 75.0000 mg | ORAL_TABLET | Freq: Every day | ORAL | 1 refills | Status: DC

## 2021-10-04 NOTE — Progress Notes (Signed)
Subjective:    Patient ID: Madison Mack, female    DOB: 1995-12-04, 26 y.o.   MRN: [id number]  Madison Mack is a 26 y.o. female presenting on 10/04/2021 for Depression and Anxiety   HPI  Major Depression, recurrent  ADHD Anxiety History of depression anxiety since age 35, chronic problem. She had been on various medications in past. She has been managed by her OBGYN Dr Tiburcio Pea, has been on Sertraline 50mg  prior to and during pregnancy, and most recently past several months following recent pregnancy, her dose was increased from 50 to 75mg , now takes 50mg  tab one and half per day, request new order here through PCP     Depression screen [facility] 2/9 10/04/2021 03/18/2021 04/20/2020  Decreased Interest 1 0 0  Down, Depressed, Hopeless 0 1 0  PHQ - 2 Score 1 1 0  Altered sleeping 0 0 0  Tired, decreased energy 1 2 0  Change in appetite 0 0 0  Feeling bad or failure about yourself  0 0 0  Trouble concentrating 0 0 0  Moving slowly or fidgety/restless 0 0 0  Suicidal thoughts 0 0 0  PHQ-9 Score 2 3 0  Difficult doing work/chores Not difficult at all Not difficult at all -   GAD 7 : Generalized Anxiety Score 10/04/2021 03/18/2021 04/20/2020 09/28/2018  Nervous, Anxious, on Edge 1 1 1 1   Control/stop worrying 0 0 0 0  Worry too much - different things 0 0 0 1  Trouble relaxing 0 0 0 0  Restless 0 0 0 0  Easily annoyed or irritable 1 1 1 1   Afraid - awful might happen 0 0 0 0  Total GAD 7 Score 2 2 2 3   Anxiety Difficulty Not difficult at all Not difficult at all - Not difficult at all      Social History   Tobacco Use   Smoking status: Never   Smokeless tobacco: Never  Vaping Use   Vaping Use: Never used  Substance Use Topics   Alcohol use: No    Alcohol/week: 0.0 standard drinks   Drug use: No    Review of Systems Per HPI unless specifically indicated above     Objective:    BP (!) 103/56    Pulse 78    Ht 5\' 8"  (1.727 m)    Wt 249 lb (112.9 kg)    SpO2 100%     BMI 37.86 kg/m   Wt Readings from Last 3 Encounters:  10/04/21 249 lb (112.9 kg)  03/18/21 242 lb 9.6 oz (110 kg)  10/17/20 240 lb 3.2 oz (109 kg)    Physical Exam Vitals and nursing note reviewed.  Constitutional:      General: She is not in acute distress.    Appearance: Normal appearance. She is well-developed. She is not diaphoretic.     Comments: Well-appearing, comfortable, cooperative  HENT:     Head: Normocephalic and atraumatic.  Eyes:     General:        Right eye: No discharge.        Left eye: No discharge.     Conjunctiva/sclera: Conjunctivae normal.  Cardiovascular:     Rate and Rhythm: Normal rate.  Pulmonary:     Effort: Pulmonary effort is normal.  Skin:    General: Skin is warm and dry.     Findings: No erythema or rash.  Neurological:     Mental Status: She is alert and oriented to person, place,  and time.  Psychiatric:        Mood and Affect: Mood normal.        Behavior: Behavior normal.        Thought Content: Thought content normal.     Comments: Well groomed, good eye contact, normal speech and thoughts   Results for orders placed or performed during the hospital encounter of 07/27/20  Urine culture   Specimen: Urine, Random  Result Value Ref Range   Specimen Description      URINE, RANDOM Performed at [facility], [address] [place], [place] [address]    Special Requests      NONE Performed at [facility], [address] [place], [place] [address]    Culture MULTIPLE SPECIES PRESENT, SUGGEST RECOLLECTION (A)    Report Status 07/28/2020 FINAL   Lipase, blood  Result Value Ref Range   Lipase 29 11 - 51 U/L  Comprehensive metabolic panel  Result Value Ref Range   Sodium 138 135 - 145 mmol/L   Potassium 3.5 3.5 - 5.1 mmol/L   Chloride 106 98 - 111 mmol/L   CO2 22 22 - 32 mmol/L   Glucose, Bld 122 (H) 70 - 99 mg/dL   BUN 10 6 - 20 mg/dL   Creatinine, Ser 9.23 0.44 - 1.00 mg/dL   Calcium 9.1 8.9 - 30.0  mg/dL   Total Protein 7.7 6.5 - 8.1 g/dL   Albumin 4.3 3.5 - 5.0 g/dL   AST 19 15 - 41 U/L   ALT 19 0 - 44 U/L   Alkaline Phosphatase 66 38 - 126 U/L   Total Bilirubin 0.9 0.3 - 1.2 mg/dL   GFR, Estimated >76 >22 mL/min   Anion gap 10 5 - 15  CBC  Result Value Ref Range   WBC 10.7 (H) 4.0 - 10.5 K/uL   RBC 4.85 3.87 - 5.11 MIL/uL   Hemoglobin 13.9 12.0 - 15.0 g/dL   HCT 63.3 35.4 - 56.2 %   MCV 84.7 80.0 - 100.0 fL   MCH 28.7 26.0 - 34.0 pg   MCHC 33.8 30.0 - 36.0 g/dL   RDW 56.3 89.3 - 73.4 %   Platelets 266 150 - 400 K/uL   nRBC 0.0 0.0 - 0.2 %  Urinalysis, Complete w Microscopic  Result Value Ref Range   Color, Urine AMBER (A) YELLOW   APPearance CLOUDY (A) CLEAR   Specific Gravity, Urine 1.040 (H) 1.005 - 1.030   pH 5.0 5.0 - 8.0   Glucose, UA NEGATIVE NEGATIVE mg/dL   Hgb urine dipstick SMALL (A) NEGATIVE   Bilirubin Urine SMALL (A) NEGATIVE   Ketones, ur 80 (A) NEGATIVE mg/dL   Protein, ur 287 (A) NEGATIVE mg/dL   Nitrite NEGATIVE NEGATIVE   Leukocytes,Ua MODERATE (A) NEGATIVE   RBC / HPF 11-20 0 - 5 RBC/hpf   WBC, UA >50 (H) 0 - 5 WBC/hpf   Bacteria, UA RARE (A) NONE SEEN   Squamous Epithelial / LPF 11-20 0 - 5   Mucus PRESENT   POC urine preg, ED  Result Value Ref Range   Preg Test, Ur NEGATIVE NEGATIVE      Assessment & Plan:   Problem List Items Addressed This Visit     Major depressive disorder, recurrent episode, in partial remission (HCC) - Primary   Relevant Medications   sertraline (ZOLOFT) 50 MG tablet   ADD (attention deficit disorder)    Controlled depression  Refilled Sertraline Zoloft 50mg  take one and  half for 75mg  daily 90 day with 1 refill   Meds ordered this encounter  Medications   sertraline (ZOLOFT) 50 MG tablet    Sig: Take 1.5 tablets (75 mg total) by mouth daily.    Dispense:  135 tablet    Refill:  1      Follow up plan: Return in about 6 months (around 04/03/2022) for 6 month Annual Physical / Med refill.  04/05/2022, DO [facility] 10/04/2021, 3:07 PM

## 2021-10-04 NOTE — Patient Instructions (Addendum)
Thank you for coming to the office today.  Refilled Sertraline Zoloft 50mg  take one and half for 75mg  daily 90 day with 1 refill  Please schedule a Follow-up Appointment to: Return in about 6 months (around 04/03/2022) for 6 month Annual Physical / Med refill.  If you have any other questions or concerns, please feel free to call the office or send a message through [place]. You may also schedule an earlier appointment if necessary.  Additionally, you may be receiving a survey about your experience at our office within a few days to 1 week by e-mail or mail. We value your feedback.  Nobie Putnam, DO [place]

## 2021-10-09 ENCOUNTER — Other Ambulatory Visit: Payer: Self-pay

## 2021-11-13 ENCOUNTER — Ambulatory Visit
Admission: EM | Admit: 2021-11-13 | Discharge: 2021-11-13 | Disposition: A | Discharge status: Home / Self Care | Payer: BC Managed Care – PPO | Attending: Urgent Care | Admitting: Urgent Care

## 2021-11-13 ENCOUNTER — Ambulatory Visit (INDEPENDENT_AMBULATORY_CARE_PROVIDER_SITE_OTHER): Payer: BC Managed Care – PPO

## 2021-11-13 ENCOUNTER — Other Ambulatory Visit: Payer: Self-pay

## 2021-11-13 ENCOUNTER — Encounter: Payer: Self-pay | Admitting: Emergency Medicine

## 2021-11-13 DIAGNOSIS — M25512 Pain in left shoulder: Secondary | ICD-10-CM

## 2021-11-13 DIAGNOSIS — M62838 Other muscle spasm: Secondary | ICD-10-CM

## 2021-11-13 DIAGNOSIS — M542 Cervicalgia: Secondary | ICD-10-CM

## 2021-11-13 DIAGNOSIS — M898X1 Other specified disorders of bone, shoulder: Secondary | ICD-10-CM | POA: Diagnosis not present

## 2021-11-13 DIAGNOSIS — W19XXXA Unspecified fall, initial encounter: Secondary | ICD-10-CM

## 2021-11-13 MED ORDER — TIZANIDINE HCL 4 MG PO TABS: 4.0000 mg | ORAL_TABLET | Freq: Every day | ORAL | 0 refills | Status: DC

## 2021-11-13 MED ORDER — NAPROXEN 500 MG PO TABS: 500.0000 mg | ORAL_TABLET | Freq: Two times a day (BID) | ORAL | 0 refills | Status: DC

## 2021-11-13 NOTE — ED Provider Notes (Signed)
?[facility] ? ? ?MRN: [id number] DOB: [birth year] ? ?Subjective:  ? ?Madison Mack is a 26 y.o. female presenting for hurting her left shoulder, scapula and neck. Patient was playing a Chief Executive Officer game, tripped and fell forward landing primarily on her left upper side. Has bruising over the scapular area where her pain is the worst. Her neck was hurting but has improved. No loss of consciousness, dizziness, confusion, bony deformity. Has not used medication for relief.  ? ?No current facility-administered medications for this encounter. ? ?Current Outpatient Medications:  ?  sertraline (ZOLOFT) 50 MG tablet, Take 1.5 tablets (75 mg total) by mouth daily., Disp: 135 tablet, Rfl: 1  ? ?Allergies  ?Allergen Reactions  ? Penicillins Rash  ?  Has patient had a PCN reaction causing immediate rash, facial/tongue/throat swelling, SOB or lightheadedness with hypotension: SWELLING FEET AND HANDS, HIVES ?Has patient had a PCN reaction causing severe rash involving mucus membranes or skin necrosis: NO ?Has patient had a PCN reaction that required hospitalization: NO ?Has patient had a PCN reaction occurring within the last 10 years: NO ?If all of the above answers are "NO", then may proceed with Cephalosporin use. ?  ? Amoxicillin   ? Doxycycline Hives  ? ? ?Past Medical History:  ?Diagnosis Date  ? Allergy   ? Anxiety   ? HA (headache)   ? Nausea & vomiting   ?  ? ?Past Surgical History:  ?Procedure Laterality Date  ? ANKLE ARTHODESIS W/ ARTHROSCOPY Right   ? ANKLE FRACTURE SURGERY Right 04/08  ? ESOPHAGOGASTRODUODENOSCOPY (EGD) WITH PROPOFOL N/A 08/10/2019  ? Procedure: ESOPHAGOGASTRODUODENOSCOPY (EGD) WITH PROPOFOL;  Surgeon: Pasty Spillers, MD;  Location: [facility] ENDOSCOPY;  Service: Endoscopy;  Laterality: N/A;  ? ? ?Family History  ?Problem Relation Age of Onset  ? Hyperlipidemia Father   ? Hypertension Mother   ? ADD / ADHD Maternal Aunt   ? Alcohol abuse Maternal Aunt   ? Drug abuse  Maternal Aunt   ? Anxiety disorder Maternal Aunt   ? Alcohol abuse Maternal Uncle   ? Drug abuse Maternal Uncle   ? Anxiety disorder Paternal Grandfather   ? Depression Paternal Grandfather   ? Dementia Paternal Grandmother   ? ? ?Social History  ? ?Tobacco Use  ? Smoking status: Never  ? Smokeless tobacco: Never  ?Vaping Use  ? Vaping Use: Never used  ?Substance Use Topics  ? Alcohol use: No  ?  Alcohol/week: 0.0 standard drinks  ? Drug use: No  ? ? ?ROS ? ? ?Objective:  ? ?Vitals: ?BP (!) 157/89 (BP Location: Right Arm)   Pulse 86   Temp 98.5 ?F (36.9 ?C) (Oral)   Resp 18   Ht 5\' 8"  (1.727 m)   Wt 240 lb (108.9 kg)   SpO2 99%   Breastfeeding No   BMI 36.49 kg/m?  ? ?Physical Exam ?Constitutional:   ?   General: She is not in acute distress. ?   Appearance: Normal appearance. She is well-developed. She is not ill-appearing, toxic-appearing or diaphoretic.  ?HENT:  ?   Head: Normocephalic and atraumatic.  ?   Nose: Nose normal.  ?   Mouth/Throat:  ?   Mouth: Mucous membranes are moist.  ?Eyes:  ?   General: No scleral icterus.    ?   Right eye: No discharge.     ?   Left eye: No discharge.  ?   Extraocular Movements: Extraocular movements intact.  ?Cardiovascular:  ?  Rate and Rhythm: Normal rate.  ?Pulmonary:  ?   Effort: Pulmonary effort is normal.  ?Musculoskeletal:  ?   Left shoulder: Tenderness (deltoids, trapezius) present. No swelling, deformity, effusion, laceration, bony tenderness or crepitus. Decreased range of motion. Normal strength.  ?   Cervical back: Spasms (left trapezius) and tenderness (left trapezius) present. No swelling, edema, deformity, erythema, signs of trauma, lacerations, rigidity, torticollis, bony tenderness or crepitus. Pain with movement (left trapezius) present. Normal range of motion.  ?Skin: ?   General: Skin is warm and dry.  ?Neurological:  ?   General: No focal deficit present.  ?   Mental Status: She is alert and oriented to person, place, and time.  ?Psychiatric:      ?   Mood and Affect: Mood normal.     ?   Behavior: Behavior normal.     ?   Thought Content: Thought content normal.     ?   Judgment: Judgment normal.  ? ? ?DG Shoulder Left ? ?Result Date: 11/13/2021 ?CLINICAL DATA:  [place] and injured left shoulder. EXAM: LEFT SHOULDER - 2+ VIEW COMPARISON:  None. FINDINGS: The glenohumeral and AC joints are maintained. No acute fracture. No abnormal soft tissue calcification. No upper left rib fractures. The visualized left lung is grossly clear. IMPRESSION: No acute bony findings. Electronically Signed   By: Marijo Sanes M.D.   On: 11/13/2021 17:44   ? ? ?Assessment and Plan :  ? ?PDMP not reviewed this encounter. ? ?1. Pain of left scapula   ?2. Acute pain of left shoulder   ?3. Neck pain   ?4. Trapezius muscle spasm   ? ? Recommended naproxen, tizanidine for her musculoskeletal pain. No signs of fracture. Counseled patient on potential for adverse effects with medications prescribed/recommended today, ER and return-to-clinic precautions discussed, patient verbalized understanding. ? ?  ?Jaynee Eagles, PA-C ?11/13/21 1758 ? ?

## 2021-11-13 NOTE — ED Triage Notes (Addendum)
Pt reports was playing virtual reality game and reports tripped and fell and hit head on couch. Pt reports neck popped x3 times and reports left shoulder pain since Sunday.pt reports pulling sensation when lets left arm hang. Pt also reports "tender spot" to left posterior shoulder. ?

## 2022-03-05 ENCOUNTER — Ambulatory Visit
Admission: EM | Admit: 2022-03-05 | Discharge: 2022-03-05 | Disposition: A | Discharge status: Home / Self Care | Payer: BC Managed Care – PPO | Attending: Nurse Practitioner | Admitting: Nurse Practitioner

## 2022-03-05 ENCOUNTER — Other Ambulatory Visit: Payer: Self-pay

## 2022-03-05 ENCOUNTER — Encounter: Payer: Self-pay | Admitting: Emergency Medicine

## 2022-03-05 DIAGNOSIS — T7840XA Allergy, unspecified, initial encounter: Secondary | ICD-10-CM | POA: Diagnosis not present

## 2022-03-05 DIAGNOSIS — R21 Rash and other nonspecific skin eruption: Secondary | ICD-10-CM

## 2022-03-05 MED ORDER — FAMOTIDINE 20 MG PO TABS: 20.0000 mg | ORAL_TABLET | Freq: Two times a day (BID) | ORAL | 0 refills | Status: DC

## 2022-03-05 MED ORDER — TRIAMCINOLONE ACETONIDE 0.1 % EX OINT: 1.0000 | TOPICAL_OINTMENT | Freq: Two times a day (BID) | CUTANEOUS | 0 refills | Status: DC

## 2022-03-05 MED ORDER — PREDNISONE 10 MG (21) PO TBPK: ORAL_TABLET | ORAL | 0 refills | Status: AC

## 2022-03-05 NOTE — ED Provider Notes (Signed)
[facility]    CSN: [id number] Arrival date & time: 03/05/22  0850      History   Chief Complaint Chief Complaint  Patient presents with   Rash    HPI Madison Mack is a 26 y.o. female.   Presents with 1 week of rash to hands, arms, trunk, and legs that she noticed after mowing the grass about 1 week ago.  Reports this morning, she woke up and her bottom lip was swollen.  She thinks one of the areas busted open and drained on her face overnight.  She took a Benadryl and the swelling is gone down back to normal.  She reports the rash is itchy, red.  She denies drainage, oozing, scaling or blisters.  She has not had any fevers, or new nausea/vomiting.  Does have some baseline nausea in the morning.  She denies any recent change in detergents, soaps, or personal care products.  She does have allergies and takes Benadryl daily in the morning as other oral antihistamines have not worked for her well in the past.  She denies any current shortness of breath, throat or tongue swelling, new muscle pain or joint aches.     Past Medical History:  Diagnosis Date   Allergy    Anxiety    HA (headache)    Nausea & vomiting     Patient Active Problem List   Diagnosis Date Noted   BMI 37.0-37.9, adult 08/29/2019   Intractable vomiting    Barrett's esophagus without dysplasia    Major depressive disorder, recurrent episode, in partial remission (HCC) 09/08/2018   Cyclic vomiting syndrome 09/08/2018   ADD (attention deficit disorder) 09/08/2018    Past Surgical History:  Procedure Laterality Date   ANKLE ARTHODESIS W/ ARTHROSCOPY Right    ANKLE FRACTURE SURGERY Right 04/08   ESOPHAGOGASTRODUODENOSCOPY (EGD) WITH PROPOFOL N/A 08/10/2019   Procedure: ESOPHAGOGASTRODUODENOSCOPY (EGD) WITH PROPOFOL;  Surgeon: Pasty Spillers, MD;  Location: [facility] END[facility]COPY;  Service: Endoscopy;  Laterality: N/A;    OB History     Gravida  2   Para  2   Term  2   Preterm       AB      Living  2      SAB      IAB      Ectopic      Multiple  0   Live Births  2            Home Medications    Prior to Admission medications   Medication Sig Start Date End Date Taking? Authorizing Provider  famotidine (PEPCID) 20 MG tablet Take 1 tablet (20 mg total) by mouth 2 (two) times daily. 03/05/22  Yes Valentino Nose, NP  predniSONE (STERAPRED UNI-PAK 21 TAB) 10 MG (21) TBPK tablet Take 6 tablets (60 mg total) by mouth daily for 1 day, THEN 5 tablets (50 mg total) daily for 1 day, THEN 4 tablets (40 mg total) daily for 1 day, THEN 3 tablets (30 mg total) daily for 1 day, THEN 2 tablets (20 mg total) daily for 1 day, THEN 1 tablet (10 mg total) daily for 1 day. 03/05/22 03/11/22 Yes Cathlean Marseilles A, NP  triamcinolone ointment (KENALOG) 0.1 % Apply 1 Application topically 2 (two) times daily. Apply sparingly to affected areas for no more than 14 days in a row 03/05/22  Yes Cathlean Marseilles A, NP  naproxen (NAPROSYN) 500 MG tablet Take 1 tablet (500 mg total) by  mouth 2 (two) times daily with a meal. 11/13/21   Wallis Bamberg, PA-C  sertraline (ZOLOFT) 50 MG tablet Take 1.5 tablets (75 mg total) by mouth daily. 10/04/21   Karamalegos, Netta Neat, DO  tiZANidine (ZANAFLEX) 4 MG tablet Take 1 tablet (4 mg total) by mouth at bedtime. 11/13/21   Wallis Bamberg, PA-C    Family History Family History  Problem Relation Age of Onset   Hyperlipidemia Father    Hypertension Mother    ADD / ADHD Maternal Aunt    Alcohol abuse Maternal Aunt    Drug abuse Maternal Aunt    Anxiety disorder Maternal Aunt    Alcohol abuse Maternal Uncle    Drug abuse Maternal Uncle    Anxiety disorder Paternal Grandfather    Depression Paternal Grandfather    Dementia Paternal Grandmother     Social History Social History   Tobacco Use   Smoking status: Never   Smokeless tobacco: Never  Vaping Use   Vaping Use: Never used  Substance Use Topics   Alcohol use: No    Alcohol/week: 0.0  standard drinks of alcohol   Drug use: No     Allergies   Penicillins, Amoxicillin, and Doxycycline   Review of Systems Review of Systems Per HPI  Physical Exam Triage Vital Signs ED Triage Vitals [03/05/22 0939]  Enc Vitals Group     BP (!) 135/91     Pulse Rate 90     Resp 18     Temp 98.1 F (36.7 C)     Temp Source Oral     SpO2 99 %     Weight      Height      Head Circumference      Peak Flow      Pain Score 0     Pain Loc      Pain Edu?      Excl. in GC?    No data found.  Updated Vital Signs BP (!) 135/91 (BP Location: Right Arm)   Pulse 90   Temp 98.1 F (36.7 C) (Oral)   Resp 18   SpO2 99%   Breastfeeding No   Visual Acuity Right Eye Distance:   Left Eye Distance:   Bilateral Distance:    Right Eye Near:   Left Eye Near:    Bilateral Near:     Physical Exam Vitals and nursing note reviewed.  Constitutional:      General: She is not in acute distress.    Appearance: Normal appearance. She is not toxic-appearing.  HENT:     Mouth/Throat:     Mouth: Mucous membranes are moist.     Pharynx: Oropharynx is clear. No oropharyngeal exudate or posterior oropharyngeal erythema.  Pulmonary:     Effort: Pulmonary effort is normal. No respiratory distress.     Breath sounds: No wheezing, rhonchi or rales.  Skin:    Capillary Refill: Capillary refill takes less than 2 seconds.     Findings: Erythema and rash present. Rash is macular, papular and urticarial. Rash is not crusting, purpuric, pustular, scaling or vesicular.     Comments: Widespread, slightly erythematous maculopapular rash to bilateral arms, trunk, ears.  No active drainage or oozing.  No fluctuance, warmth, tenderness to touch.  Neurological:     Mental Status: She is alert and oriented to person, place, and time.  Psychiatric:        Behavior: Behavior is cooperative.      UC Treatments / Results  Labs (all labs ordered are listed, but only abnormal results are displayed) Labs  Reviewed - No data to display  EKG   Radiology No results found.  Procedures Procedures (including critical care time)  Medications Ordered in UC Medications - No data to display  Initial Impression / Assessment and Plan / UC Course  I have reviewed the triage vital signs and the nursing notes.  Pertinent labs & imaging results that were available during my care of the patient were reviewed by me and considered in my medical decision making (see chart for details).    Patient is a very pleasant, well-appearing 26 year old female presenting for widespread rash today.  There is concern for systemic allergic reaction given hive-like nature of the rash, lip swelling.  Treat with prednisone taper, twice daily oral antihistamine, Pepcid twice daily.  ER precaution discussed.  Seek care if symptoms persist worsens with treatment. Final Clinical Impressions(s) / UC Diagnoses   Final diagnoses:  Rash and nonspecific skin eruption  Allergic reaction, initial encounter     Discharge Instructions      - Please start the prednisone today to help with the allergic reaction -Start taking Benadryl twice daily to help with the itching as well as the Pepcid twice daily -Seek care if your symptoms worsen or if you develop any shortness of breath, throat or tongue swelling, difficulty breathing, vomiting, foaming at the mouth, go to the ER    ED Prescriptions     Medication Sig Dispense Auth. Provider   predniSONE (STERAPRED UNI-PAK 21 TAB) 10 MG (21) TBPK tablet Take 6 tablets (60 mg total) by mouth daily for 1 day, THEN 5 tablets (50 mg total) daily for 1 day, THEN 4 tablets (40 mg total) daily for 1 day, THEN 3 tablets (30 mg total) daily for 1 day, THEN 2 tablets (20 mg total) daily for 1 day, THEN 1 tablet (10 mg total) daily for 1 day. 21 tablet Cathlean Marseilles A, NP   famotidine (PEPCID) 20 MG tablet Take 1 tablet (20 mg total) by mouth 2 (two) times daily. 14 tablet Cathlean Marseilles  A, NP   triamcinolone ointment (KENALOG) 0.1 % Apply 1 Application topically 2 (two) times daily. Apply sparingly to affected areas for no more than 14 days in a row 15 g Valentino Nose, NP      PDMP not reviewed this encounter.   Valentino Nose, NP 03/05/22 1013

## 2022-03-05 NOTE — ED Triage Notes (Addendum)
Pt reports poison ivy to left ear, left arm, bilateral feet, and posterior head x1 week. Pt states woke up at 4am this am and reports bottom lip swelling, bilateral nare swelling. Symptoms have improved and back at baseline after benadryl. Pt reports continued generalized itching in triage.  Pt reports had to leave work and will be returning to work post visit and would like work note.

## 2022-03-05 NOTE — Discharge Instructions (Signed)
-   Please start the prednisone today to help with the allergic reaction -Start taking Benadryl twice daily to help with the itching as well as the Pepcid twice daily -Seek care if your symptoms worsen or if you develop any shortness of breath, throat or tongue swelling, difficulty breathing, vomiting, foaming at the mouth, go to the ER

## 2022-04-03 ENCOUNTER — Encounter: Payer: BC Managed Care – PPO | Admitting: Family Medicine

## 2022-04-09 ENCOUNTER — Ambulatory Visit (INDEPENDENT_AMBULATORY_CARE_PROVIDER_SITE_OTHER): Payer: BC Managed Care – PPO | Admitting: Family Medicine

## 2022-04-09 ENCOUNTER — Encounter: Payer: Self-pay | Admitting: Family Medicine

## 2022-04-09 VITALS — BP 132/76 | HR 70 | Ht 68.0 in | Wt 250.0 lb

## 2022-04-09 DIAGNOSIS — R7309 Other abnormal glucose: Secondary | ICD-10-CM

## 2022-04-09 DIAGNOSIS — Z0000 Encounter for general adult medical examination without abnormal findings: Secondary | ICD-10-CM | POA: Diagnosis not present

## 2022-04-09 DIAGNOSIS — F3341 Major depressive disorder, recurrent, in partial remission: Secondary | ICD-10-CM | POA: Diagnosis not present

## 2022-04-09 DIAGNOSIS — E669 Obesity, unspecified: Secondary | ICD-10-CM

## 2022-04-09 DIAGNOSIS — F988 Other specified behavioral and emotional disorders with onset usually occurring in childhood and adolescence: Secondary | ICD-10-CM

## 2022-04-09 MED ORDER — SERTRALINE HCL 100 MG PO TABS: 100.0000 mg | ORAL_TABLET | Freq: Every day | ORAL | 3 refills | Status: DC

## 2022-04-09 MED ORDER — BUPROPION HCL ER (XL) 150 MG PO TB24: 150.0000 mg | ORAL_TABLET | Freq: Every day | ORAL | 2 refills | Status: DC

## 2022-04-09 NOTE — Patient Instructions (Addendum)
Thank you for coming to the office today.  Dose increase Sertraline from 75 (1.5 tab) to 100mg  - new rx sent. One a day  Add on new med. Wellbutrin XL (Bupropion) 150mg  daily in the morning to help with mood.  Labs today  In future after 30 days if want to dose increase the wellbutrin or change to 90 day, let me know.  Please schedule a Follow-up Appointment to: Return in about 1 year (around 04/10/2023) for 1 year Annual Physical AM fasting lab after.  If you have any other questions or concerns, please feel free to call the office or send a message through MyChart. You may also schedule an earlier appointment if necessary.  Additionally, you may be receiving a survey about your experience at our office within a few days to 1 week by e-mail or mail. We value your feedback.  , DO [facility], Saralyn Pilar

## 2022-04-09 NOTE — Progress Notes (Unsigned)
Subjective:    Patient ID: Madison Mack, female    DOB: [birth year], 26 y.o.   MRN: [id number]  Madison Mack is a 26 y.o. female presenting on 04/09/2022 for Annual Exam   HPI  Here for Annual Physical and Lab Review  Major Depression, recurrent  ADHD Anxiety History of depression anxiety since age 63, chronic problem. She had been on various medications in past. She has been managed by her OBGYN Dr Tiburcio Pea, has been on Sertraline 50mg  prior to and during pregnancy, and most recently past several months following recent pregnancy, her dose was increased from 50 to 75mg , now takes 50mg  tab one and half per day, request new order here through PCP  Taking Sertraline 75mg  daily, now off for 1 month. She admits feeling more angry or irritable, she said if she feels anxious or stressed and she will break out in hives or urticarial rash, it went away after she said she calmed down but it did not resolve w/ poison ivy treatment. - Tried 2nd gen anti histamine Loratadine, Zyrtec, Allegra without relief - Taking Benadryl daily - She said her son started Kindergarten and her son is being tested for Autism. 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 She has been in school now working for 7 weeks this school year. Hives started 2 weeks in.  HM Declines Flu Vaccine     04/09/2022    9:53 AM 10/04/2021    2:52 PM 03/18/2021    3:55 PM  Depression screen PHQ 2/9  Decreased Interest 1 1 0  Down, Depressed, Hopeless 1 0 1  PHQ - 2 Score 2 1 1   Altered sleeping 0 0 0  Tired, decreased energy 0 1 2  Change in appetite 0 0 0  Feeling bad or failure about yourself  1 0 0  Trouble concentrating 0 0 0  Moving slowly or fidgety/restless 0 0 0  Suicidal thoughts 0 0 0  PHQ-9 Score 3 2 3   Difficult doing work/chores Somewhat difficult Not difficult at all Not difficult at all      04/09/2022    9:53 AM 10/04/2021    2:52 PM 03/18/2021    3:56 PM  04/20/2020    3:00 PM  GAD 7 : Generalized Anxiety Score  Nervous, Anxious, on Edge 2 1 1 1   Control/stop worrying 0 0 0 0  Worry too much - different things 1 0 0 0  Trouble relaxing 0 0 0 0  Restless 1 0 0 0  Easily annoyed or irritable 2 1 1 1   Afraid - awful might happen 0 0 0 0  Total GAD 7 Score 6 2 2 2   Anxiety Difficulty Somewhat difficult Not difficult at all Not difficult at all       Past Medical History:  Diagnosis Date   Allergy    Anxiety    HA (headache)    Nausea & vomiting    Past Surgical History:  Procedure Laterality Date   ANKLE ARTHODESIS W/ ARTHROSCOPY Right    ANKLE FRACTURE SURGERY Right 04/08   ESOPHAGOGASTRODUODENOSCOPY (EGD) WITH PROPOFOL N/A 08/10/2019   Procedure: ESOPHAGOGASTRODUODENOSCOPY (EGD) WITH PROPOFOL;  Surgeon: 04/11/2022, MD;  Location: [facility] ENDOSCOPY;  Service: Endoscopy;  Laterality: N/A;   Social History   Socioeconomic History   Marital status: Married    Spouse name: thomas   Number of children: 1   Years of education: Not on file  Highest education level: Bachelor's degree (e.g., BA, AB, BS)  Occupational History   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  Other Topics Concern   Not on file  Social History Narrative   Not on file   Social Determinants of Health   Financial Resource Strain: Low Risk  (06/29/2017)   Overall Financial Resource Strain (CARDIA)    Difficulty of Paying Living Expenses: Not hard at all  Food Insecurity: No Food Insecurity (06/29/2017)   Hunger Vital Sign    Worried About Running Out of Food in the Last Year: Never true    Ran Out of Food in the Last Year: Never true   Transportation Needs: No Transportation Needs (06/29/2017)   PRAPARE - Hydrologist (Medical): No    Lack of Transportation (Non-Medical): No  Physical Activity: Inactive (06/29/2017)   Exercise Vital Sign    Days of Exercise per Week: 0 days    Minutes of Exercise per Session: 0 min  Stress: Stress Concern Present (06/29/2017)   [place]    Feeling of Stress : To some extent  Social Connections: Somewhat Isolated (06/29/2017)   Social Connection and Isolation Panel [NHANES]    Frequency of Communication with Friends and Family: More than three times a week    Frequency of Social Gatherings with Friends and Family: Never    Attends Religious Services: Never    Marine scientist or Organizations: No    Attends Archivist Meetings: Never    Marital Status: Married  Human resources officer Violence: Not At Risk (06/29/2017)   Humiliation, Afraid, Rape, and Kick questionnaire    Fear of Current or Ex-Partner: No    Emotionally Abused: No    Physically Abused: No    Sexually Abused: No   Family History  Problem Relation Age of Onset   Hyperlipidemia Father    Hypertension Mother    ADD / ADHD Maternal Aunt    Alcohol abuse Maternal Aunt    Drug abuse Maternal Aunt    Anxiety disorder Maternal Aunt    Alcohol abuse Maternal Uncle    Drug abuse Maternal Uncle    Anxiety disorder Paternal Grandfather    Depression Paternal Grandfather    Dementia Paternal Grandmother    Current Outpatient Medications on File Prior to Visit  Medication Sig   famotidine (PEPCID) 20 MG tablet Take 1 tablet (20 mg total) by mouth 2 (two) times daily.   naproxen (NAPROSYN) 500 MG tablet Take 1 tablet (500 mg total) by mouth 2 (two) times daily with a meal.   tiZANidine (ZANAFLEX) 4 MG tablet Take 1 tablet (4 mg total) by mouth at bedtime.   triamcinolone ointment (KENALOG) 0.1 % Apply 1  Application topically 2 (two) times daily. Apply sparingly to affected areas for no more than 14 days in a row   No current facility-administered medications on file prior to visit.    Review of Systems  Constitutional:  Negative for activity change, appetite change, chills, diaphoresis, fatigue and fever.  HENT:  Negative for congestion and hearing loss.  Eyes:  Negative for visual disturbance.  Respiratory:  Negative for cough, chest tightness, shortness of breath and wheezing.   Cardiovascular:  Negative for chest pain, palpitations and leg swelling.  Gastrointestinal:  Negative for abdominal pain, constipation, diarrhea, nausea and vomiting.  Genitourinary:  Negative for dysuria, frequency and hematuria.  Musculoskeletal:  Negative for arthralgias and neck pain.  Skin:  Negative for rash.  Neurological:  Negative for dizziness, weakness, light-headedness, numbness and headaches.  Hematological:  Negative for adenopathy.  Psychiatric/Behavioral:  Negative for behavioral problems, dysphoric mood and sleep disturbance.    Per HPI unless specifically indicated above      Objective:    BP 132/76   Pulse 70   Ht 5\' 8"  (1.727 m)   Wt 250 lb (113.4 kg)   SpO2 100%   BMI 38.01 kg/m   Wt Readings from Last 3 Encounters:  04/09/22 250 lb (113.4 kg)  11/13/21 240 lb (108.9 kg)  10/04/21 249 lb (112.9 kg)    Physical Exam Vitals and nursing note reviewed.  Constitutional:      General: She is not in acute distress.    Appearance: She is well-developed. She is obese. She is not diaphoretic.     Comments: Well-appearing, comfortable, cooperative  HENT:     Head: Normocephalic and atraumatic.  Eyes:     General:        Right eye: No discharge.        Left eye: No discharge.     Conjunctiva/sclera: Conjunctivae normal.     Pupils: Pupils are equal, round, and reactive to light.  Neck:     Thyroid: No thyromegaly.  Cardiovascular:     Rate and Rhythm: Normal rate and regular  rhythm.     Pulses: Normal pulses.     Heart sounds: Normal heart sounds. No murmur heard. Pulmonary:     Effort: Pulmonary effort is normal. No respiratory distress.     Breath sounds: Normal breath sounds. No wheezing or rales.  Abdominal:     General: Bowel sounds are normal. There is no distension.     Palpations: Abdomen is soft. There is no mass.     Tenderness: There is no abdominal tenderness.  Musculoskeletal:        General: No tenderness. Normal range of motion.     Cervical back: Normal range of motion and neck supple.     Right lower leg: No edema.     Left lower leg: No edema.     Comments: Upper / Lower Extremities: - Normal muscle tone, strength bilateral upper extremities 5/5, lower extremities 5/5  Lymphadenopathy:     Cervical: No cervical adenopathy.  Skin:    General: Skin is warm and dry.     Findings: No erythema or rash.  Neurological:     Mental Status: She is alert and oriented to person, place, and time.     Comments: Distal sensation intact to light touch all extremities  Psychiatric:        Mood and Affect: Mood normal.        Behavior: Behavior normal.        Thought Content: Thought content normal.     Comments: Well groomed, good eye contact, normal speech and thoughts      Results for orders placed or performed during the hospital encounter of 07/27/20  Urine culture   Specimen: Urine, Random  Result Value Ref Range   Specimen Description      URINE, RANDOM  Performed at [facility], [address] [place], [place] [address]    Special Requests      NONE Performed at [facility], [address] [place], [place] [address]    Culture MULTIPLE SPECIES PRESENT, SUGGEST RECOLLECTION (A)    Report Status 07/28/2020 FINAL   Lipase, blood  Result Value Ref Range   Lipase 29 11 - 51 U/L  Comprehensive metabolic panel  Result Value Ref Range   Sodium 138 135 - 145 mmol/L   Potassium 3.5 3.5 - 5.1 mmol/L   Chloride  106 98 - 111 mmol/L   CO2 22 22 - 32 mmol/L   Glucose, Bld 122 (H) 70 - 99 mg/dL   BUN 10 6 - 20 mg/dL   Creatinine, Ser 5.78 0.44 - 1.00 mg/dL   Calcium 9.1 8.9 - 46.9 mg/dL   Total Protein 7.7 6.5 - 8.1 g/dL   Albumin 4.3 3.5 - 5.0 g/dL   AST 19 15 - 41 U/L   ALT 19 0 - 44 U/L   Alkaline Phosphatase 66 38 - 126 U/L   Total Bilirubin 0.9 0.3 - 1.2 mg/dL   GFR, Estimated >62 >95 mL/min   Anion gap 10 5 - 15  CBC  Result Value Ref Range   WBC 10.7 (H) 4.0 - 10.5 K/uL   RBC 4.85 3.87 - 5.11 MIL/uL   Hemoglobin 13.9 12.0 - 15.0 g/dL   HCT 28.4 13.2 - 44.0 %   MCV 84.7 80.0 - 100.0 fL   MCH 28.7 26.0 - 34.0 pg   MCHC 33.8 30.0 - 36.0 g/dL   RDW 10.2 72.5 - 36.6 %   Platelets 266 150 - 400 K/uL   nRBC 0.0 0.0 - 0.2 %  Urinalysis, Complete w Microscopic  Result Value Ref Range   Color, Urine AMBER (A) YELLOW   APPearance CLOUDY (A) CLEAR   Specific Gravity, Urine 1.040 (H) 1.005 - 1.030   pH 5.0 5.0 - 8.0   Glucose, UA NEGATIVE NEGATIVE mg/dL   Hgb urine dipstick SMALL (A) NEGATIVE   Bilirubin Urine SMALL (A) NEGATIVE   Ketones, ur 80 (A) NEGATIVE mg/dL   Protein, ur 440 (A) NEGATIVE mg/dL   Nitrite NEGATIVE NEGATIVE   Leukocytes,Ua MODERATE (A) NEGATIVE   RBC / HPF 11-20 0 - 5 RBC/hpf   WBC, UA >50 (H) 0 - 5 WBC/hpf   Bacteria, UA RARE (A) NONE SEEN   Squamous Epithelial / LPF 11-20 0 - 5   Mucus PRESENT   POC urine preg, ED  Result Value Ref Range   Preg Test, Ur NEGATIVE NEGATIVE      Assessment & Plan:   Problem List Items Addressed This Visit     ADD (attention deficit disorder)   Major depressive disorder, recurrent episode, in partial remission (HCC)   Relevant Medications   buPROPion (WELLBUTRIN XL) 150 MG 24 hr tablet   sertraline (ZOLOFT) 100 MG tablet   Other Visit Diagnoses     Annual physical exam    -  Primary   Relevant Orders   COMPLETE METABOLIC PANEL WITH GFR   CBC with Differential/Platelet   Lipid panel   TSH   Hemoglobin A1c   Obesity  (BMI 35.0-39.9 without comorbidity)       Relevant Orders   COMPLETE METABOLIC PANEL WITH GFR   CBC with Differential/Platelet   Lipid panel   TSH   Hemoglobin A1c   Abnormal glucose       Relevant  Orders   Hemoglobin A1c       Updated Health Maintenance information Fasting lab today Encouraged improvement to lifestyle with diet and exercise Goal of weight loss  Major depression recurrent in partial remission Some increased irritability  Dose increase Sertraline from 75 (1.5 tab) to 100mg  - new rx sent. One a day  Add on new med. Wellbutrin XL (Bupropion) 150mg  daily in the morning to help with mood.  Labs today  In future after 30 days if want to dose increase the wellbutrin or change to 90 day, let me know.  Meds ordered this encounter  Medications   buPROPion (WELLBUTRIN XL) 150 MG 24 hr tablet    Sig: Take 1 tablet (150 mg total) by mouth daily.    Dispense:  30 tablet    Refill:  2   sertraline (ZOLOFT) 100 MG tablet    Sig: Take 1 tablet (100 mg total) by mouth daily.    Dispense:  90 tablet    Refill:  3      Follow up plan: Return in about 1 year (around 04/10/2023) for 1 year Annual Physical AM fasting lab after.  Nobie Putnam, [facility] 04/09/2022, 10:06 AM

## 2022-04-10 LAB — CBC WITH DIFFERENTIAL/PLATELET
Absolute Monocytes: 312 cells/uL (ref 200–950)
Basophils Absolute: 39 cells/uL (ref 0–200)
Basophils Relative: 0.6 %
Eosinophils Absolute: 208 cells/uL (ref 15–500)
Eosinophils Relative: 3.2 %
HCT: 42.9 % (ref 35.0–45.0)
Hemoglobin: 14.4 g/dL (ref 11.7–15.5)
Lymphs Abs: 1918 cells/uL (ref 850–3900)
MCH: 30.3 pg (ref 27.0–33.0)
MCHC: 33.6 g/dL (ref 32.0–36.0)
MCV: 90.1 fL (ref 80.0–100.0)
MPV: 10.1 fL (ref 7.5–12.5)
Monocytes Relative: 4.8 %
Neutro Abs: 4024 cells/uL (ref 1500–7800)
Neutrophils Relative %: 61.9 %
Platelets: 279 10*3/uL (ref 140–400)
RBC: 4.76 10*6/uL (ref 3.80–5.10)
RDW: 12 % (ref 11.0–15.0)
Total Lymphocyte: 29.5 %
WBC: 6.5 10*3/uL (ref 3.8–10.8)

## 2022-04-10 LAB — COMPLETE METABOLIC PANEL WITH GFR
AG Ratio: 1.5 (calc) (ref 1.0–2.5)
ALT: 12 U/L (ref 6–29)
AST: 10 U/L (ref 10–30)
Albumin: 4 g/dL (ref 3.6–5.1)
Alkaline phosphatase (APISO): 72 U/L (ref 31–125)
BUN: 11 mg/dL (ref 7–25)
CO2: 28 mmol/L (ref 20–32)
Calcium: 8.9 mg/dL (ref 8.6–10.2)
Chloride: 107 mmol/L (ref 98–110)
Creat: 0.69 mg/dL (ref 0.50–0.96)
Globulin: 2.7 g/dL (calc) (ref 1.9–3.7)
Glucose, Bld: 84 mg/dL (ref 65–99)
Potassium: 4.8 mmol/L (ref 3.5–5.3)
Sodium: 140 mmol/L (ref 135–146)
Total Bilirubin: 0.3 mg/dL (ref 0.2–1.2)
Total Protein: 6.7 g/dL (ref 6.1–8.1)
eGFR: 122 mL/min/{1.73_m2} (ref >=60)

## 2022-04-10 LAB — LIPID PANEL
Cholesterol: 202 mg/dL — ABNORMAL HIGH (ref <200)
HDL: 48 mg/dL — ABNORMAL LOW (ref >=50)
LDL Cholesterol (Calc): 137 mg/dL (calc) — ABNORMAL HIGH
Non-HDL Cholesterol (Calc): 154 mg/dL (calc) — ABNORMAL HIGH (ref <130)
Total CHOL/HDL Ratio: 4.2 (calc) (ref <5.0)
Triglycerides: 76 mg/dL (ref <150)

## 2022-04-10 LAB — TSH: TSH: 2.07 mIU/L

## 2022-04-10 LAB — HEMOGLOBIN A1C
Hgb A1c MFr Bld: 4.9 % of total Hgb (ref <5.7)
Mean Plasma Glucose: 94 mg/dL
eAG (mmol/L): 5.2 mmol/L

## 2022-05-04 ENCOUNTER — Encounter: Payer: Self-pay | Admitting: Family Medicine

## 2022-05-04 DIAGNOSIS — F988 Other specified behavioral and emotional disorders with onset usually occurring in childhood and adolescence: Secondary | ICD-10-CM

## 2022-05-04 DIAGNOSIS — F3341 Major depressive disorder, recurrent, in partial remission: Secondary | ICD-10-CM

## 2022-05-06 MED ORDER — BUPROPION HCL ER (XL) 300 MG PO TB24: 300.0000 mg | ORAL_TABLET | Freq: Every day | ORAL | 2 refills | Status: DC

## 2022-05-12 ENCOUNTER — Ambulatory Visit
Admission: EM | Admit: 2022-05-12 | Discharge: 2022-05-12 | Disposition: A | Discharge status: Home / Self Care | Payer: BC Managed Care – PPO | Attending: Family Medicine | Admitting: Family Medicine

## 2022-05-12 ENCOUNTER — Ambulatory Visit (INDEPENDENT_AMBULATORY_CARE_PROVIDER_SITE_OTHER): Payer: BC Managed Care – PPO

## 2022-05-12 DIAGNOSIS — M25521 Pain in right elbow: Secondary | ICD-10-CM

## 2022-05-12 DIAGNOSIS — S5001XA Contusion of right elbow, initial encounter: Secondary | ICD-10-CM

## 2022-05-12 NOTE — ED Provider Notes (Signed)
[facility]    CSN: [id number] Arrival date & time: 05/12/22  1621      History   Chief Complaint Chief Complaint  Patient presents with   Joint Swelling    HPI Madison Mack is a 26 y.o. female.   Presenting today with 2-day history of right elbow pain, swelling after hitting her elbow on the baby gate while lifting her daughter over it.  She states the area is bruised, swollen and significantly tender to the touch but the elbow itself is moving okay.  Denies numbness, tingling, weakness to the arm.  Trying over-the-counter pain relievers with mild temporary relief.    Past Medical History:  Diagnosis Date   Allergy    Anxiety    HA (headache)    Nausea & vomiting     Patient Active Problem List   Diagnosis Date Noted   BMI 37.0-37.9, adult 08/29/2019   Intractable vomiting    Barrett's esophagus without dysplasia    Major depressive disorder, recurrent episode, in partial remission ([place]) 76/16/0737   Cyclic vomiting syndrome 09/08/2018   ADD (attention deficit disorder) 09/08/2018    Past Surgical History:  Procedure Laterality Date   ANKLE ARTHODESIS W/ ARTHROSCOPY Right    ANKLE FRACTURE SURGERY Right 04/08   ESOPHAGOGASTRODUODENOSCOPY (EGD) WITH PROPOFOL N/A 08/10/2019   Procedure: ESOPHAGOGASTRODUODENOSCOPY (EGD) WITH PROPOFOL;  Surgeon: Virgel Manifold, MD;  Location: [facility] END[facility]COPY;  Service: Endoscopy;  Laterality: N/A;    OB History     Gravida  2   Para  2   Term  2   Preterm      AB      Living  2      SAB      IAB      Ectopic      Multiple  0   Live Births  2            Home Medications    Prior to Admission medications   Medication Sig Start Date End Date Taking? Authorizing Provider  buPROPion (WELLBUTRIN XL) 300 MG 24 hr tablet Take 1 tablet (300 mg total) by mouth daily. 05/06/22   Karamalegos, Devonne Doughty, DO  famotidine (PEPCID) 20 MG tablet Take 1 tablet (20 mg total) by mouth 2 (two)  times daily. 03/05/22   Eulogio Bear, NP  naproxen (NAPROSYN) 500 MG tablet Take 1 tablet (500 mg total) by mouth 2 (two) times daily with a meal. 11/13/21   Jaynee Eagles, PA-C  sertraline (ZOLOFT) 100 MG tablet Take 1 tablet (100 mg total) by mouth daily. 04/09/22   Karamalegos, Devonne Doughty, DO  tiZANidine (ZANAFLEX) 4 MG tablet Take 1 tablet (4 mg total) by mouth at bedtime. 11/13/21   Jaynee Eagles, PA-C  triamcinolone ointment (KENALOG) 0.1 % Apply 1 Application topically 2 (two) times daily. Apply sparingly to affected areas for no more than 14 days in a row 03/05/22   Eulogio Bear, NP    Family History Family History  Problem Relation Age of Onset   Hyperlipidemia Father    Hypertension Mother    ADD / ADHD Maternal Aunt    Alcohol abuse Maternal Aunt    Drug abuse Maternal Aunt    Anxiety disorder Maternal Aunt    Alcohol abuse Maternal Uncle    Drug abuse Maternal Uncle    Anxiety disorder Paternal Grandfather    Depression Paternal Grandfather    Dementia Paternal Grandmother     Social History Social  History   Tobacco Use   Smoking status: Never   Smokeless tobacco: Never  Vaping Use   Vaping Use: Never used  Substance Use Topics   Alcohol use: No    Alcohol/week: 0.0 standard drinks of alcohol   Drug use: No     Allergies   Penicillins, Amoxicillin, and Doxycycline   Review of Systems Review of Systems Per HPI  Physical Exam Triage Vital Signs ED Triage Vitals  Enc Vitals Group     BP 05/12/22 1657 119/80     Pulse Rate 05/12/22 1657 87     Resp 05/12/22 1657 20     Temp 05/12/22 1657 98.5 F (36.9 C)     Temp src --      SpO2 05/12/22 1657 98 %     Weight --      Height --      Head Circumference --      Peak Flow --      Pain Score 05/12/22 1656 8     Pain Loc --      Pain Edu? --      Excl. in GC? --    No data found.  Updated Vital Signs BP 119/80   Pulse 87   Temp 98.5 F (36.9 C)   Resp 20   SpO2 98%   Visual  Acuity Right Eye Distance:   Left Eye Distance:   Bilateral Distance:    Right Eye Near:   Left Eye Near:    Bilateral Near:     Physical Exam Vitals and nursing note reviewed.  Constitutional:      Appearance: Normal appearance. She is not ill-appearing.  HENT:     Head: Atraumatic.  Eyes:     Extraocular Movements: Extraocular movements intact.     Conjunctiva/sclera: Conjunctivae normal.  Cardiovascular:     Rate and Rhythm: Normal rate and regular rhythm.     Heart sounds: Normal heart sounds.  Pulmonary:     Effort: Pulmonary effort is normal.     Breath sounds: Normal breath sounds.  Musculoskeletal:        General: Swelling, tenderness and signs of injury present. Normal range of motion.     Cervical back: Normal range of motion and neck supple.     Comments: Edema, bruising, significant tenderness to palpation just above the right elbow.  Range of motion intact at the elbow and no bone deformity palpable in the joint  Skin:    General: Skin is warm and dry.     Findings: Bruising present.  Neurological:     Mental Status: She is alert and oriented to person, place, and time.     Comments: Right upper extremity neurovascularly intact  Psychiatric:        Mood and Affect: Mood normal.        Thought Content: Thought content normal.        Judgment: Judgment normal.      UC Treatments / Results  Labs (all labs ordered are listed, but only abnormal results are displayed) Labs Reviewed - No data to display  EKG   Radiology DG Elbow Complete Right  Result Date: 05/12/2022 CLINICAL DATA:  Right elbow injury and swelling. EXAM: RIGHT ELBOW - COMPLETE 3+ VIEW COMPARISON:  Elbow radiograph 01/15/2014 FINDINGS: There is no evidence of fracture, dislocation, or joint effusion. There is no evidence of arthropathy or other focal bone abnormality. Soft tissues are unremarkable. IMPRESSION: Negative. Electronically Signed   By: Vernona Rieger  Beola Cord M.D.   On: 05/12/2022 17:21     Procedures Procedures (including critical care time)  Medications Ordered in UC Medications - No data to display  Initial Impression / Assessment and Plan / UC Course  I have reviewed the triage vital signs and the nursing notes.  Pertinent labs & imaging results that were available during my care of the patient were reviewed by me and considered in my medical decision making (see chart for details).     X-ray of the right elbow negative for acute bony abnormality.  Ace wrap and sling applied for comfort today, discussed respite call, over-the-counter pain relievers.  Return for worsening symptoms.  Final Clinical Impressions(s) / UC Diagnoses   Final diagnoses:  Contusion of right elbow, initial encounter   Discharge Instructions   None    ED Prescriptions   None    PDMP not reviewed this encounter.   Particia Nearing, [place] 05/12/22 [phone]

## 2022-05-12 NOTE — ED Triage Notes (Signed)
Pt presents with right elbow injury and swelling from hitting elbow on furniture Saturday

## 2022-08-09 ENCOUNTER — Encounter: Payer: Self-pay | Admitting: Family Medicine

## 2022-08-09 DIAGNOSIS — F988 Other specified behavioral and emotional disorders with onset usually occurring in childhood and adolescence: Secondary | ICD-10-CM

## 2022-08-09 DIAGNOSIS — F3341 Major depressive disorder, recurrent, in partial remission: Secondary | ICD-10-CM

## 2022-08-12 MED ORDER — BUPROPION HCL ER (XL) 300 MG PO TB24: 300.0000 mg | ORAL_TABLET | Freq: Every day | ORAL | 1 refills | Status: DC

## 2023-02-04 ENCOUNTER — Other Ambulatory Visit: Payer: Self-pay

## 2023-02-04 ENCOUNTER — Ambulatory Visit
Admission: RE | Admit: 2023-02-04 | Discharge: 2023-02-04 | Disposition: A | Discharge status: Home / Self Care | Payer: BC Managed Care – PPO | Source: Ambulatory Visit | Attending: Nurse Practitioner | Admitting: Nurse Practitioner

## 2023-02-04 ENCOUNTER — Ambulatory Visit (INDEPENDENT_AMBULATORY_CARE_PROVIDER_SITE_OTHER): Payer: BC Managed Care – PPO

## 2023-02-04 VITALS — BP 139/86 | HR 79 | Temp 98.3°F | Resp 20

## 2023-02-04 DIAGNOSIS — S63610A Unspecified sprain of right index finger, initial encounter: Secondary | ICD-10-CM

## 2023-02-04 MED ORDER — IBUPROFEN 800 MG PO TABS: 800.0000 mg | ORAL_TABLET | Freq: Three times a day (TID) | ORAL | 0 refills | Status: AC

## 2023-02-04 NOTE — Discharge Instructions (Signed)
The x-ray today does not show any fractures in your finger bone.  I suspect you have sprained your right index finger.  Please wear the finger splint during the day when you are moving/using your hands.  Recommend taking the splint off and icing for 15 minutes on, 45 minutes off every hour for the next 48 hours while awake.  You can take Tylenol 500 to 1000 mg every 6 hours for pain.  You can also use the ibuprofen in between times of the Tylenol is due for pain, just be careful with this medicine because it may make you at higher risk for bleeding with the other medicines that you take.

## 2023-02-04 NOTE — ED Provider Notes (Signed)
[facility]    CSN: [id number] Arrival date & time: 02/04/23  8469      History   Chief Complaint Chief Complaint  Patient presents with   Finger Injury    Entered by patient    HPI Madison Mack is a 27 y.o. female.   Patient reports right finger pain after she was cranking her weed eater yesterday.  Reports her finger got stuck in between the crank.  The finger is now swollen and bruised and is difficult to move.  She endorses decreased range of motion of the finger, however no numbness or tingling.  She also has a tiny cut on the top of her finger.  No fevers or nausea/vomiting.  Has not taken or tried anything for symptoms so far.    Past Medical History:  Diagnosis Date   Allergy    Anxiety    HA (headache)    Nausea & vomiting     Patient Active Problem List   Diagnosis Date Noted   BMI 37.0-37.9, adult 08/29/2019   Intractable vomiting    Barrett's esophagus without dysplasia    Major depressive disorder, recurrent episode, in partial remission (HCC) 09/08/2018   Cyclic vomiting syndrome 09/08/2018   ADD (attention deficit disorder) 09/08/2018    Past Surgical History:  Procedure Laterality Date   ANKLE ARTHODESIS W/ ARTHROSCOPY Right    ANKLE FRACTURE SURGERY Right 04/08   ESOPHAGOGASTRODUODENOSCOPY (EGD) WITH PROPOFOL N/A 08/10/2019   Procedure: ESOPHAGOGASTRODUODENOSCOPY (EGD) WITH PROPOFOL;  Surgeon: Pasty Spillers, MD;  Location: [facility];  Service: Endoscopy;  Laterality: N/A;    OB History     Gravida  2   Para  2   Term  2   Preterm      AB      Living  2      SAB      IAB      Ectopic      Multiple  0   Live Births  2            Home Medications    Prior to Admission medications   Medication Sig Start Date End Date Taking? Authorizing Provider  ibuprofen (ADVIL) 800 MG tablet Take 1 tablet (800 mg total) by mouth 3 (three) times daily. Take with food to prevent GI upset 02/04/23  Yes  Cathlean Marseilles A, NP  buPROPion (WELLBUTRIN XL) 300 MG 24 hr tablet Take 1 tablet (300 mg total) by mouth daily. 08/12/22   Karamalegos, Netta Neat, DO  sertraline (ZOLOFT) 100 MG tablet Take 1 tablet (100 mg total) by mouth daily. 04/09/22   Karamalegos, Netta Neat, DO    Family History Family History  Problem Relation Age of Onset   Hyperlipidemia Father    Hypertension Mother    ADD / ADHD Maternal Aunt    Alcohol abuse Maternal Aunt    Drug abuse Maternal Aunt    Anxiety disorder Maternal Aunt    Alcohol abuse Maternal Uncle    Drug abuse Maternal Uncle    Anxiety disorder Paternal Grandfather    Depression Paternal Grandfather    Dementia Paternal Grandmother     Social History Social History   Tobacco Use   Smoking status: Never   Smokeless tobacco: Never  Vaping Use   Vaping Use: Never used  Substance Use Topics   Alcohol use: No    Alcohol/week: 0.0 standard drinks of alcohol   Drug use: No     Allergies  Penicillins, Amoxicillin, and Doxycycline   Review of Systems Review of Systems Per HPI  Physical Exam Triage Vital Signs ED Triage Vitals  Enc Vitals Group     BP 02/04/23 0913 139/86     Pulse Rate 02/04/23 0913 79     Resp 02/04/23 0913 20     Temp 02/04/23 0913 98.3 F (36.8 C)     Temp Source 02/04/23 0913 Oral     SpO2 02/04/23 0913 98 %     Weight --      Height --      Head Circumference --      Peak Flow --      Pain Score 02/04/23 0911 7     Pain Loc --      Pain Edu? --      Excl. in GC? --    No data found.  Updated Vital Signs BP 139/86 (BP Location: Right Arm)   Pulse 79   Temp 98.3 F (36.8 C) (Oral)   Resp 20   LMP 01/25/2023 (Approximate)   SpO2 98%   Visual Acuity Right Eye Distance:   Left Eye Distance:   Bilateral Distance:    Right Eye Near:   Left Eye Near:    Bilateral Near:     Physical Exam Vitals and nursing note reviewed.  Constitutional:      General: She is not in acute distress.     Appearance: Normal appearance. She is not toxic-appearing.  HENT:     Mouth/Throat:     Mouth: Mucous membranes are moist.     Pharynx: Oropharynx is clear.  Pulmonary:     Effort: Pulmonary effort is normal. No respiratory distress.  Musculoskeletal:     Comments: Inspection: Mild swelling and bruising noted to the right index finger starting at the MCP joint extending to the DIP joint; no obvious deformity, redness, active bleeding.  There is a tiny healing laceration to the dorsal right index finger over the PIP.   Palpation: Right index finger diffusely tender to palpation; no obvious deformities palpated ROM: Difficult to assess secondary to pain Strength: Difficult to assess of right upper extremity secondary to pain Neurovascular: neurovascularly intact in right upper extremity distal to the wound and swelling  Skin:    General: Skin is warm and dry.     Capillary Refill: Capillary refill takes less than 2 seconds.     Coloration: Skin is not jaundiced or pale.     Findings: Bruising present. No erythema.  Neurological:     Mental Status: She is alert and oriented to person, place, and time.  Psychiatric:        Behavior: Behavior is cooperative.      UC Treatments / Results  Labs (all labs ordered are listed, but only abnormal results are displayed) Labs Reviewed - No data to display  EKG   Radiology DG Hand Complete Right  Result Date: 02/04/2023 CLINICAL DATA:  Right index finger pain radiating into the right hand. Hit hand while cranking weed eater yesterday. Pain at second metacarpophalangeal joint. EXAM: RIGHT HAND - COMPLETE 3+ VIEW COMPARISON:  None Available. FINDINGS: Normal bone mineralization. Neutral ulnar variance. The index finger metacarpophalangeal joint space appears within normal limits. There may be mild soft tissue swelling within the proximal aspect of the index finger. The index finger PIP joint is mildly flexed on all views, however no definite  joint space narrowing is seen at the PIP or DIP joint. No acute fracture or  dislocation. IMPRESSION: 1. Normal appearance of the index finger metacarpophalangeal joint. No acute fracture. 2. Possible mild proximal index finger soft tissue swelling. The index finger PIP joint is mildly flexed on all views, however no definite joint space narrowing or other abnormality is seen. Electronically Signed   By: Neita Garnet M.D.   On: 02/04/2023 09:32    Procedures Procedures (including critical care time)  Medications Ordered in UC Medications - No data to display  Initial Impression / Assessment and Plan / UC Course  I have reviewed the triage vital signs and the nursing notes.  Pertinent labs & imaging results that were available during my care of the patient were reviewed by me and considered in my medical decision making (see chart for details).   Patient is well-appearing, normotensive, afebrile, not tachycardic, not tachypneic, oxygenating well on room air.    1. Sprain of right index finger, unspecified site of digit, initial encounter Hand x-ray today is negative for acute fracture - discussed with patient Recommended rest, ice, elevation, finger splint given today Start Tylenol 500 to 1000 mg every 6 hours alternating with ibuprofen 800 mg as needed.  Discussed that ibuprofen may increase bleeding risk when used with sertraline and recommended caution to use.  Patient verbalized understanding.  The patient was given the opportunity to ask questions.  All questions answered to their satisfaction.  The patient is in agreement to this plan.    Final Clinical Impressions(s) / UC Diagnoses   Final diagnoses:  Sprain of right index finger, unspecified site of digit, initial encounter     Discharge Instructions      The x-ray today does not show any fractures in your finger bone.  I suspect you have sprained your right index finger.  Please wear the finger splint during the day when you  are moving/using your hands.  Recommend taking the splint off and icing for 15 minutes on, 45 minutes off every hour for the next 48 hours while awake.  You can take Tylenol 500 to 1000 mg every 6 hours for pain.  You can also use the ibuprofen in between times of the Tylenol is due for pain, just be careful with this medicine because it may make you at higher risk for bleeding with the other medicines that you take.   ED Prescriptions     Medication Sig Dispense Auth. Provider   ibuprofen (ADVIL) 800 MG tablet Take 1 tablet (800 mg total) by mouth 3 (three) times daily. Take with food to prevent GI upset 21 tablet Valentino Nose, NP      PDMP not reviewed this encounter.   Valentino Nose, NP 02/04/23 [phone]

## 2023-02-04 NOTE — ED Triage Notes (Addendum)
Pt reports was trying to crank the weed eater yesterday and reports hit right index finger.states pain ever since. Mild bruise/swelling with small laceration noted to right index finger.

## 2023-02-09 ENCOUNTER — Other Ambulatory Visit: Payer: Self-pay | Admitting: Family Medicine

## 2023-02-09 DIAGNOSIS — F3341 Major depressive disorder, recurrent, in partial remission: Secondary | ICD-10-CM

## 2023-02-09 DIAGNOSIS — F988 Other specified behavioral and emotional disorders with onset usually occurring in childhood and adolescence: Secondary | ICD-10-CM

## 2023-02-10 NOTE — Telephone Encounter (Signed)
Requested Prescriptions  Pending Prescriptions Disp Refills   buPROPion (WELLBUTRIN XL) 300 MG 24 hr tablet [Pharmacy Med Name: buPROPion HCL XL 300 MG TABLET] 90 tablet 1    Sig: TAKE 1 TABLET BY MOUTH DAILY     Psychiatry: Antidepressants - bupropion Failed - 02/09/2023 11:10 AM      Failed - Valid encounter within last 6 months    Recent Outpatient Visits           10 months ago Annual physical exam   [place] [facility] [place], Netta Neat, DO   1 year ago Major depressive disorder, recurrent episode, in partial remission [facility])   [place] [facility] [place], Netta Neat, DO   1 year ago Physical exam, pre-employment   [place] [facility] Smitty Cords, DO   2 years ago Left carpal tunnel syndrome   [place] [facility] Smitty Cords, DO   2 years ago Viral infection   [place] [facility] [place], Jodelle Gross, [place]              Passed - Cr in normal range and within 360 days    Creat  Date Value Ref Range Status  04/09/2022 0.69 0.50 - 0.96 mg/dL Final         Passed - AST in normal range and within 360 days    AST  Date Value Ref Range Status  04/09/2022 10 10 - 30 U/L Final   SGOT(AST)  Date Value Ref Range Status  10/31/2012 15 0 - 26 Unit/L Final         Passed - ALT in normal range and within 360 days    ALT  Date Value Ref Range Status  04/09/2022 12 6 - 29 U/L Final   SGPT (ALT)  Date Value Ref Range Status  10/31/2012 20 12 - 78 U/L Final         Passed - Completed PHQ-2 or PHQ-9 in the last 360 days      Passed - Last BP in normal range    BP Readings from Last 1 Encounters:  02/04/23 139/86

## 2023-02-22 ENCOUNTER — Encounter: Payer: Self-pay | Admitting: Family Medicine

## 2023-03-18 ENCOUNTER — Other Ambulatory Visit: Payer: Self-pay | Admitting: Family Medicine

## 2023-03-18 DIAGNOSIS — F3341 Major depressive disorder, recurrent, in partial remission: Secondary | ICD-10-CM

## 2023-03-18 DIAGNOSIS — F988 Other specified behavioral and emotional disorders with onset usually occurring in childhood and adolescence: Secondary | ICD-10-CM

## 2023-04-08 ENCOUNTER — Telehealth (INDEPENDENT_AMBULATORY_CARE_PROVIDER_SITE_OTHER): Payer: BC Managed Care – PPO | Admitting: Family Medicine

## 2023-04-08 ENCOUNTER — Encounter: Payer: Self-pay | Admitting: Family Medicine

## 2023-04-08 DIAGNOSIS — F3341 Major depressive disorder, recurrent, in partial remission: Secondary | ICD-10-CM

## 2023-04-08 DIAGNOSIS — F988 Other specified behavioral and emotional disorders with onset usually occurring in childhood and adolescence: Secondary | ICD-10-CM | POA: Diagnosis not present

## 2023-04-08 MED ORDER — BUPROPION HCL ER (XL) 300 MG PO TB24: 300.0000 mg | ORAL_TABLET | Freq: Every day | ORAL | 3 refills | Status: DC

## 2023-04-08 MED ORDER — SERTRALINE HCL 100 MG PO TABS: 100.0000 mg | ORAL_TABLET | Freq: Every day | ORAL | 3 refills | Status: DC

## 2023-04-08 NOTE — Assessment & Plan Note (Signed)
Currently doing well with partial remission on depression No longer followed by Psych On SSRI Sertraline 100mg  daily + Wellbutrin XL 300mg  daily

## 2023-04-08 NOTE — Progress Notes (Signed)
Subjective:    Patient ID: Madison Mack, female    DOB: 1996-07-14, 27 y.o.   MRN: [id number]  Madison Mack is a 27 y.o. female presenting on 04/08/2023 for Medical Management of Chronic Issues  Virtual / Telehealth Encounter - Video Visit via MyChart The purpose of this virtual visit is to provide medical care while limiting exposure to the novel coronavirus (COVID19) for both patient and office staff.  Consent was obtained for remote visit:  Yes.   Answered questions that patient had about telehealth interaction:  Yes.   I discussed the limitations, risks, security and privacy concerns of performing an evaluation and management service by video/telephone. I also discussed with the patient that there may be a patient responsible charge related to this service. The patient expressed understanding and agreed to proceed.  Patient Location: Home Provider Location: [facility] (Office)  Participants in virtual visit: - Patient: DEVONY MONACO - CMA: Darrol Angel CMA - Provider: Dr Althea Charon   HPI  Major Depression Recurrent, partial remission Anxiety ADHD  Currently she is doing well. Needs new re order on meds. She is on Sertraline 100mg  daily and Wellbutrin XL 300mg  daily, needs re order today. Missed 4 doses of Wellbutrin  Health Maintenance: Due Flu Shot, when ready. She declines today.  Due Pap Smear, she will reschedule with OBGYN     04/09/2022    9:53 AM 10/04/2021    2:52 PM 03/18/2021    3:55 PM  Depression screen PHQ 2/9  Decreased Interest 1 1 0  Down, Depressed, Hopeless 1 0 1  PHQ - 2 Score 2 1 1   Altered sleeping 0 0 0  Tired, decreased energy 0 1 2  Change in appetite 0 0 0  Feeling bad or failure about yourself  1 0 0  Trouble concentrating 0 0 0  Moving slowly or fidgety/restless 0 0 0  Suicidal thoughts 0 0 0  PHQ-9 Score 3 2 3   Difficult doing work/chores Somewhat difficult Not difficult at all Not difficult at all       04/09/2022    9:53 AM 10/04/2021    2:52 PM 03/18/2021    3:56 PM 04/20/2020    3:00 PM  GAD 7 : Generalized Anxiety Score  Nervous, Anxious, on Edge 2 1 1 1   Control/stop worrying 0 0 0 0  Worry too much - different things 1 0 0 0  Trouble relaxing 0 0 0 0  Restless 1 0 0 0  Easily annoyed or irritable 2 1 1 1   Afraid - awful might happen 0 0 0 0  Total GAD 7 Score 6 2 2 2   Anxiety Difficulty Somewhat difficult Not difficult at all Not difficult at all       Social History   Tobacco Use   Smoking status: Never   Smokeless tobacco: Never  Vaping Use   Vaping status: Never Used  Substance Use Topics   Alcohol use: No    Alcohol/week: 0.0 standard drinks of alcohol   Drug use: No    Review of Systems Per HPI unless specifically indicated above     Objective:    There were no vitals taken for this visit.  Wt Readings from Last 3 Encounters:  04/09/22 250 lb (113.4 kg)  11/13/21 240 lb (108.9 kg)  10/04/21 249 lb (112.9 kg)    Physical Exam  Note examination was completely remotely via video observation objective data only  Gen - well-appearing,  no acute distress or apparent pain, comfortable HEENT - eyes appear clear without discharge or redness Heart/Lungs - cannot examine virtually - observed no evidence of coughing or labored breathing. Abd - cannot examine virtually  Skin - face visible today- no rash Neuro - awake, alert, oriented Psych - not anxious appearing   Results for orders placed or performed in visit on 04/09/22  COMPLETE METABOLIC PANEL WITH GFR  Result Value Ref Range   Glucose, Bld 84 65 - 99 mg/dL   BUN 11 7 - 25 mg/dL   Creat 1.61 0.96 - 0.45 mg/dL   eGFR 409 > OR = 60 WJ/XBJ/4.78G9   BUN/Creatinine Ratio SEE NOTE: 6 - 22 (calc)   Sodium 140 135 - 146 mmol/L   Potassium 4.8 3.5 - 5.3 mmol/L   Chloride 107 98 - 110 mmol/L   CO2 28 20 - 32 mmol/L   Calcium 8.9 8.6 - 10.2 mg/dL   Total Protein 6.7 6.1 - 8.1 g/dL   Albumin 4.0 3.6  - 5.1 g/dL   Globulin 2.7 1.9 - 3.7 g/dL (calc)   AG Ratio 1.5 1.0 - 2.5 (calc)   Total Bilirubin 0.3 0.2 - 1.2 mg/dL   Alkaline phosphatase (APISO) 72 31 - 125 U/L   AST 10 10 - 30 U/L   ALT 12 6 - 29 U/L  CBC with Differential/Platelet  Result Value Ref Range   WBC 6.5 3.8 - 10.8 Thousand/uL   RBC 4.76 3.80 - 5.10 Million/uL   Hemoglobin 14.4 11.7 - 15.5 g/dL   HCT 56.2 13.0 - 86.5 %   MCV 90.1 80.0 - 100.0 fL   MCH 30.3 27.0 - 33.0 pg   MCHC 33.6 32.0 - 36.0 g/dL   RDW 78.4 69.6 - 29.5 %   Platelets 279 140 - 400 Thousand/uL   MPV 10.1 7.5 - 12.5 fL   Neutro Abs 4,024 1,500 - 7,800 cells/uL   Lymphs Abs 1,918 850 - 3,900 cells/uL   Absolute Monocytes 312 200 - 950 cells/uL   Eosinophils Absolute 208 15 - 500 cells/uL   Basophils Absolute 39 0 - 200 cells/uL   Neutrophils Relative % 61.9 %   Total Lymphocyte 29.5 %   Monocytes Relative 4.8 %   Eosinophils Relative 3.2 %   Basophils Relative 0.6 %  Lipid panel  Result Value Ref Range   Cholesterol 202 (H) <200 mg/dL   HDL 48 (L) > OR = 50 mg/dL   Triglycerides 76 <284 mg/dL   LDL Cholesterol (Calc) 137 (H) mg/dL (calc)   Total CHOL/HDL Ratio 4.2 <5.0 (calc)   Non-HDL Cholesterol (Calc) 154 (H) <130 mg/dL (calc)  TSH  Result Value Ref Range   TSH 2.07 mIU/L  Hemoglobin A1c  Result Value Ref Range   Hgb A1c MFr Bld 4.9 <5.7 % of total Hgb   Mean Plasma Glucose 94 mg/dL   eAG (mmol/L) 5.2 mmol/L      Assessment & Plan:   Problem List Items Addressed This Visit     ADD (attention deficit disorder)   Relevant Medications   buPROPion (WELLBUTRIN XL) 300 MG 24 hr tablet   Major depressive disorder, recurrent episode, in partial remission (HCC)    Currently doing well with partial remission on depression No longer followed by Psych On SSRI Sertraline 100mg  daily + Wellbutrin XL 300mg  daily      Relevant Medications   sertraline (ZOLOFT) 100 MG tablet   buPROPion (WELLBUTRIN XL) 300 MG 24 hr tablet  Advised  that we can defer labs until next year, based on prior results.   Meds ordered this encounter  Medications   sertraline (ZOLOFT) 100 MG tablet    Sig: Take 1 tablet (100 mg total) by mouth daily.    Dispense:  90 tablet    Refill:  3   buPROPion (WELLBUTRIN XL) 300 MG 24 hr tablet    Sig: Take 1 tablet (300 mg total) by mouth daily.    Dispense:  90 tablet    Refill:  3      Follow up plan: Return in about 1 year (around 04/07/2024) for 1 year for annual physical.   Patient verbalizes understanding with the above medical recommendations including the limitation of remote medical advice.  Specific follow-up and call-back criteria were given for patient to follow-up or seek medical care more urgently if needed.  Total duration of direct patient care provided via video conference: 10 minutes   Saralyn Pilar, DO [facility] 04/08/2023, 4:42 PM

## 2023-04-08 NOTE — Patient Instructions (Addendum)
Thank you for coming to the office today.  Medications have been re ordered to your pharmacy for 90 day + 3 refills.  Please schedule with [facility] for your yearly well woman exam and pap smear  [facility] [address] [place],  [place]  [address] Main: [phone]  Please schedule a Follow-up Appointment to: Return in about 1 year (around 04/07/2024) for 1 year for annual physical.  If you have any other questions or concerns, please feel free to call the office or send a message through MyChart. You may also schedule an earlier appointment if necessary.  Additionally, you may be receiving a survey about your experience at our office within a few days to 1 week by e-mail or mail. We value your feedback.  Saralyn Pilar, DO [facility], [place]

## 2023-06-13 ENCOUNTER — Ambulatory Visit: Payer: Self-pay

## 2023-08-20 ENCOUNTER — Ambulatory Visit
Admission: RE | Admit: 2023-08-20 | Discharge: 2023-08-20 | Disposition: A | Discharge status: Home / Self Care | Payer: 59 | Source: Ambulatory Visit | Attending: Nurse Practitioner | Admitting: Nurse Practitioner

## 2023-08-20 VITALS — BP 146/100 | HR 113 | Temp 99.1°F | Resp 18

## 2023-08-20 DIAGNOSIS — L509 Urticaria, unspecified: Secondary | ICD-10-CM

## 2023-08-20 MED ORDER — METHYLPREDNISOLONE ACETATE 40 MG/ML IJ SUSP
40.0000 mg | Freq: Once | INTRAMUSCULAR | Status: AC
  Administered 2023-08-20: 40 mg via INTRAMUSCULAR

## 2023-08-20 NOTE — Discharge Instructions (Addendum)
 We gave you an injection of steroid medicine today to help with allergic reaction/itching.  Recommend start oral cetirizine 10 mg up to twice daily or loratadine 10 mg up to twice daily, fexofenadine 180 mg daily.  Follow up with Allergist for allergy testing.  Contact information has been provided.

## 2023-08-20 NOTE — ED Triage Notes (Signed)
 Has been breaking out in hives x several months.  States it happens at random times.  has been taking benadryl

## 2023-08-20 NOTE — ED Provider Notes (Signed)
 [facility]    CSN: [id number] Arrival date & time: 08/20/23  1258      History   Chief Complaint Chief Complaint  Patient presents with   Allergic Reaction    Full body hives, I've been dealing with these off and on for a few months. Usually I can take Benadryl  and they will subside, but this recent hive outbreak it seems to just keep getting worse. - Entered by patient    HPI Madison Mack is a 28 y.o. female.   Patient presents today for approximately 8 to 9 months of intermittent hives.  She reports raised, red rash that starts in various parts of her body including hands, arms, face, soles of her feet, and scalp.  Reports the rash is intensely itchy.  Thinks rash may be attributed to stress as she did not have any hives over Christmas break and they began again the night prior to returning to work.  Has been taking Benadryl  and trying over-the-counter creams and prescription creams for the rash without significant improvement.  Denies recent change in any detergents, soaps, personal care products.  Has not been able to determine a food that seems to cause the rash.  No shortness of breath or throat/tongue swelling.    Past Medical History:  Diagnosis Date   Allergy    Anxiety    HA (headache)    Nausea & vomiting     Patient Active Problem List   Diagnosis Date Noted   BMI 37.0-37.9, adult 08/29/2019   Intractable vomiting    Barrett's esophagus without dysplasia    Major depressive disorder, recurrent episode, in partial remission (HCC) 09/08/2018   Cyclic vomiting syndrome 09/08/2018   ADD (attention deficit disorder) 09/08/2018    Past Surgical History:  Procedure Laterality Date   ANKLE ARTHODESIS W/ ARTHROSCOPY Right    ANKLE FRACTURE SURGERY Right 04/08   ESOPHAGOGASTRODUODENOSCOPY (EGD) WITH PROPOFOL  N/A 08/10/2019   Procedure: ESOPHAGOGASTRODUODENOSCOPY (EGD) WITH PROPOFOL ;  Surgeon: Janalyn Keene NOVAK, MD;  Location: [facility];   Service: Endoscopy;  Laterality: N/A;    OB History     Gravida  2   Para  2   Term  2   Preterm      AB      Living  2      SAB      IAB      Ectopic      Multiple  0   Live Births  2            Home Medications    Prior to Admission medications   Medication Sig Start Date End Date Taking? Authorizing Provider  buPROPion  (WELLBUTRIN  XL) 300 MG 24 hr tablet Take 1 tablet (300 mg total) by mouth daily. 04/08/23   Karamalegos, Marsa PARAS, DO  ibuprofen  (ADVIL ) 800 MG tablet Take 1 tablet (800 mg total) by mouth 3 (three) times daily. Take with food to prevent GI upset 02/04/23   Chandra Harlene LABOR, NP  sertraline  (ZOLOFT ) 100 MG tablet Take 1 tablet (100 mg total) by mouth daily. 04/08/23   Edman Marsa PARAS, DO    Family History Family History  Problem Relation Age of Onset   Hyperlipidemia Father    Hypertension Mother    ADD / ADHD Maternal Aunt    Alcohol abuse Maternal Aunt    Drug abuse Maternal Aunt    Anxiety disorder Maternal Aunt    Alcohol abuse Maternal Uncle    Drug abuse  Maternal Uncle    Anxiety disorder Paternal Grandfather    Depression Paternal Grandfather    Dementia Paternal Grandmother     Social History Social History   Tobacco Use   Smoking status: Never   Smokeless tobacco: Never  Vaping Use   Vaping status: Never Used  Substance Use Topics   Alcohol use: No    Alcohol/week: 0.0 standard drinks of alcohol   Drug use: No     Allergies   Penicillins, Amoxicillin, and Doxycycline   Review of Systems Review of Systems Per HPI  Physical Exam Triage Vital Signs ED Triage Vitals  Encounter Vitals Group     BP 08/20/23 1306 (!) 146/100     Systolic BP Percentile --      Diastolic BP Percentile --      Pulse Rate 08/20/23 1306 (!) 113     Resp 08/20/23 1306 18     Temp 08/20/23 1306 99.1 F (37.3 C)     Temp Source 08/20/23 1306 Oral     SpO2 08/20/23 1306 95 %     Weight --      Height --      Head  Circumference --      Peak Flow --      Pain Score 08/20/23 1307 0     Pain Loc --      Pain Education --      Exclude from Growth Chart --    No data found.  Updated Vital Signs BP (!) 146/100 (BP Location: Right Arm)   Pulse (!) 113   Temp 99.1 F (37.3 C) (Oral)   Resp 18   SpO2 95%   HR recheck: 95  Visual Acuity Right Eye Distance:   Left Eye Distance:   Bilateral Distance:    Right Eye Near:   Left Eye Near:    Bilateral Near:     Physical Exam Vitals and nursing note reviewed.  Constitutional:      General: She is not in acute distress.    Appearance: Normal appearance. She is not toxic-appearing.  HENT:     Mouth/Throat:     Mouth: Mucous membranes are moist.     Pharynx: Oropharynx is clear.  Pulmonary:     Effort: Pulmonary effort is normal. No respiratory distress.  Skin:    General: Skin is warm and dry.     Capillary Refill: Capillary refill takes less than 2 seconds.     Findings: Erythema and rash present.     Comments: Raised, erythematous rash with multiple, distinct plaques noted to left dorsal hand.  Neurological:     Mental Status: She is alert and oriented to person, place, and time.  Psychiatric:        Behavior: Behavior is cooperative.      UC Treatments / Results  Labs (all labs ordered are listed, but only abnormal results are displayed) Labs Reviewed - No data to display  EKG   Radiology No results found.  Procedures Procedures (including critical care time)  Medications Ordered in UC Medications  methylPREDNISolone  acetate (DEPO-MEDROL ) injection 40 mg (40 mg Intramuscular Given 08/20/23 1326)    Initial Impression / Assessment and Plan / UC Course  I have reviewed the triage vital signs and the nursing notes.  Pertinent labs & imaging results that were available during my care of the patient were reviewed by me and considered in my medical decision making (see chart for details).   Patient is well-appearing,  afebrile,  not tachycardic, not tachypneic, oxygenating well on room air.  Patient is mildly hypertensive in urgent care today.  Tachycardia improved with rest.  1. Urticaria Depo-Medrol  40 mg IM given in urgent care today for itching Recommended first generation oral antihistamine like cetirizine, or loratadine up to twice daily as needed; fexofenadine daily as needed Recommended follow-up with allergist and contact formation provided  The patient was given the opportunity to ask questions.  All questions answered to their satisfaction.  The patient is in agreement to this plan.    Final Clinical Impressions(s) / UC Diagnoses   Final diagnoses:  Urticaria     Discharge Instructions      We gave you an injection of steroid medicine today to help with allergic reaction/itching.  Recommend start oral cetirizine 10 mg up to twice daily or loratadine 10 mg up to twice daily, fexofenadine 180 mg daily.  Follow up with Allergist for allergy testing.  Contact information has been provided.    ED Prescriptions   None    PDMP not reviewed this encounter.   Chandra Harlene LABOR, NP 08/20/23 1350

## 2023-09-22 ENCOUNTER — Ambulatory Visit: Payer: 59 | Admitting: Obstetrics

## 2023-10-01 IMAGING — DX DG SHOULDER 2+V*L*
5 series · 5 of 5 positions shown · non-contrast
Comparison: None.

CLINICAL DATA: Fell and injured left shoulder.

EXAM:
LEFT SHOULDER - 2+ VIEW

[shoulder internal rotation ap]
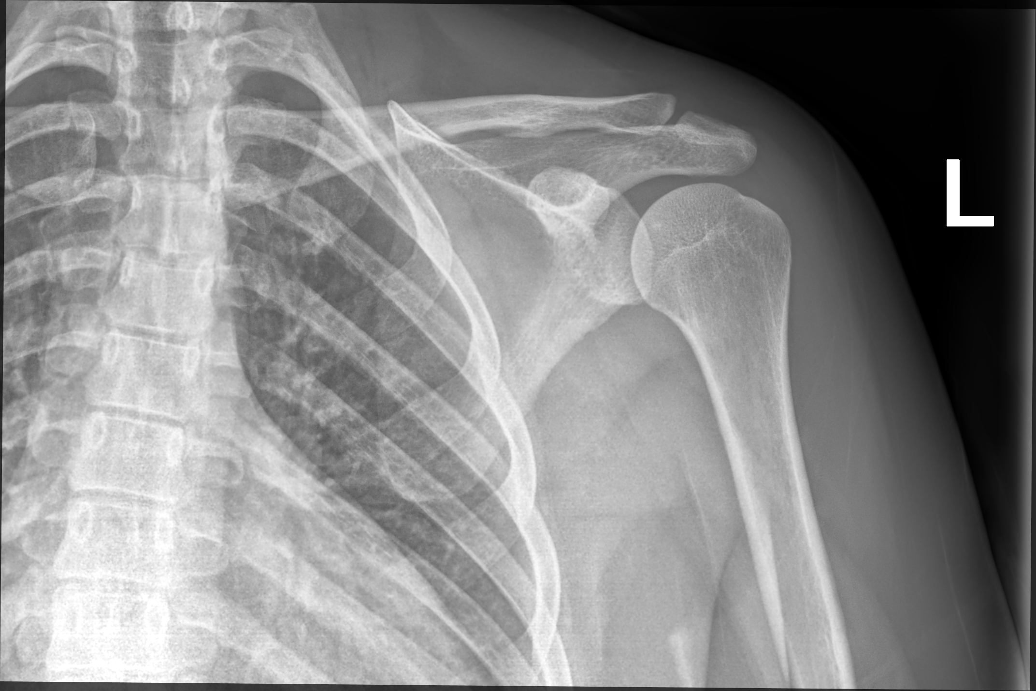

[shoulder external rotation ap]
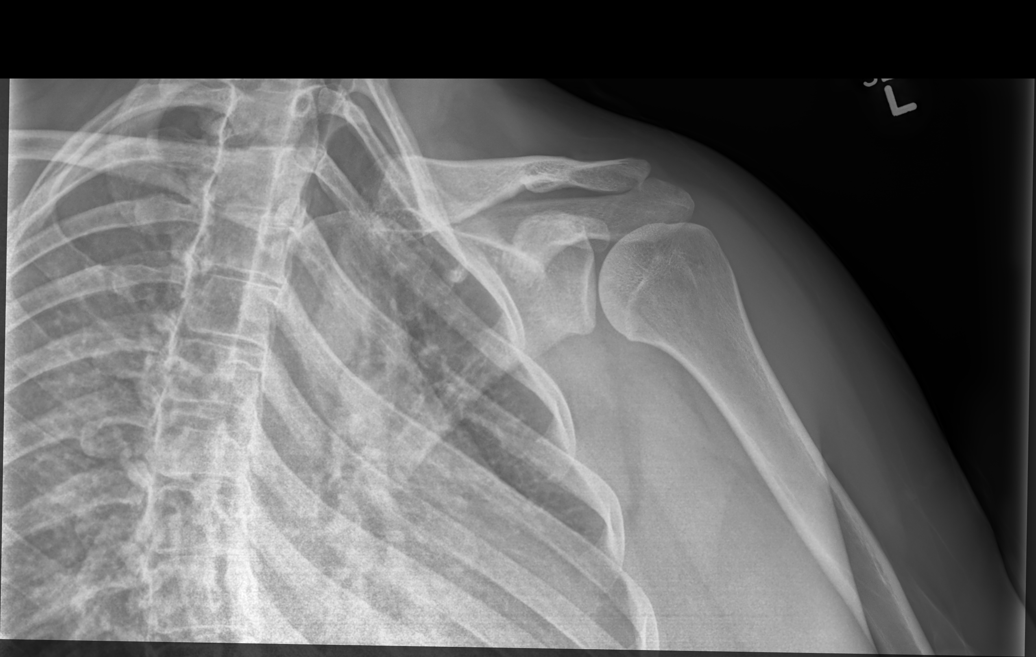

[shoulder (y view) lat (1 of 2)]
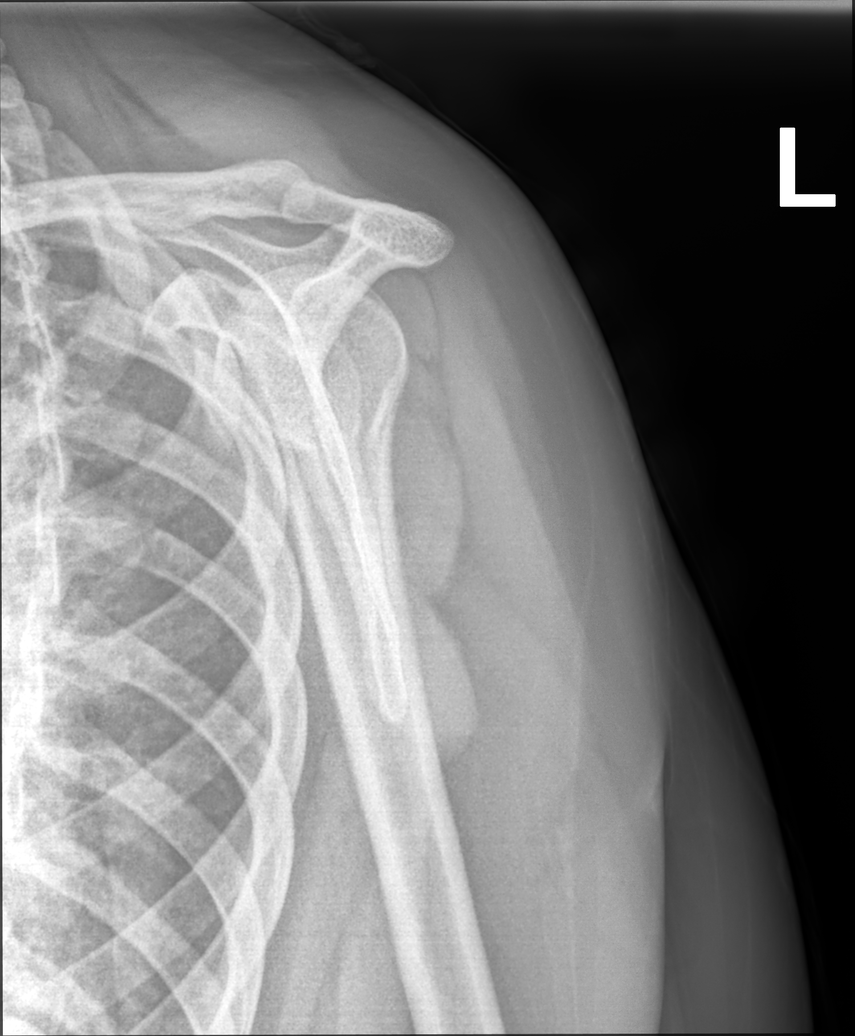

[shoulder (y view) lat (2 of 2)]
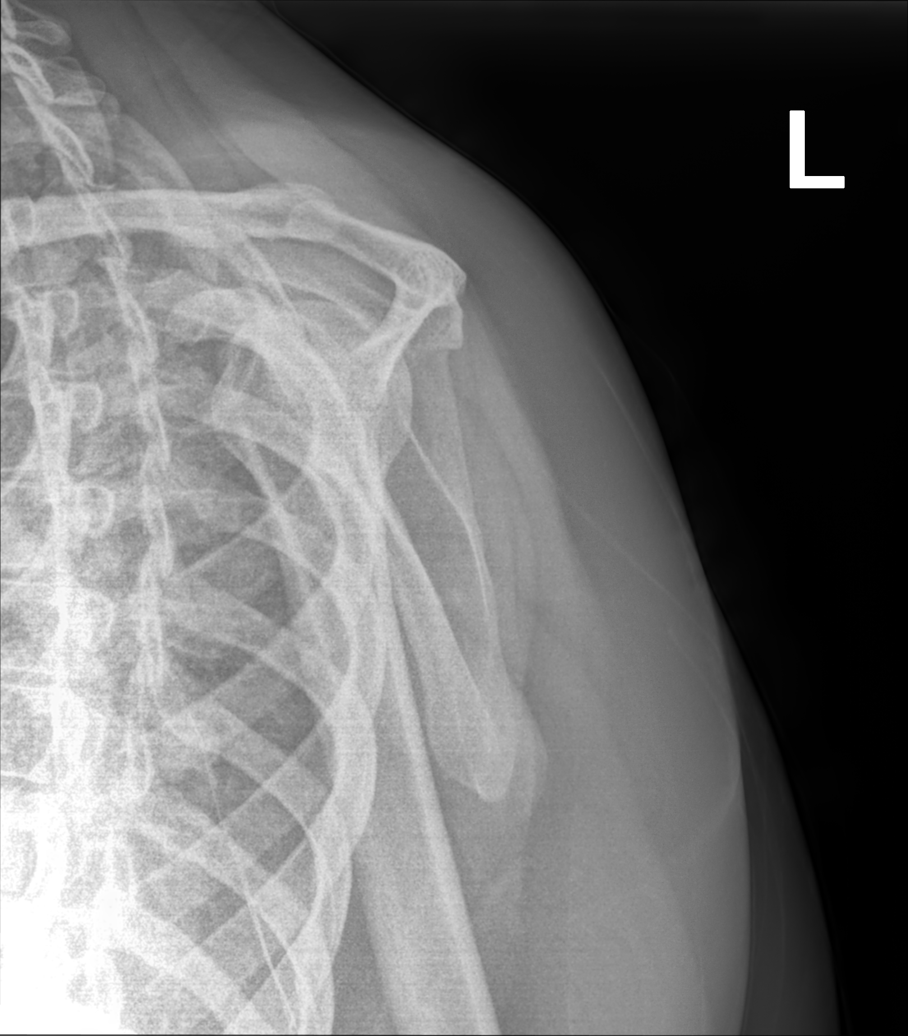

[scapula ap]
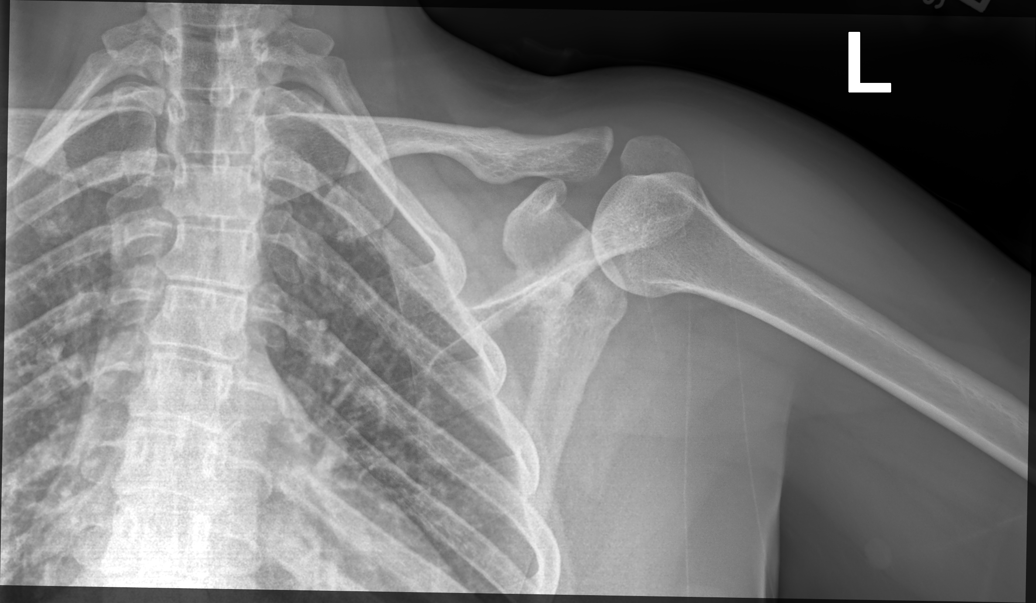

[5 of 5 positions shown; findings below may reference images not displayed]

FINDINGS: The glenohumeral and AC joints are maintained. No acute fracture. No
abnormal soft tissue calcification. No upper left rib fractures. The
visualized left lung is grossly clear.
IMPRESSION: No acute bony findings.

## 2023-10-08 ENCOUNTER — Ambulatory Visit (INDEPENDENT_AMBULATORY_CARE_PROVIDER_SITE_OTHER): Payer: 59 | Admitting: Obstetrics

## 2023-10-08 ENCOUNTER — Encounter: Payer: Self-pay | Admitting: Obstetrics

## 2023-10-08 ENCOUNTER — Other Ambulatory Visit (HOSPITAL_COMMUNITY)
Admission: RE | Admit: 2023-10-08 | Discharge: 2023-10-08 | Disposition: A | Discharge status: Home / Self Care | Source: Ambulatory Visit | Attending: Obstetrics | Admitting: Obstetrics

## 2023-10-08 VITALS — BP 133/85 | HR 89 | Ht 69.0 in | Wt 248.0 lb

## 2023-10-08 DIAGNOSIS — Z124 Encounter for screening for malignant neoplasm of cervix: Secondary | ICD-10-CM | POA: Diagnosis present

## 2023-10-08 DIAGNOSIS — Z3009 Encounter for other general counseling and advice on contraception: Secondary | ICD-10-CM | POA: Insufficient documentation

## 2023-10-08 DIAGNOSIS — Z30432 Encounter for removal of intrauterine contraceptive device: Secondary | ICD-10-CM | POA: Insufficient documentation

## 2023-10-08 MED ORDER — NORETHIN ACE-ETH ESTRAD-FE 1-20 MG-MCG PO TABS: 1.0000 | ORAL_TABLET | Freq: Every day | ORAL | 3 refills | Status: DC

## 2023-10-08 NOTE — Progress Notes (Signed)
 Subjective:    Madison Mack is a 28 y.o. female who presents for IUD removal and contraception counseling. She and her partner both feel the IUD strings and it is uncomfortable. She is ready for a different contraceptive method and would like an oral contraceptive. Pertinent past medical history: migraines without aura. She is also due for a Pap smear.  Menstrual History: OB History     Gravida  2   Para  2   Term  2   Preterm      AB      Living  2      SAB      IAB      Ectopic      Multiple  0   Live Births  2           No LMP recorded. (Menstrual status: IUD).    The following portions of the patient's history were reviewed and updated as appropriate: allergies, current medications, past medical history, and problem list.  Review of Systems Pertinent items are noted in HPI.   Objective:   Pelvic exam: Normal external genitalia. Cervix friable; no lesions or discharge. IUD strings visible. Pap collected.  Procedure Note Cervix visualized with speculum. IUD strings grasped with ring forceps and removed easily with gentle traction.  Assessment:    -28 y.o., discontinuing IUD and starting COCs, no contraindications.  -Due for Pap smear  Plan:    - Discussed available oral contraceptives. Madison Mack would like to try a COC. Discussed risks, benefits, proper administation, when to start, common side effects. Instructed to use back up method for 2 weeks. -Pap collected. F/u based on results.  Return to care for annual visit or PRN.  Glenetta Borg, CNM

## 2023-10-13 ENCOUNTER — Encounter: Payer: Self-pay | Admitting: Obstetrics

## 2023-10-13 LAB — CYTOLOGY - PAP: Diagnosis: NEGATIVE

## 2024-03-31 ENCOUNTER — Other Ambulatory Visit: Payer: Self-pay | Admitting: Family Medicine

## 2024-03-31 DIAGNOSIS — F988 Other specified behavioral and emotional disorders with onset usually occurring in childhood and adolescence: Secondary | ICD-10-CM

## 2024-03-31 DIAGNOSIS — F3341 Major depressive disorder, recurrent, in partial remission: Secondary | ICD-10-CM

## 2024-04-01 NOTE — Telephone Encounter (Signed)
 Too soon for refill, LRF 04/08/23 for 90 and 3 RF.OFFICE VISIT NEEDED FOR ADDITIONAL REFILLS.  Requested Prescriptions  Pending Prescriptions Disp Refills   buPROPion  (WELLBUTRIN  XL) 300 MG 24 hr tablet [Pharmacy Med Name: buPROPion  HCL XL 300 MG TABLET] 90 tablet 3    Sig: TAKE 1 TABLET BY MOUTH DAILY     Psychiatry: Antidepressants - bupropion  Failed - 04/01/2024  9:09 AM      Failed - Cr in normal range and within 360 days    Creat  Date Value Ref Range Status  04/09/2022 0.69 0.50 - 0.96 mg/dL Final         Failed - AST in normal range and within 360 days    AST  Date Value Ref Range Status  04/09/2022 10 10 - 30 U/L Final   SGOT(AST)  Date Value Ref Range Status  10/31/2012 15 0 - 26 Unit/L Final         Failed - ALT in normal range and within 360 days    ALT  Date Value Ref Range Status  04/09/2022 12 6 - 29 U/L Final   SGPT (ALT)  Date Value Ref Range Status  10/31/2012 20 12 - 78 U/L Final         Failed - Completed PHQ-2 or PHQ-9 in the last 360 days      Failed - Valid encounter within last 6 months    Recent Outpatient Visits   None            Passed - Last BP in normal range    BP Readings from Last 1 Encounters:  10/08/23 133/85          sertraline  (ZOLOFT ) 100 MG tablet [Pharmacy Med Name: SERTRALINE  HCL 100 MG TABLET] 90 tablet 3    Sig: TAKE 1 TABLET BY MOUTH DAILY     Psychiatry:  Antidepressants - SSRI - sertraline  Failed - 04/01/2024  9:09 AM      Failed - AST in normal range and within 360 days    AST  Date Value Ref Range Status  04/09/2022 10 10 - 30 U/L Final   SGOT(AST)  Date Value Ref Range Status  10/31/2012 15 0 - 26 Unit/L Final         Failed - ALT in normal range and within 360 days    ALT  Date Value Ref Range Status  04/09/2022 12 6 - 29 U/L Final   SGPT (ALT)  Date Value Ref Range Status  10/31/2012 20 12 - 78 U/L Final         Failed - Completed PHQ-2 or PHQ-9 in the last 360 days      Failed - Valid  encounter within last 6 months    Recent Outpatient Visits   None

## 2024-04-09 ENCOUNTER — Other Ambulatory Visit: Payer: Self-pay | Admitting: Family Medicine

## 2024-04-09 DIAGNOSIS — F988 Other specified behavioral and emotional disorders with onset usually occurring in childhood and adolescence: Secondary | ICD-10-CM

## 2024-04-09 DIAGNOSIS — F3341 Major depressive disorder, recurrent, in partial remission: Secondary | ICD-10-CM

## 2024-04-12 ENCOUNTER — Other Ambulatory Visit: Payer: Self-pay

## 2024-04-12 DIAGNOSIS — F3341 Major depressive disorder, recurrent, in partial remission: Secondary | ICD-10-CM

## 2024-04-12 MED ORDER — SERTRALINE HCL 100 MG PO TABS: 100.0000 mg | ORAL_TABLET | Freq: Every day | ORAL | 3 refills | Status: AC

## 2024-04-12 NOTE — Telephone Encounter (Signed)
 Attempted to call patient to schedule appointment- left message to call office- courtesy refill 30 day given.  Requested Prescriptions  Pending Prescriptions Disp Refills   buPROPion  (WELLBUTRIN  XL) 300 MG 24 hr tablet [Pharmacy Med Name: buPROPion  HCL XL 300 MG TABLET] 30 tablet 0    Sig: TAKE 1 TABLET BY MOUTH DAILY     Psychiatry: Antidepressants - bupropion  Failed - 04/12/2024 10:26 AM      Failed - Cr in normal range and within 360 days    Creat  Date Value Ref Range Status  04/09/2022 0.69 0.50 - 0.96 mg/dL Final         Failed - AST in normal range and within 360 days    AST  Date Value Ref Range Status  04/09/2022 10 10 - 30 U/L Final   SGOT(AST)  Date Value Ref Range Status  10/31/2012 15 0 - 26 Unit/L Final         Failed - ALT in normal range and within 360 days    ALT  Date Value Ref Range Status  04/09/2022 12 6 - 29 U/L Final   SGPT (ALT)  Date Value Ref Range Status  10/31/2012 20 12 - 78 U/L Final         Failed - Completed PHQ-2 or PHQ-9 in the last 360 days      Failed - Valid encounter within last 6 months    Recent Outpatient Visits   None            Passed - Last BP in normal range    BP Readings from Last 1 Encounters:  10/08/23 133/85         Refused Prescriptions Disp Refills   sertraline  (ZOLOFT ) 100 MG tablet [Pharmacy Med Name: SERTRALINE  HCL 100 MG TABLET] 90 tablet 3    Sig: TAKE 1 TABLET BY MOUTH DAILY     Psychiatry:  Antidepressants - SSRI - sertraline  Failed - 04/12/2024 10:26 AM      Failed - AST in normal range and within 360 days    AST  Date Value Ref Range Status  04/09/2022 10 10 - 30 U/L Final   SGOT(AST)  Date Value Ref Range Status  10/31/2012 15 0 - 26 Unit/L Final         Failed - ALT in normal range and within 360 days    ALT  Date Value Ref Range Status  04/09/2022 12 6 - 29 U/L Final   SGPT (ALT)  Date Value Ref Range Status  10/31/2012 20 12 - 78 U/L Final         Failed - Completed PHQ-2 or PHQ-9  in the last 360 days      Failed - Valid encounter within last 6 months    Recent Outpatient Visits   None

## 2024-04-28 ENCOUNTER — Telehealth (INDEPENDENT_AMBULATORY_CARE_PROVIDER_SITE_OTHER): Admitting: Family Medicine

## 2024-04-28 DIAGNOSIS — F3341 Major depressive disorder, recurrent, in partial remission: Secondary | ICD-10-CM | POA: Diagnosis not present

## 2024-04-28 DIAGNOSIS — F988 Other specified behavioral and emotional disorders with onset usually occurring in childhood and adolescence: Secondary | ICD-10-CM | POA: Diagnosis not present

## 2024-04-28 DIAGNOSIS — L508 Other urticaria: Secondary | ICD-10-CM | POA: Diagnosis not present

## 2024-04-28 MED ORDER — BUPROPION HCL ER (XL) 300 MG PO TB24: 300.0000 mg | ORAL_TABLET | Freq: Every day | ORAL | 3 refills | Status: AC

## 2024-04-28 NOTE — Patient Instructions (Signed)
   Please schedule a Follow-up Appointment to: No follow-ups on file.  If you have any other questions or concerns, please feel free to call the office or send a message through MyChart. You may also schedule an earlier appointment if necessary.  Additionally, you may be receiving a survey about your experience at our office within a few days to 1 week by e-mail or mail. We value your feedback.  Saralyn Pilar, DO [facility], [place]

## 2024-04-28 NOTE — Progress Notes (Signed)
 Subjective:    Patient ID: Madison Mack, female    DOB: September 28, 1995, 28 y.o.   MRN: [id number]  Madison Mack is a 28 y.o. female presenting on 04/28/2024 for Depression and Urticaria   Virtual / Telehealth Encounter - Video Visit via MyChart The purpose of this virtual visit is to provide medical care while limiting exposure to the novel coronavirus (COVID19) for both patient and office staff.  Consent was obtained for remote visit:  Yes.   Answered questions that patient had about telehealth interaction:  Yes.   I discussed the limitations, risks, security and privacy concerns of performing an evaluation and management service by video/telephone. I also discussed with the patient that there may be a patient responsible charge related to this service. The patient expressed understanding and agreed to proceed.  Patient Location: Work Provider Location: [employer] (Office)  Participants in virtual visit: - Patient: [name] - CMA: Alan Fontana CMA - Provider: Dr Edman   HPI  Discussed the use of AI scribe software for clinical note transcription with the patient, who gave verbal consent to proceed.  History of Present Illness   Madison Mack is a 28 year old female who presents for a medication refill update and to discuss worsening carpal tunnel syndrome and recurrent hives.  Carpal Tunnel, Bilateral Worsening hand paresthesia and cramping - Worsening symptoms of carpal tunnel syndrome, particularly with activities requiring fine motor skills  - Cramping in both hands after repetitive pinching for more than 3-5 minutes - Previous treatments include ibuprofen , splints, and braces Past work up with Dr Hardin Czar at [facility], hand specialist, [phone]. - Underwent nerve testing at [facility] in past. - Received an injection previously, which provided temporary relief  Recurrent urticaria - Recurrent hives associated with  stress and emotional triggers, especially during job as Runner, broadcasting/film/video. - Hives appear on face, lips, mouth, hands, and feet - Described as 'very annoying' but not harmful - Benadryl  provides inconsistent relief - No prior allergy testing. She does not attribute any symptoms to seasonal or environmental allergens or food allergy - History of sensitive skin and tendency to break out easily  Medication management - Recently refilled sertraline  100 mg (90-day supply with refills) and bupropion  XL 300 mg (30-day supply with one refill) - she needs 90 day updated on Bupropion  - Takes Hailey Fe for contraception          04/28/2024    5:18 PM 04/09/2022    9:53 AM 10/04/2021    2:52 PM  Depression screen PHQ 2/9  Decreased Interest 1 1 1   Down, Depressed, Hopeless 1 1 0  PHQ - 2 Score 2 2 1   Altered sleeping 0 0 0  Tired, decreased energy 0 0 1  Change in appetite 0 0 0  Feeling bad or failure about yourself  1 1 0  Trouble concentrating 0 0 0  Moving slowly or fidgety/restless 0 0 0  Suicidal thoughts 0 0 0  PHQ-9 Score 3 3 2   Difficult doing work/chores Not difficult at all Somewhat difficult Not difficult at all       04/09/2022    9:53 AM 10/04/2021    2:52 PM 03/18/2021    3:56 PM 04/20/2020    3:00 PM  GAD 7 : Generalized Anxiety Score  Nervous, Anxious, on Edge 2 1 1 1   Control/stop worrying 0 0 0 0  Worry too much - different things 1 0 0 0  Trouble relaxing 0 0 0 0  Restless 1 0 0 0  Easily annoyed or irritable 2 1 1 1   Afraid - awful might happen 0 0 0 0  Total GAD 7 Score 6 2 2 2   Anxiety Difficulty Somewhat difficult Not difficult at all Not difficult at all     Social History   Tobacco Use   Smoking status: Never   Smokeless tobacco: Never  Vaping Use   Vaping status: Never Used  Substance Use Topics   Alcohol use: No    Alcohol/week: 0.0 standard drinks of alcohol   Drug use: No    Review of Systems Per HPI unless specifically indicated above      Objective:    There were no vitals taken for this visit.  Wt Readings from Last 3 Encounters:  10/08/23 248 lb (112.5 kg)  04/09/22 250 lb (113.4 kg)  11/13/21 240 lb (108.9 kg)     Physical Exam  Note examination was completely remotely via video observation objective data only  Gen - well-appearing, no acute distress or apparent pain, comfortable HEENT - eyes appear clear without discharge or redness Heart/Lungs - cannot examine virtually - observed no evidence of coughing or labored breathing. Abd - cannot examine virtually  Skin - face visible today- no rash Neuro - awake, alert, oriented Psych - not anxious appearing   Results for orders placed or performed in visit on 10/08/23  Cytology - PAP   Collection Time: 10/08/23  4:20 PM  Result Value Ref Range   Adequacy      Satisfactory for evaluation; transformation zone component PRESENT.   Diagnosis      - Negative for intraepithelial lesion or malignancy (NILM)      Assessment & Plan:   Problem List Items Addressed This Visit     Major depressive disorder, recurrent episode, in partial remission (HCC)   Relevant Medications   buPROPion  (WELLBUTRIN  XL) 300 MG 24 hr tablet   Other Visit Diagnoses       Recurrent urticaria    -  Primary   Relevant Orders   Ambulatory referral to Allergy     Attention deficit disorder (ADD) without hyperactivity       Relevant Medications   buPROPion  (WELLBUTRIN  XL) 300 MG 24 hr tablet        Chronic recurrent urticaria Chronic urticaria exacerbated by stress and emotional triggers. Benadryl  provides inconsistent relief. - Refer to allergist for evaluation and potential allergy testing. - Discussed daily antihistamines such as Claritin, Zyrtec, or Allegra for prevention. Also H2 blocker Pepcid  - Consider Singulair for additional prevention, but she opted to wait. - Consider short-term steroid use for acute flare-ups, not recommended for long-term management.  Bilateral  carpal tunnel syndrome Bilateral carpal tunnel syndrome with worsening symptoms. Previous evaluation and management by Dr. Francisco. Hand specialist ([facility]) - Continue use of brace and anti-inflammatories as needed. We discussed limited other options for chronic carpal tunnel , goal is avoidance of provoking symptoms. Years prior her flare was triggered by pregnancy - Consider referral back to Dr. Francisco if symptoms do not improve or worsen.  Major Depression Recurrent Partial Remission Depression managed with sertraline  and bupropion . - Update bupropion  XL 300 mg prescription to 90-day supply with refills.  - Continue existing Sertraline  100mg  daily      Orders Placed This Encounter  Procedures   Ambulatory referral to Allergy    Referral Priority:   Routine    Referral Type:  Allergy Testing    Referral Reason:   Specialty Services Required    Requested Specialty:   Allergy    Number of Visits Requested:   1    Meds ordered this encounter  Medications   buPROPion  (WELLBUTRIN  XL) 300 MG 24 hr tablet    Sig: Take 1 tablet (300 mg total) by mouth daily.    Dispense:  90 tablet    Refill:  3    Please change to 90 day and add refills for future    Follow up plan: Return if symptoms worsen or fail to improve.   Patient verbalizes understanding with the above medical recommendations including the limitation of remote medical advice.  Specific follow-up and call-back criteria were given for patient to follow-up or seek medical care more urgently if needed.  Total duration of direct patient care provided via video conference: 15 minutes   Marsa Officer, DO [facility] 04/28/2024, 10:48 AM

## 2024-05-27 ENCOUNTER — Other Ambulatory Visit: Payer: Self-pay | Admitting: Family Medicine

## 2024-05-27 DIAGNOSIS — F988 Other specified behavioral and emotional disorders with onset usually occurring in childhood and adolescence: Secondary | ICD-10-CM

## 2024-05-27 DIAGNOSIS — F3341 Major depressive disorder, recurrent, in partial remission: Secondary | ICD-10-CM

## 2024-05-30 NOTE — Telephone Encounter (Signed)
 Too soon for refill.  Requested Prescriptions  Pending Prescriptions Disp Refills   buPROPion  (WELLBUTRIN  XL) 300 MG 24 hr tablet [Pharmacy Med Name: buPROPion  HCL XL 300 MG TABLET] 30 tablet     Sig: TAKE 1 TABLET BY MOUTH DAILY     Psychiatry: Antidepressants - bupropion  Failed - 05/30/2024 12:35 PM      Failed - Cr in normal range and within 360 days    Creat  Date Value Ref Range Status  04/09/2022 0.69 0.50 - 0.96 mg/dL Final         Failed - AST in normal range and within 360 days    AST  Date Value Ref Range Status  04/09/2022 10 10 - 30 U/L Final   SGOT(AST)  Date Value Ref Range Status  10/31/2012 15 0 - 26 Unit/L Final         Failed - ALT in normal range and within 360 days    ALT  Date Value Ref Range Status  04/09/2022 12 6 - 29 U/L Final   SGPT (ALT)  Date Value Ref Range Status  10/31/2012 20 12 - 78 U/L Final         Passed - Completed PHQ-2 or PHQ-9 in the last 360 days      Passed - Last BP in normal range    BP Readings from Last 1 Encounters:  10/08/23 133/85         Passed - Valid encounter within last 6 months    Recent Outpatient Visits           1 month ago Recurrent urticaria   [facility] [place], Marsa PARAS, DO

## 2024-09-06 ENCOUNTER — Other Ambulatory Visit: Payer: Self-pay | Admitting: Obstetrics
